# Patient Record
Sex: Female | Born: 1965
Health system: Southern US, Community
[De-identification: ages and names within clinical notes are randomized; demographics above are authoritative.]

## PROBLEM LIST (undated history)

## (undated) DIAGNOSIS — F329 Major depressive disorder, single episode, unspecified: Secondary | ICD-10-CM

## (undated) DIAGNOSIS — F32A Depression, unspecified: Secondary | ICD-10-CM

## (undated) DIAGNOSIS — K219 Gastro-esophageal reflux disease without esophagitis: Secondary | ICD-10-CM

## (undated) DIAGNOSIS — I10 Essential (primary) hypertension: Secondary | ICD-10-CM

## (undated) DIAGNOSIS — G8929 Other chronic pain: Secondary | ICD-10-CM

## (undated) DIAGNOSIS — F419 Anxiety disorder, unspecified: Secondary | ICD-10-CM

## (undated) HISTORY — DX: Depression, unspecified: F32.A

## (undated) HISTORY — DX: Anxiety disorder, unspecified: F41.9

## (undated) HISTORY — PX: TUBAL LIGATION: SHX77

## (undated) HISTORY — DX: Other chronic pain: G89.29

---
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
## 1998-01-06 ENCOUNTER — Other Ambulatory Visit: Admission: RE | Admit: 1998-01-06 | Discharge: 1998-01-06 | Payer: Self-pay | Admitting: Obstetrics & Gynecology

## 1998-01-06 ENCOUNTER — Other Ambulatory Visit: Admission: RE | Admit: 1998-01-06 | Discharge: 1998-01-06 | Payer: Self-pay | Admitting: Obstetrics and Gynecology

## 1998-02-08 ENCOUNTER — Ambulatory Visit (HOSPITAL_COMMUNITY): Admission: RE | Admit: 1998-02-08 | Discharge: 1998-02-08 | Payer: Self-pay | Admitting: Obstetrics and Gynecology

## 1998-05-04 ENCOUNTER — Other Ambulatory Visit: Admission: RE | Admit: 1998-05-04 | Discharge: 1998-05-04 | Payer: Self-pay | Admitting: Obstetrics & Gynecology

## 1998-08-17 ENCOUNTER — Inpatient Hospital Stay (HOSPITAL_COMMUNITY): Admission: AD | Admit: 1998-08-17 | Discharge: 1998-08-19 | Payer: Self-pay | Admitting: Obstetrics and Gynecology

## 1998-09-28 ENCOUNTER — Other Ambulatory Visit: Admission: RE | Admit: 1998-09-28 | Discharge: 1998-09-28 | Payer: Self-pay | Admitting: Obstetrics and Gynecology

## 1998-10-20 ENCOUNTER — Other Ambulatory Visit: Admission: RE | Admit: 1998-10-20 | Discharge: 1998-10-20 | Payer: Self-pay | Admitting: Obstetrics and Gynecology

## 1998-12-09 ENCOUNTER — Other Ambulatory Visit: Admission: RE | Admit: 1998-12-09 | Discharge: 1998-12-09 | Payer: Self-pay | Admitting: Obstetrics and Gynecology

## 1999-03-16 ENCOUNTER — Other Ambulatory Visit: Admission: RE | Admit: 1999-03-16 | Discharge: 1999-03-16 | Payer: Self-pay | Admitting: Obstetrics and Gynecology

## 1999-07-14 ENCOUNTER — Ambulatory Visit (HOSPITAL_COMMUNITY): Admission: RE | Admit: 1999-07-14 | Discharge: 1999-07-14 | Payer: Self-pay | Admitting: *Deleted

## 1999-07-24 ENCOUNTER — Emergency Department (HOSPITAL_COMMUNITY): Admission: EM | Admit: 1999-07-24 | Discharge: 1999-07-24 | Payer: Self-pay | Admitting: Emergency Medicine

## 1999-07-26 ENCOUNTER — Inpatient Hospital Stay (HOSPITAL_COMMUNITY): Admission: AD | Admit: 1999-07-26 | Discharge: 1999-07-28 | Payer: Self-pay | Admitting: *Deleted

## 1999-09-07 ENCOUNTER — Encounter: Payer: Self-pay | Admitting: Emergency Medicine

## 1999-09-07 ENCOUNTER — Emergency Department (HOSPITAL_COMMUNITY): Admission: EM | Admit: 1999-09-07 | Discharge: 1999-09-07 | Payer: Self-pay | Admitting: Emergency Medicine

## 1999-09-20 ENCOUNTER — Emergency Department (HOSPITAL_COMMUNITY): Admission: EM | Admit: 1999-09-20 | Discharge: 1999-09-20 | Payer: Self-pay | Admitting: Emergency Medicine

## 2000-07-25 ENCOUNTER — Other Ambulatory Visit: Admission: RE | Admit: 2000-07-25 | Discharge: 2000-07-25 | Payer: Self-pay | Admitting: Gynecology

## 2000-08-25 ENCOUNTER — Ambulatory Visit (HOSPITAL_COMMUNITY): Admission: RE | Admit: 2000-08-25 | Discharge: 2000-08-25 | Payer: Self-pay | Admitting: Neurology

## 2000-08-25 ENCOUNTER — Encounter: Payer: Self-pay | Admitting: Neurology

## 2001-05-22 ENCOUNTER — Emergency Department (HOSPITAL_COMMUNITY): Admission: EM | Admit: 2001-05-22 | Discharge: 2001-05-22 | Payer: Self-pay

## 2003-06-02 ENCOUNTER — Emergency Department (HOSPITAL_COMMUNITY): Admission: EM | Admit: 2003-06-02 | Discharge: 2003-06-02 | Payer: Self-pay | Admitting: Emergency Medicine

## 2005-08-30 ENCOUNTER — Ambulatory Visit: Payer: Self-pay | Admitting: Family Medicine

## 2005-09-13 ENCOUNTER — Ambulatory Visit: Payer: Self-pay | Admitting: Family Medicine

## 2005-10-10 ENCOUNTER — Ambulatory Visit (HOSPITAL_COMMUNITY): Admission: RE | Admit: 2005-10-10 | Discharge: 2005-10-10 | Payer: Self-pay | Admitting: Family Medicine

## 2006-04-21 ENCOUNTER — Encounter: Payer: Self-pay | Admitting: Emergency Medicine

## 2006-04-21 ENCOUNTER — Inpatient Hospital Stay (HOSPITAL_COMMUNITY): Admission: AD | Admit: 2006-04-21 | Discharge: 2006-04-23 | Payer: Self-pay | Admitting: Obstetrics

## 2006-04-22 ENCOUNTER — Encounter (INDEPENDENT_AMBULATORY_CARE_PROVIDER_SITE_OTHER): Payer: Self-pay | Admitting: Specialist

## 2006-07-10 ENCOUNTER — Ambulatory Visit: Payer: Self-pay | Admitting: Family Medicine

## 2006-12-12 ENCOUNTER — Emergency Department (HOSPITAL_COMMUNITY): Admission: EM | Admit: 2006-12-12 | Discharge: 2006-12-12 | Payer: Self-pay | Admitting: Emergency Medicine

## 2007-05-05 ENCOUNTER — Emergency Department (HOSPITAL_COMMUNITY): Admission: EM | Admit: 2007-05-05 | Discharge: 2007-05-05 | Payer: Self-pay | Admitting: Emergency Medicine

## 2008-05-29 ENCOUNTER — Emergency Department (HOSPITAL_COMMUNITY): Admission: EM | Admit: 2008-05-29 | Discharge: 2008-05-29 | Payer: Self-pay | Admitting: Emergency Medicine

## 2009-12-25 ENCOUNTER — Emergency Department (HOSPITAL_COMMUNITY): Admission: EM | Admit: 2009-12-25 | Discharge: 2009-12-25 | Payer: Self-pay | Admitting: Family Medicine

## 2010-03-06 ENCOUNTER — Emergency Department (HOSPITAL_COMMUNITY): Admission: EM | Admit: 2010-03-06 | Discharge: 2010-03-06 | Payer: Self-pay | Admitting: Emergency Medicine

## 2010-03-15 ENCOUNTER — Encounter: Admission: RE | Admit: 2010-03-15 | Discharge: 2010-03-15 | Payer: Self-pay | Admitting: Family Medicine

## 2010-08-30 ENCOUNTER — Emergency Department (HOSPITAL_COMMUNITY): Payer: Medicaid Other

## 2010-08-30 ENCOUNTER — Emergency Department (HOSPITAL_COMMUNITY)
Admission: EM | Admit: 2010-08-30 | Discharge: 2010-08-30 | Disposition: A | Discharge status: Home / Self Care | Payer: Medicaid Other | Attending: Emergency Medicine | Admitting: Emergency Medicine

## 2010-08-30 DIAGNOSIS — R112 Nausea with vomiting, unspecified: Secondary | ICD-10-CM | POA: Insufficient documentation

## 2010-08-30 DIAGNOSIS — R197 Diarrhea, unspecified: Secondary | ICD-10-CM | POA: Insufficient documentation

## 2010-08-30 DIAGNOSIS — G40909 Epilepsy, unspecified, not intractable, without status epilepticus: Secondary | ICD-10-CM | POA: Insufficient documentation

## 2010-08-30 DIAGNOSIS — N760 Acute vaginitis: Secondary | ICD-10-CM | POA: Insufficient documentation

## 2010-08-30 DIAGNOSIS — R10813 Right lower quadrant abdominal tenderness: Secondary | ICD-10-CM | POA: Insufficient documentation

## 2010-08-30 DIAGNOSIS — R109 Unspecified abdominal pain: Secondary | ICD-10-CM | POA: Insufficient documentation

## 2010-08-30 DIAGNOSIS — F17200 Nicotine dependence, unspecified, uncomplicated: Secondary | ICD-10-CM | POA: Insufficient documentation

## 2010-08-30 LAB — CBC
MCV: 86.5 fL (ref 78.0–100.0)
Platelets: 246 10*3/uL (ref 150–400)
RBC: 4.59 MIL/uL (ref 3.87–5.11)
RDW: 14 % (ref 11.5–15.5)
WBC: 12.4 10*3/uL — ABNORMAL HIGH (ref 4.0–10.5)

## 2010-08-30 LAB — DIFFERENTIAL
Lymphocytes Relative: 38 % (ref 12–46)
Monocytes Absolute: 1.3 10*3/uL — ABNORMAL HIGH (ref 0.1–1.0)
Neutrophils Relative %: 49 % (ref 43–77)

## 2010-08-30 LAB — URINALYSIS, ROUTINE W REFLEX MICROSCOPIC
Hgb urine dipstick: NEGATIVE
Ketones, ur: 15 mg/dL — AB
Nitrite: NEGATIVE
Protein, ur: NEGATIVE mg/dL
Urine Glucose, Fasting: NEGATIVE mg/dL
Urobilinogen, UA: 1 mg/dL (ref 0.0–1.0)

## 2010-08-30 LAB — WET PREP, GENITAL
Trich, Wet Prep: NONE SEEN
Yeast Wet Prep HPF POC: NONE SEEN

## 2010-08-30 LAB — BASIC METABOLIC PANEL
BUN: 8 mg/dL (ref 6–23)
CO2: 27 mEq/L (ref 19–32)
Calcium: 9 mg/dL (ref 8.4–10.5)
Chloride: 100 mEq/L (ref 96–112)
GFR calc Af Amer: >60 mL/min (ref >60)
Potassium: 3.6 mEq/L (ref 3.5–5.1)

## 2010-08-30 LAB — HEPATIC FUNCTION PANEL
Alkaline Phosphatase: 76 U/L (ref 39–117)
Bilirubin, Direct: <0.1 mg/dL (ref 0.0–0.3)

## 2010-08-30 LAB — POCT PREGNANCY, URINE: Preg Test, Ur: NEGATIVE

## 2010-08-30 MED ORDER — IOHEXOL 300 MG/ML  SOLN
80.0000 mL | Freq: Once | INTRAMUSCULAR | Status: AC | PRN
  Administered 2010-08-30: 80 mL via INTRAVENOUS

## 2010-08-31 LAB — GC/CHLAMYDIA PROBE AMP, GENITAL: GC Probe Amp, Genital: NEGATIVE

## 2010-09-14 LAB — CBC
HCT: 38 % (ref 36.0–46.0)
Hemoglobin: 12.3 g/dL (ref 12.0–15.0)
MCHC: 32.4 g/dL (ref 30.0–36.0)
MCV: 87.6 fL (ref 78.0–100.0)
RBC: 4.34 MIL/uL (ref 3.87–5.11)
RDW: 13.7 % (ref 11.5–15.5)
WBC: 8.6 10*3/uL (ref 4.0–10.5)

## 2010-09-14 LAB — COMPREHENSIVE METABOLIC PANEL
ALT: 11 U/L (ref 0–35)
Alkaline Phosphatase: 70 U/L (ref 39–117)
Calcium: 8.4 mg/dL (ref 8.4–10.5)
Creatinine, Ser: 0.61 mg/dL (ref 0.4–1.2)
GFR calc non Af Amer: >60 mL/min (ref >60)
Glucose, Bld: 107 mg/dL — ABNORMAL HIGH (ref 70–99)
Potassium: 3.5 mEq/L (ref 3.5–5.1)
Sodium: 137 mEq/L (ref 135–145)
Total Protein: 6.2 g/dL (ref 6.0–8.3)

## 2010-09-14 LAB — ABO/RH: ABO/RH(D): A POS

## 2010-09-14 LAB — URINALYSIS, ROUTINE W REFLEX MICROSCOPIC
Glucose, UA: NEGATIVE mg/dL
Ketones, ur: 15 mg/dL — AB
Nitrite: NEGATIVE
Specific Gravity, Urine: 1.015 (ref 1.005–1.030)
pH: 6 (ref 5.0–8.0)

## 2010-09-14 LAB — TYPE AND SCREEN: Antibody Screen: NEGATIVE

## 2010-09-14 LAB — PROTIME-INR: Prothrombin Time: 13.3 seconds (ref 11.6–15.2)

## 2010-09-14 LAB — LACTIC ACID, PLASMA: Lactic Acid, Venous: 0.7 mmol/L (ref 0.5–2.2)

## 2010-09-14 LAB — POCT I-STAT, CHEM 8
BUN: 7 mg/dL (ref 6–23)
Chloride: 104 mEq/L (ref 96–112)
Creatinine, Ser: 0.6 mg/dL (ref 0.4–1.2)
Sodium: 139 mEq/L (ref 135–145)
TCO2: 24 mmol/L (ref 0–100)

## 2010-09-14 LAB — APTT: aPTT: 25 seconds (ref 24–37)

## 2010-09-14 LAB — POCT PREGNANCY, URINE: Preg Test, Ur: NEGATIVE

## 2010-11-17 NOTE — Discharge Summary (Signed)
Ashley [name], Ashley [name]                ACCOUNT NO.:  [id number]   MEDICAL RECORD NO.:  [id number]          PATIENT TYPE:  INP   LOCATION:  9372                          FACILITY:  [facility]   PHYSICIAN:  Charles A. Clearance Coots, M.D.DATE OF BIRTH:  1966/06/11   DATE OF ADMISSION:  04/21/2006  DATE OF DISCHARGE:  04/23/2006                                 DISCHARGE SUMMARY   No dictation for this job      Charles A. Clearance Coots, M.D.  Electronically Signed     CAH/MEDQ  D:  04/23/2006  T:  04/24/2006  Job:  [id number]

## 2010-11-17 NOTE — Discharge Summary (Signed)
. [facility]  Patient:    Ashley [name]                       MRN: [id number] Adm. Date:  [id number] Disc. Date: [id number] Attending:  Sharyn Dross                           Discharge Summary  ADMITTING DIAGNOSIS:  Weight loss, diarrhea, and abdominal pain.  DISCHARGE DIAGNOSIS: Irritable bowel syndrome.  CONDITION AT TIME OF DISCHARGE:  Stable and improved.  DISCHARGE MEDICATIONS:  To resume previous home meds such as hyoscyamine 0.375 g b.i.d. for abdominal pain and spasms.  FOLLOWUP:  To follow up with me in approximately two weeks.  COMPLICATIONS:  None.  HOSPITAL COURSE:  The patient was brought in for 23 hour observation and admission because of recurrent and persistent abdominal pain associated with weight loss,  nausea and vomiting, as well as diarrhea.  She had underwent an limited colonoscopic examination, which showed no evidence of stool in the colon region at that time.  She has had this problem many years before due to increased stress hat was ongoing.  The symptoms started to recur at this time.  She came into the hospital in the evening when an IV was started at that time.  There was no evidence of any ongoing diarrhea or any vomiting that occurred during the first 24 hours of her hospital stay at this time.  Routine laboratory data as ordered and the results were normal except for an albumin level, which was 2.8,  that was noted.  This albumin level could not be explained based under normal condition unless a prealbumin level was ordered for the patient to determine if the patient was malnourished or having any complications noted from that standpoint.  The prealbumin level was ordered, but subsequently it took a lengthy period for it to return.  When the results came back, they showed a prealbumin level of 28, which is well within the normal limits.  The patient was discharged to home at this time to resume  her previous meds at his time.  We discussed the alternative types of medications which can be used and ill be used in the future once they are available.  The patient is fully aware of this and does understand this process that is ongoing.  No medication tentatively will be used for increased stressful situations until the patient follows up with me in the office for reevaluation.   Conservative management during the interim will e obtained.  The patient fully understands this and has agreed to attempt this today at the time of being discharged.  FINAL DIAGNOSIS:  As above.  CLINICAL CONDITION:  Stable and improved. DD:  07/28/99 TD:  07/30/99 Job: [id number]

## 2010-11-17 NOTE — Op Note (Signed)
[facility]  Patient:    Ashley [name]                         MRN: [id number] Proc. Date: 07/14/99 Adm. Date:  [id number] Attending:  Sharyn Dross                           Operative Report  PREOPERATIVE DIAGNOSIS:  Hematemesis.  POSTOPERATIVE DIAGNOSIS:  Mild gastritis changes in the gastric body that was noted.  OPERATION:  Esophagogastroduodenoscopy.  SURGEON:  Sharyn Dross., M.D.  PREMEDICATION:  Demerol 100 mg IV and Versed 10 mg IV over a 10 minute period of time.  INSTRUMENTS:  Olympus video panendoscope.  INDICATION:  This pleasant 45 year old white female contacted the office because of bringing up blood that was present.  She states it was small amounts of blood, ut when questioned about it, she said that she had wretching sensation with the prospects of this reddish material coming out.  She denies any history of any previous ulcer disease but has a history of chest burning that was present at this time.  There was no history of any gastroesophageal reflux problems that was noted in the past.  PHYSICAL EXAMINATION:  GENERAL:  She is a pleasant female in no distress.  VITAL SIGNS:  Stable.  HEENT:  Anicteric.  NECK:  Supple.  LUNGS:  Clear.  HEART:  Regular rate and rhythm without heaves, thrills, murmurs, or gallops.  ABDOMEN:  Soft.  No tenderness.  No hepatosplenomegaly that is appreciated at this time.  EXTREMITIES:  No CCE.  PLAN:  I am going to proceed with the endoscopic examination.  INFORMED CONSENT:  The patient was advised of the procedure, indications, and the risks involved.  The patient has agreed to have the procedure performed at this  time.  DESCRIPTION OF PROCEDURE:  The patient was brought to the endoscopy unit where n IV for IV conscious sedating medication was started.  The monitor was placed on the patient to monitor the patients vital signs and oxygen saturation.   Nasal oxygen at two liters per minute was used.  After adequate sedation was performed, the procedure was begun.  The instrument was advanced to the patient in the left lateral position via direct technique without difficulty.  The oropharyngeal, epiglottis, vocal cords, and piriform sinuses appeared to be grossly within normal limits.  The esophagus was normal without any evidence of acute inflammation, ulcerations, hiatal hernia, r varices appreciated.  The gastric area showed a normal mucus lake without any evidence of acute inflammatory changes in the proximal portion of the gastric body; however, in the antral area, there were mild inflammatory changes that were appreciated at this  time.  The antral area and pyloric region appeared to be hyperemic in character at this time but no ulcerations were noted.  The pylorus is normal.  Upon advancing to he pyloral canal, the duodenal bulb and second portion appeared to be within normal limits.  The instrument was retracted back with a biopsy for the CLO study was performed. Retroflex view of the cardia revealed no gross pathology at this time.  The Z-line appeared to be approximately 39 to 40 cm distal to the esophagus.  A photograph was taken of the region.  There was an area of possible inflammation that could have occurred from a slight Mallory-Weiss tear that was noted. Otherwise, there was no  other gross abnormalities that was noted in this region.  The instrument was retracted back into the esophagus where there was no evidence of any postendoscopic trauma noted.  There was no evidence of any residual blood or any complications of bleeding at this time that was noted.  The instrument was subsequently removed per orum without difficulty.  The patient tolerated the procedure well.  TREATMENT:  I will treat the patient with PPI for right now.  Have the patient follow up with me in the office in the next few weeks,  and depending upon the results, we will determine the course of therapy.DD:  07/14/99 TD:  07/14/99 Job: [id number]

## 2010-11-17 NOTE — Op Note (Signed)
Ashley [name], Ashley [name]                ACCOUNT NO.:  [id number]   MEDICAL RECORD NO.:  [id number]          PATIENT TYPE:  INP   LOCATION:  9372                          FACILITY:  [facility]   PHYSICIAN:  Charles A. Clearance Coots, M.D.DATE OF BIRTH:  [birth year]   DATE OF PROCEDURE:  04/23/2006  DATE OF DISCHARGE:  04/23/2006                                 OPERATIVE REPORT   PREOPERATIVE DIAGNOSIS:  Desires sterilization.   POSTOPERATIVE DIAGNOSIS:  Desires sterilization.   PROCEDURE:  Bilateral partial salpingectomy.   SURGEON:  Coral Ceo, M.D.   ANESTHESIA:  General.   ESTIMATED BLOOD LOSS:  Negligible.   COMPLICATIONS:  None.   SPECIMEN:  Approximately 2 cm segments of the right and left fallopian tubes   OPERATION:  The patient was brought to the operating room and after  satisfactory general endotracheal anesthesia.  The abdomen was prepped and  draped in the usual sterile fashion.  A small inferior umbilical incision  was made with a scalpel that was deepened down to the fascia bluntly with  curved Mayo scissors.  Right-angle retractors were placed in the incision  and the fascia was identified and grasped with two Kocher forceps and was  cut transversely with curved Mayo scissors and the fascial incision was  extended to the left and to the right with the curved Mayo scissors.  The  peritoneum was entered bluntly with hemostat and a right-angle retractors  were placed again in the incision.  The right fallopian tube was identified  from the cornual and to the fimbrial and was grasped in the isthmic area of  the tube with a Babcock clamp.  Knuckle of tube beneath the Babcock clamp  was then ligated with a #1 plain catgut and the section of tube above the  knot was excised with Metzenbaum scissors and submitted to pathology for  evaluation.  There was no active bleeding from the tubal stumps and  therefore placed back in their normal anatomic position.  Same procedure was  performed  on the opposite side without complications.  The abdomen was then  closed as follows.  The peritoneum and fascia was closed as one with a  continuous suture of 2-0 Vicryl.  The skin was closed with a continuous  subcuticular suture of 3-0 Monocryl.  Sterile bandages applied to the  incision closure.  The patient tolerated the procedure well, transported to  recovery room in satisfactory condition.      Charles A. Clearance Coots, M.D.  Electronically Signed     CAH/MEDQ  D:  04/23/2006  T:  04/24/2006  Job:  [id number]

## 2010-11-17 NOTE — Discharge Summary (Signed)
NAMESHEREESE, BONNIE                ACCOUNT NO.:  [id number]   MEDICAL RECORD NO.:  [id number]          PATIENT TYPE:  INP   LOCATION:  9372                          [facility]ILITY[facility]  [facility]   PHYSICIAN:  Charles A. Clearance Coots, M.D.DATE OF BIRTH:  1965-10-14   DATE OF ADMISSION:  04/21/2006  DATE OF DISCHARGE:  04/23/2006                                 DISCHARGE SUMMARY   ADMITTING DIAGNOSIS:  1. At [redacted] weeks gestation.  2. Seizure with spontaneous rupture of membranes.  3. Early labor.   DISCHARGE DIAGNOSIS:  1. At [redacted] weeks gestation.  2. Seizure with spontaneous rupture of membranes.  3. Early labor.  4. Status post normal spontaneous vaginal delivery of viable female infant      on April 22, 2006 at 0023, Apgars of 8 at 1 minute, 9 at 5 minutes,      weight of 2185 grams, length of 43 cm.  The patient desired permanent      sterilization and was status post bilateral tubal ligation on April 22, 2006.  Mother and infant discharged home in good condition.   REASON FOR ADMISSION:  A 45 year old white female G3, P2, estimated date of  confinement May 19, 2006, presented to the emergency room at [facility] after a seizure that was witnessed by her husband and a fall  causing contusions to the head and abdomen with subsequent rupture of  membranes and uterine contractions.  The patient presented to the emergency  room at [facility] with uterine contractions and a severe headache and  generalized muscle aches from the fall.  The patient gives a history of  having seizure disorder and was taking Depakote up until two years ago and  stopped taking medications two years ago.  Had been seen by Dr. Avie Echevaria  at [facility], but had not been seen for management of  her seizures in over 3 years.  The patient states that she thought that her  seizure disorder had resolved and she has not had a seizure for over 2 years  off medication.  Prenatal care had been  uncomplicated.   PAST MEDICAL HISTORY:  1. Surgery:  None.  2. Illnesses:  Seizure disorder.   MEDICATIONS:  1. Prenatal vitamins.  2. Depakote for which she stopped taking approximately 2 years ago.   ALLERGIES:  NO KNOWN DRUG ALLERGIES   SOCIAL HISTORY:  Married.  Positive tobacco, negative alcohol or  recreational drug use.   FAMILY HISTORY:  Positive for ovarian cancer and positive for cancer of lung  and adult onset diabetes.   PRENATAL LABORATORIES:  Blood type is B+ with negative antibodies.  She is  rubella immune.  Syphilis nonreactive, hepatitis B surface antigen negative,  HIV negative.   PHYSICAL EXAMINATION:  GENERAL:  Slim white female in mild distress with  headache and muscle ache from contusions.  VITAL SIGNS:  Temperature 98, pulse 84, blood pressure 109/66.  HEENT: Within normal limits.  LUNGS:  Clear to auscultation bilaterally.  HEART:  Regular rate and rhythm.  ABDOMEN:  Gravid, nontender.  Cervix 2 cm dilated, 100% effaced and vertex  +1 station.  NEUROLOGICAL:  Intact.   IMPRESSION:  Thirty six weeks gestation, seizure with fall and contusions to  the head and abdomen with subsequent spontaneous rupture of membranes and  early labor, multiparity desiring permanent sterilization.   PLAN:  Continue rule out of intracranial bleed with CT scan of the head then  transfer patient to [facility] for delivery.   ADMITTING LABORATORY:  Hemoglobin 11.8, hematocrit 34.1, white blood cell  count 16,400, platelets 158,000.  RPR was nonreactive.  Comprehensive  metabolic panel was within normal limits.   HOSPITAL COURSE:  The patient ruled out intracranial bleed with a negative  CT scan and transferred to [facility] where she progressed quite  rapidly to normal spontaneous vaginal delivery of a viable female infant  without complications.  The patient was desirous of permanent sterilization,  and was taken to the operating room on postpartum day  zero for tubal  ligation.  Bilateral partial salpingectomy was performed without  complications.  The remainder of the post partum and postoperative course  was uncomplicated.  The patient was discharged home on postpartum day #1 in  good condition.  She had been started on Dilantin for seizure prophylaxis  and an appointment was made for continued follow-up of her seizure disorder  at [facility].   DISCHARGE LABORATORY VALUES:  Hemoglobin 10.9, hematocrit 32, white blood  cell count 16,100, platelets 155,000.   DISCHARGE DISPOSITION:   MEDICATIONS:  1. Dilantin 300 mg daily.  2. Tylox and ibuprofen was prescribed for pain.  3. Continue prenatal vitamins.   FOLLOWUP:  The patient is to follow up in one week at [facility] for a  Dilantin level to be drawn.  She has an appointment that was made at  [facility] with Dr. Avie Echevaria on May 15, 2006,  at 9:30.  Routine written instructions were given for discharge after  vaginal delivery      Charles A. Clearance Coots, M.D.  Electronically Signed     CAH/MEDQ  D:  04/23/2006  T:  04/24/2006  Job:  [id number]

## 2011-03-14 ENCOUNTER — Emergency Department (HOSPITAL_COMMUNITY)
Admission: EM | Admit: 2011-03-14 | Discharge: 2011-03-15 | Disposition: A | Discharge status: Home / Self Care | Payer: Self-pay | Attending: Emergency Medicine | Admitting: Emergency Medicine

## 2011-03-14 DIAGNOSIS — R569 Unspecified convulsions: Secondary | ICD-10-CM | POA: Insufficient documentation

## 2011-03-14 DIAGNOSIS — R109 Unspecified abdominal pain: Secondary | ICD-10-CM | POA: Insufficient documentation

## 2011-03-15 LAB — POCT I-STAT, CHEM 8
Chloride: 105 mEq/L (ref 96–112)
Glucose, Bld: 111 mg/dL — ABNORMAL HIGH (ref 70–99)
HCT: 43 % (ref 36.0–46.0)
Hemoglobin: 14.6 g/dL (ref 12.0–15.0)
Potassium: 3.5 mEq/L (ref 3.5–5.1)
Sodium: 138 mEq/L (ref 135–145)

## 2011-04-04 LAB — POCT I-STAT, CHEM 8
BUN: 5 — ABNORMAL LOW
Calcium, Ion: 1.22
Chloride: 106
HCT: 46
Sodium: 141

## 2011-04-10 LAB — I-STAT 8, (EC8 V) (CONVERTED LAB)
Acid-base deficit: 3 — ABNORMAL HIGH
Bicarbonate: 22.6
Glucose, Bld: 97
Hemoglobin: 15
Sodium: 137
TCO2: 24
pH, Ven: 7.339 — ABNORMAL HIGH

## 2011-04-10 LAB — POCT I-STAT CREATININE
Creatinine, Ser: 0.7
Operator id: 151321

## 2011-06-14 IMAGING — CR DG TIBIA/FIBULA 2V*L*
2 series · 2 of 2 positions shown · non-contrast
Comparison: None.

CLINICAL DATA: Pain and left lower extremity following blunt trauma several days
ago

LEFT TIBIA AND FIBULA - 2 VIEW

[view not recorded (1 of 2)]
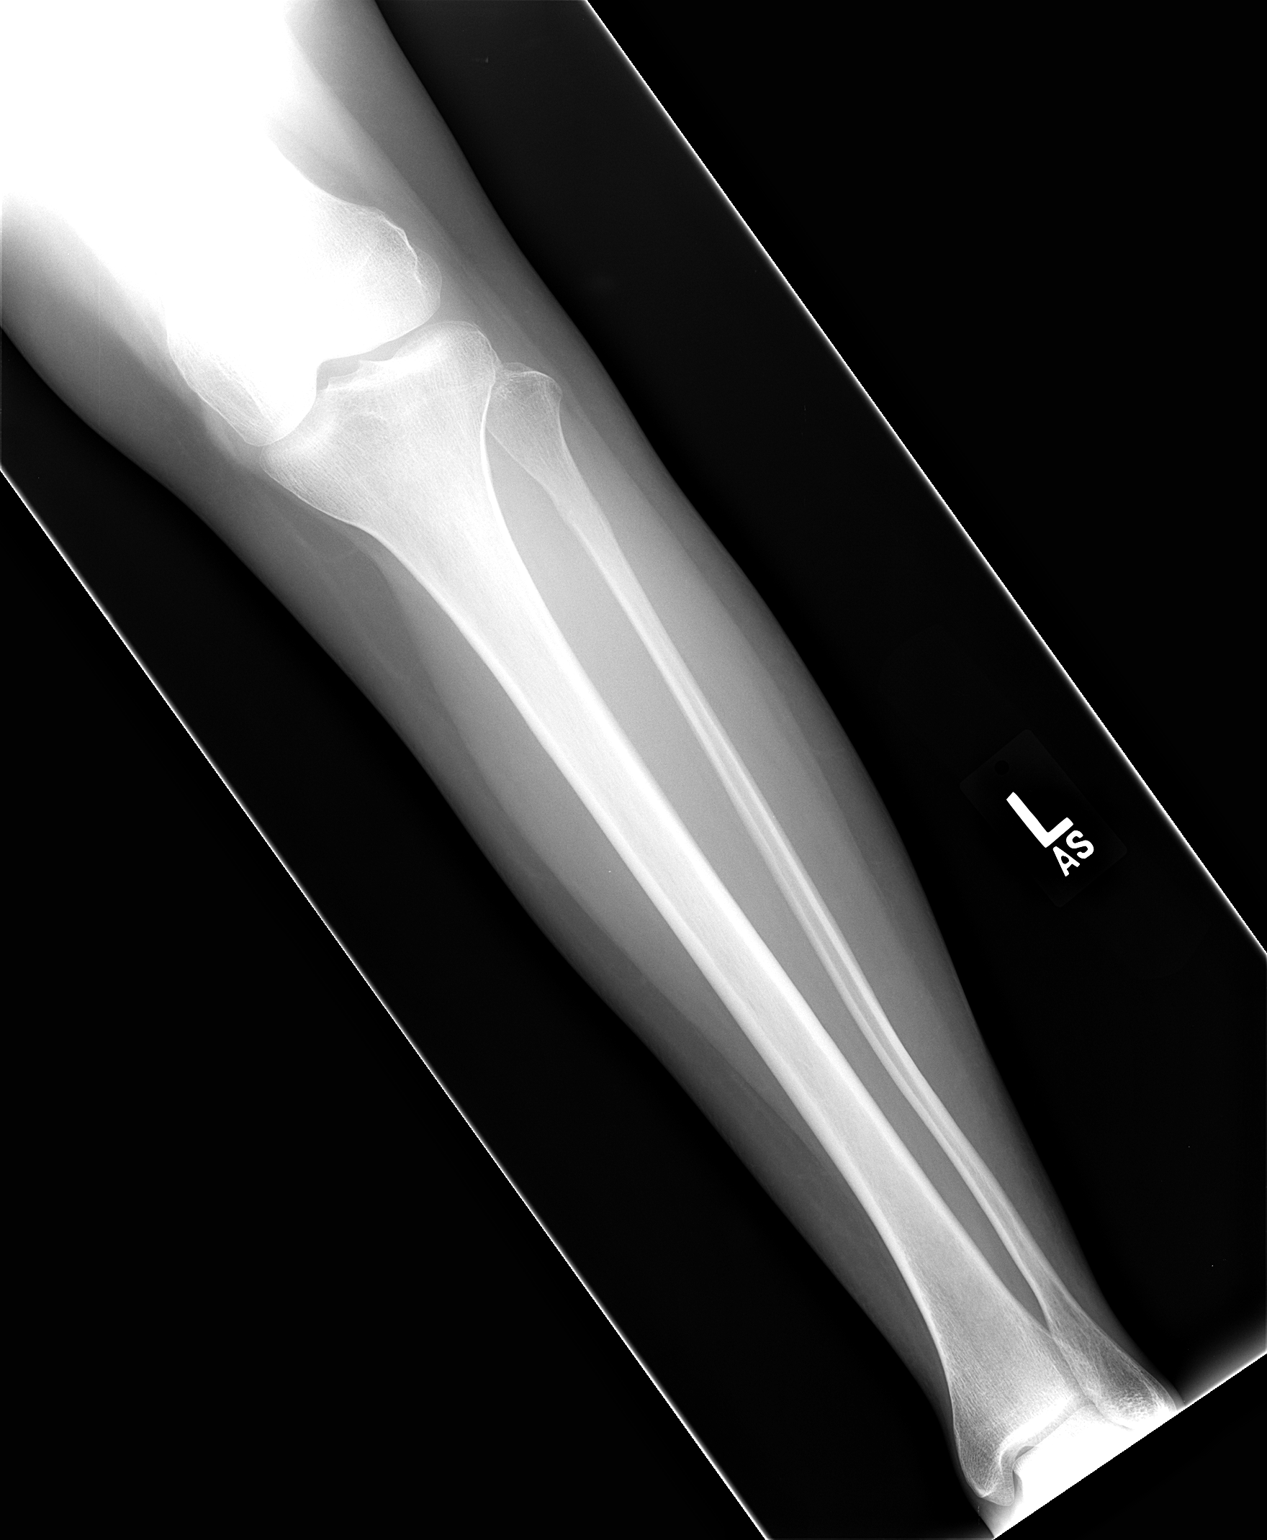

[view not recorded (2 of 2)]
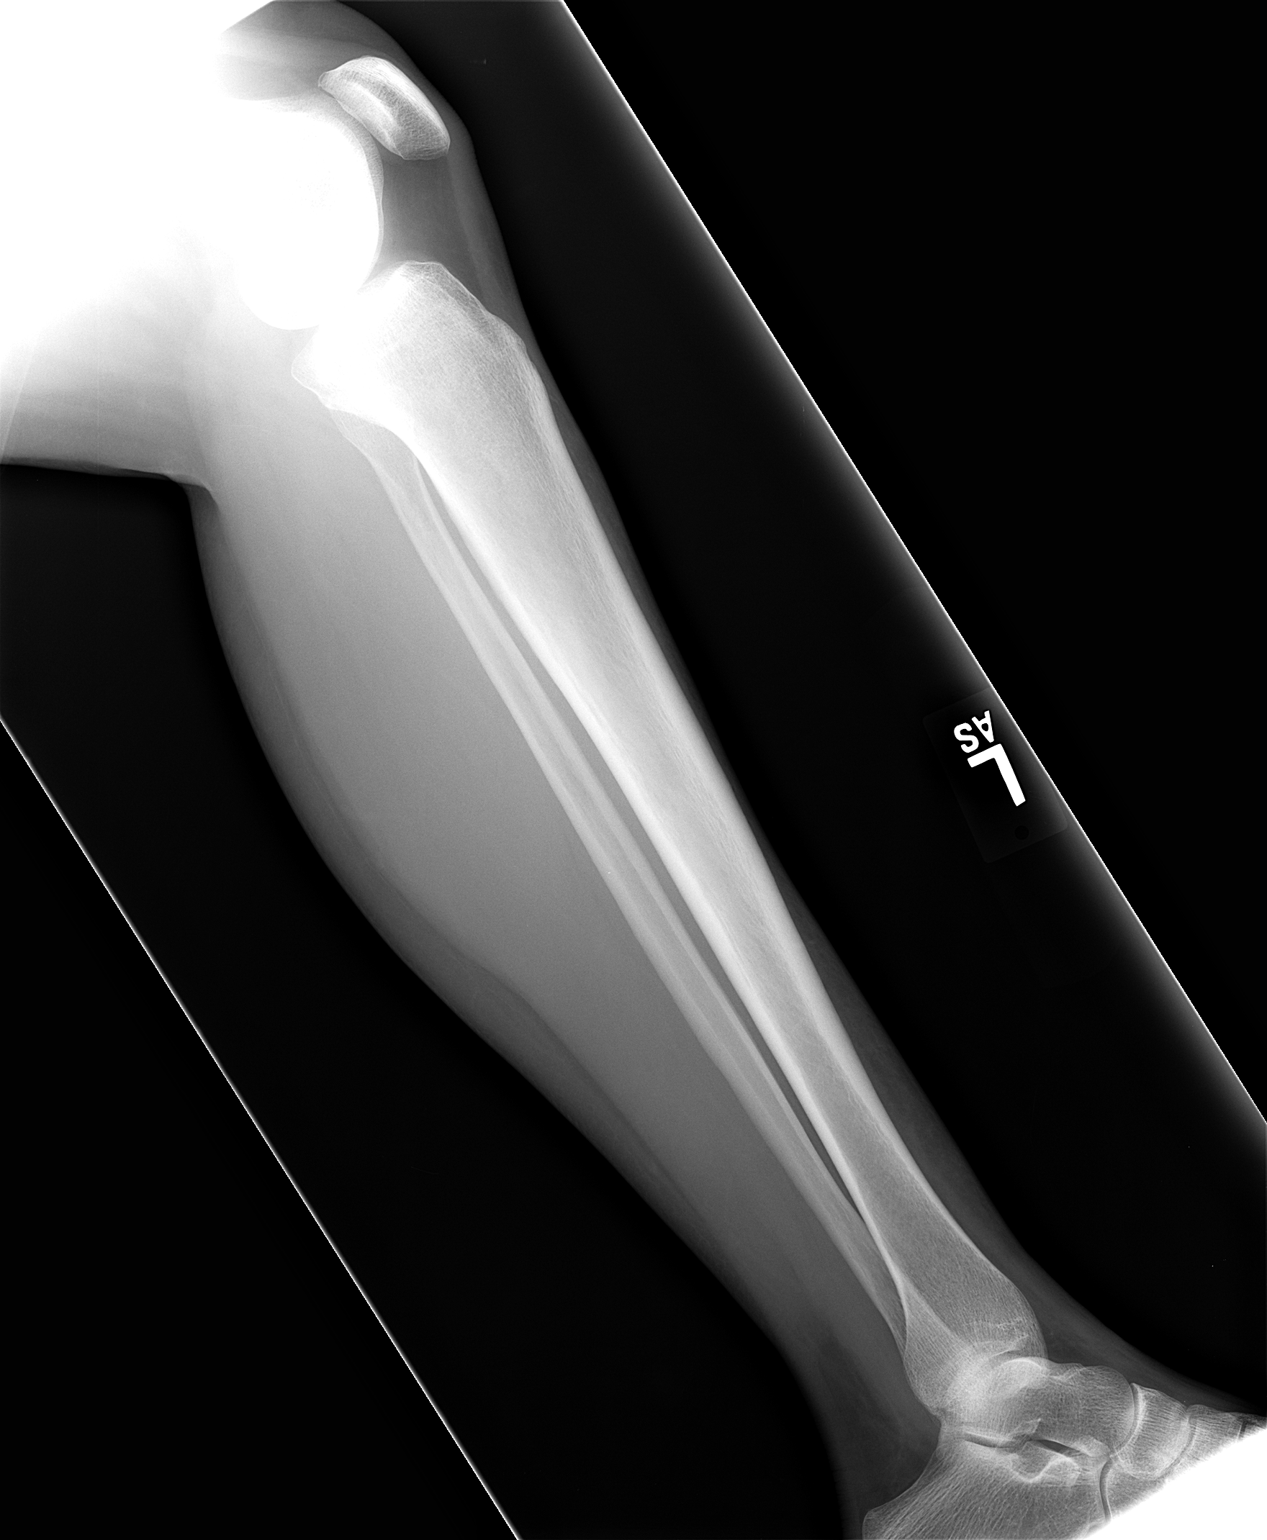

[2 of 2 positions shown; findings below may reference images not displayed]

FINDINGS: No fracture or dislocation.  No specific arthropathy. No
foreign body or other abnormality of the soft tissues.
IMPRESSION: No acute or significant findings.

## 2011-06-14 IMAGING — CR DG ANKLE COMPLETE 3+V*L*
3 series · 3 of 3 positions shown · non-contrast
Comparison: None.

CLINICAL DATA: Trauma to left ankle and calf several days ago.

LEFT ANKLE COMPLETE - 3+ VIEW

[view not recorded (1 of 3)]
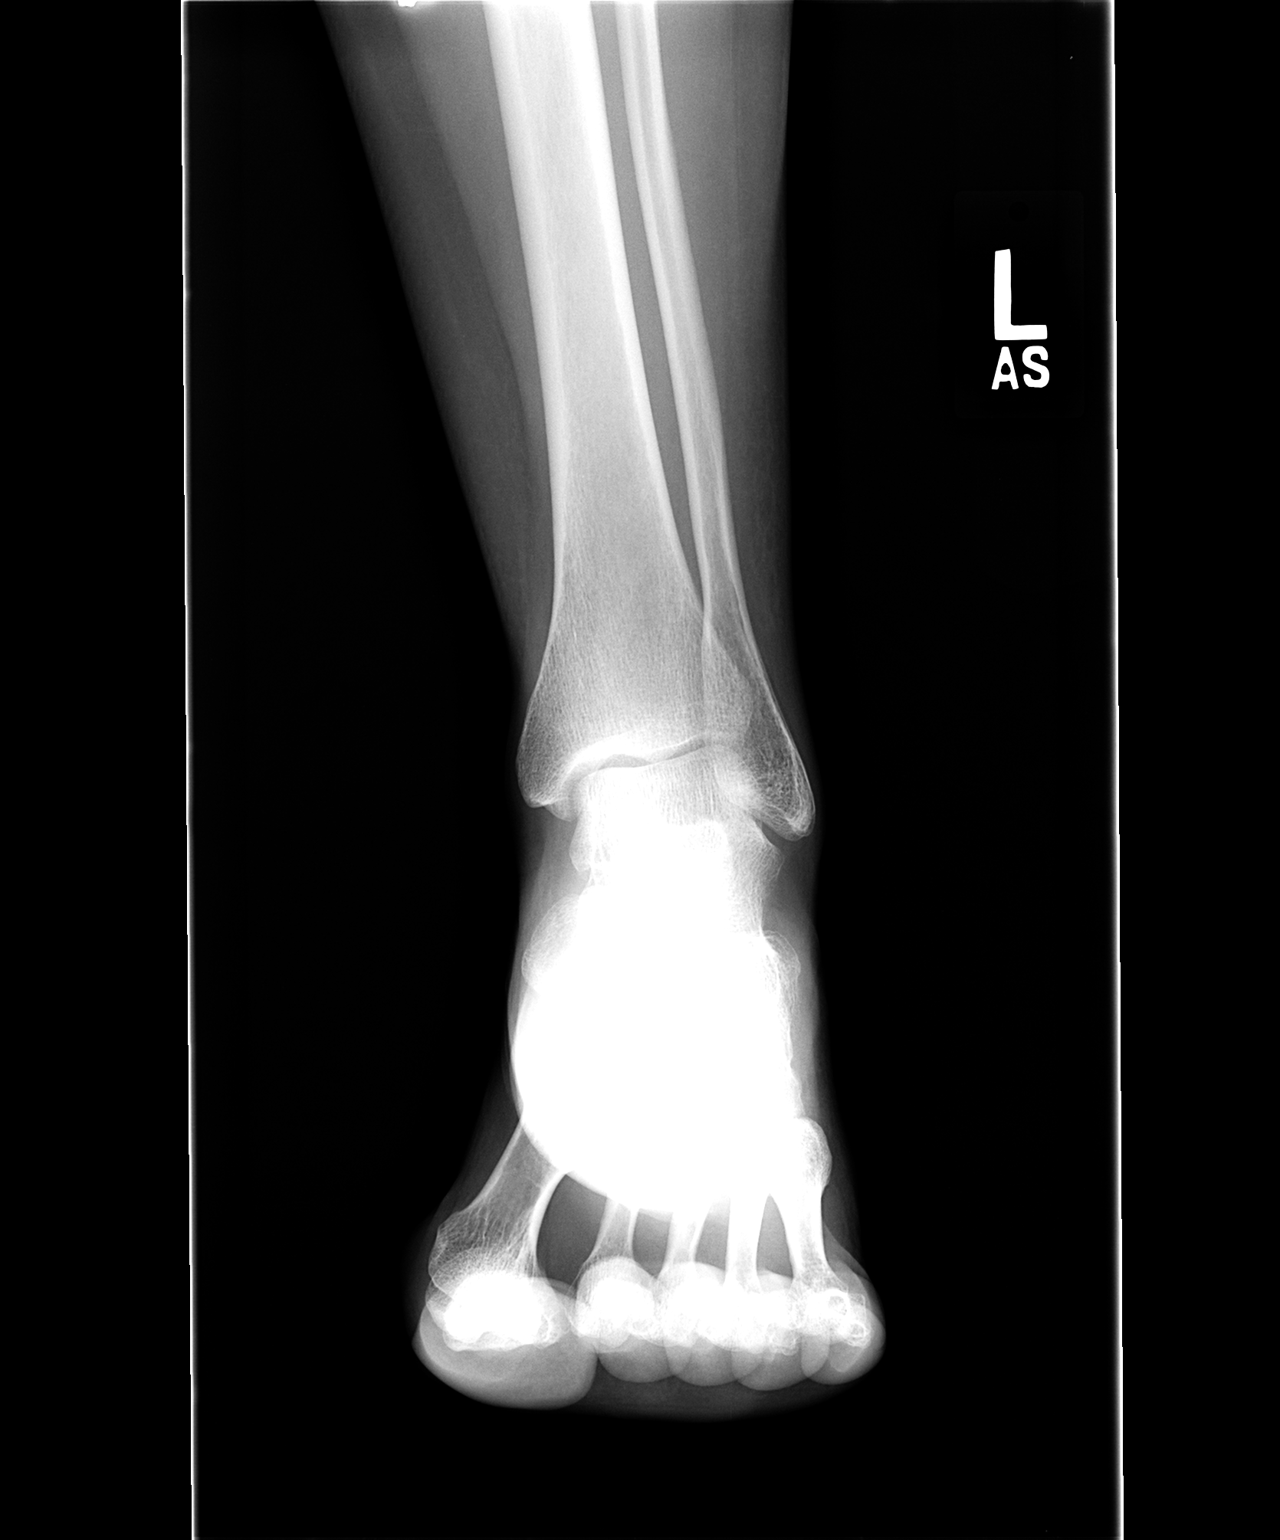

[view not recorded (2 of 3)]
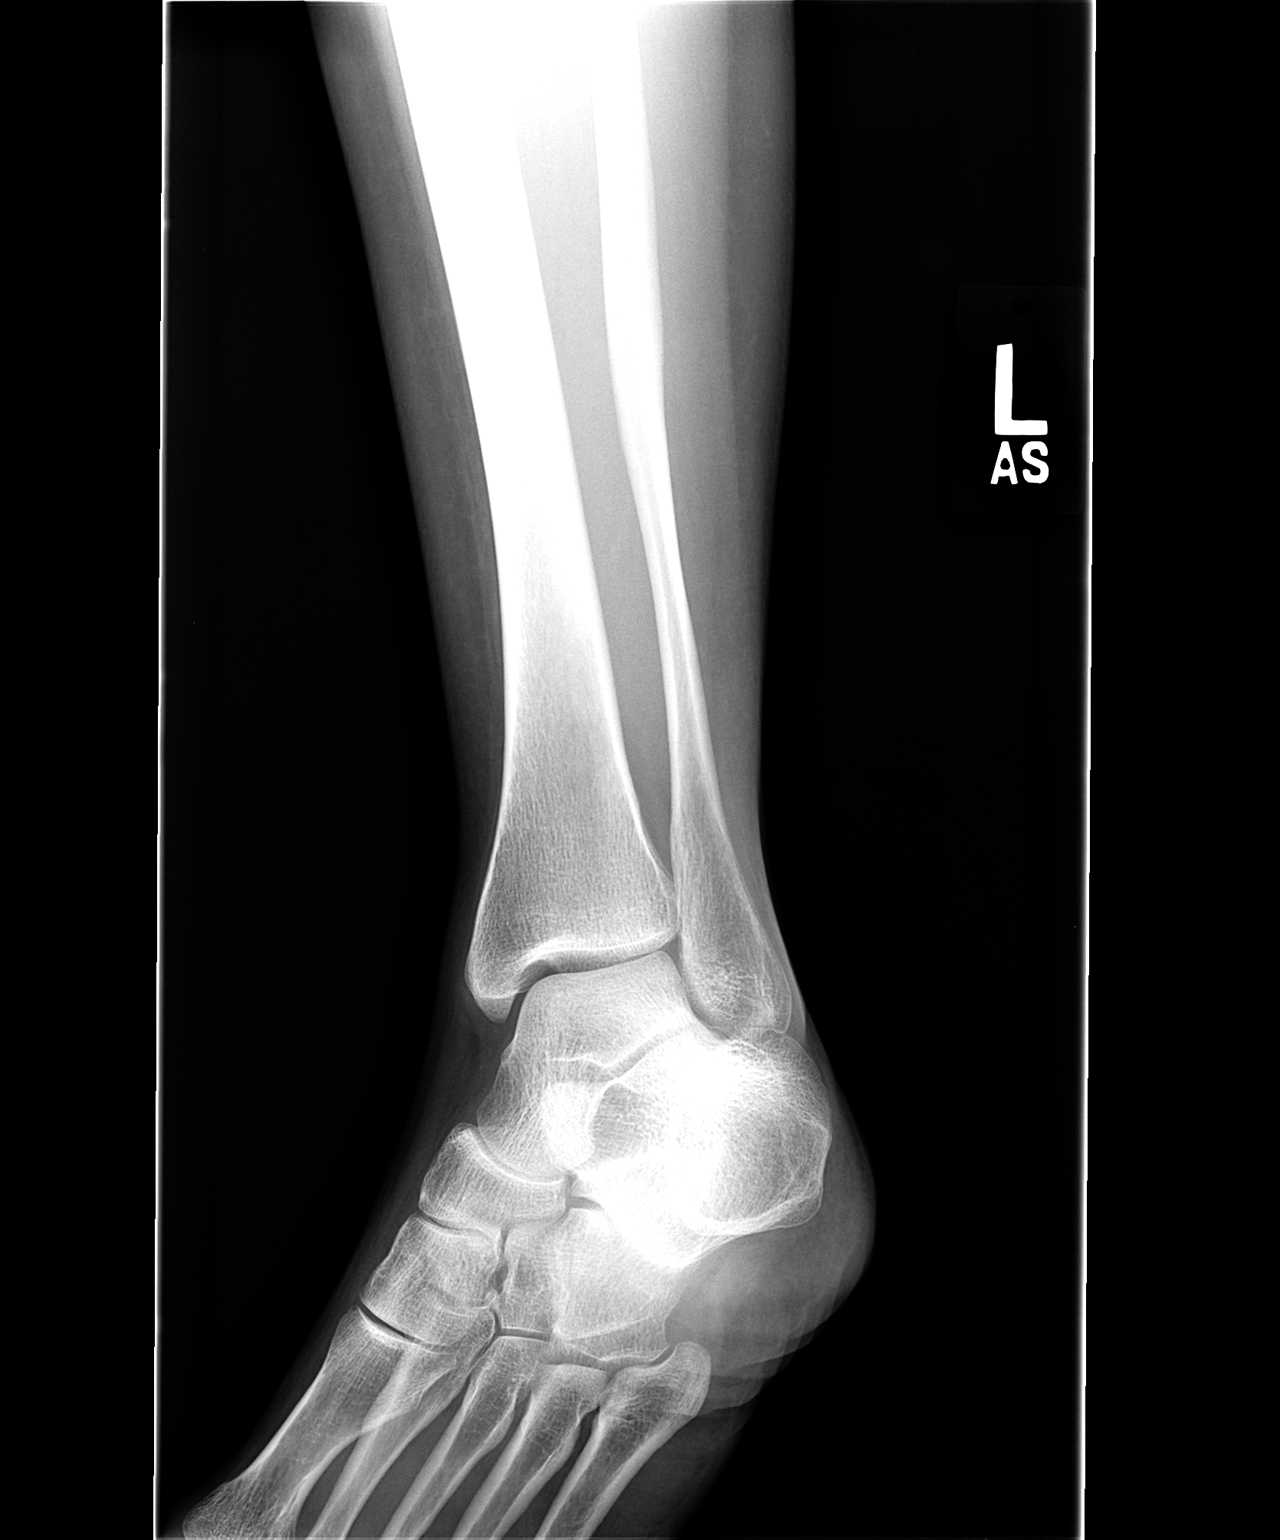

[view not recorded (3 of 3)]
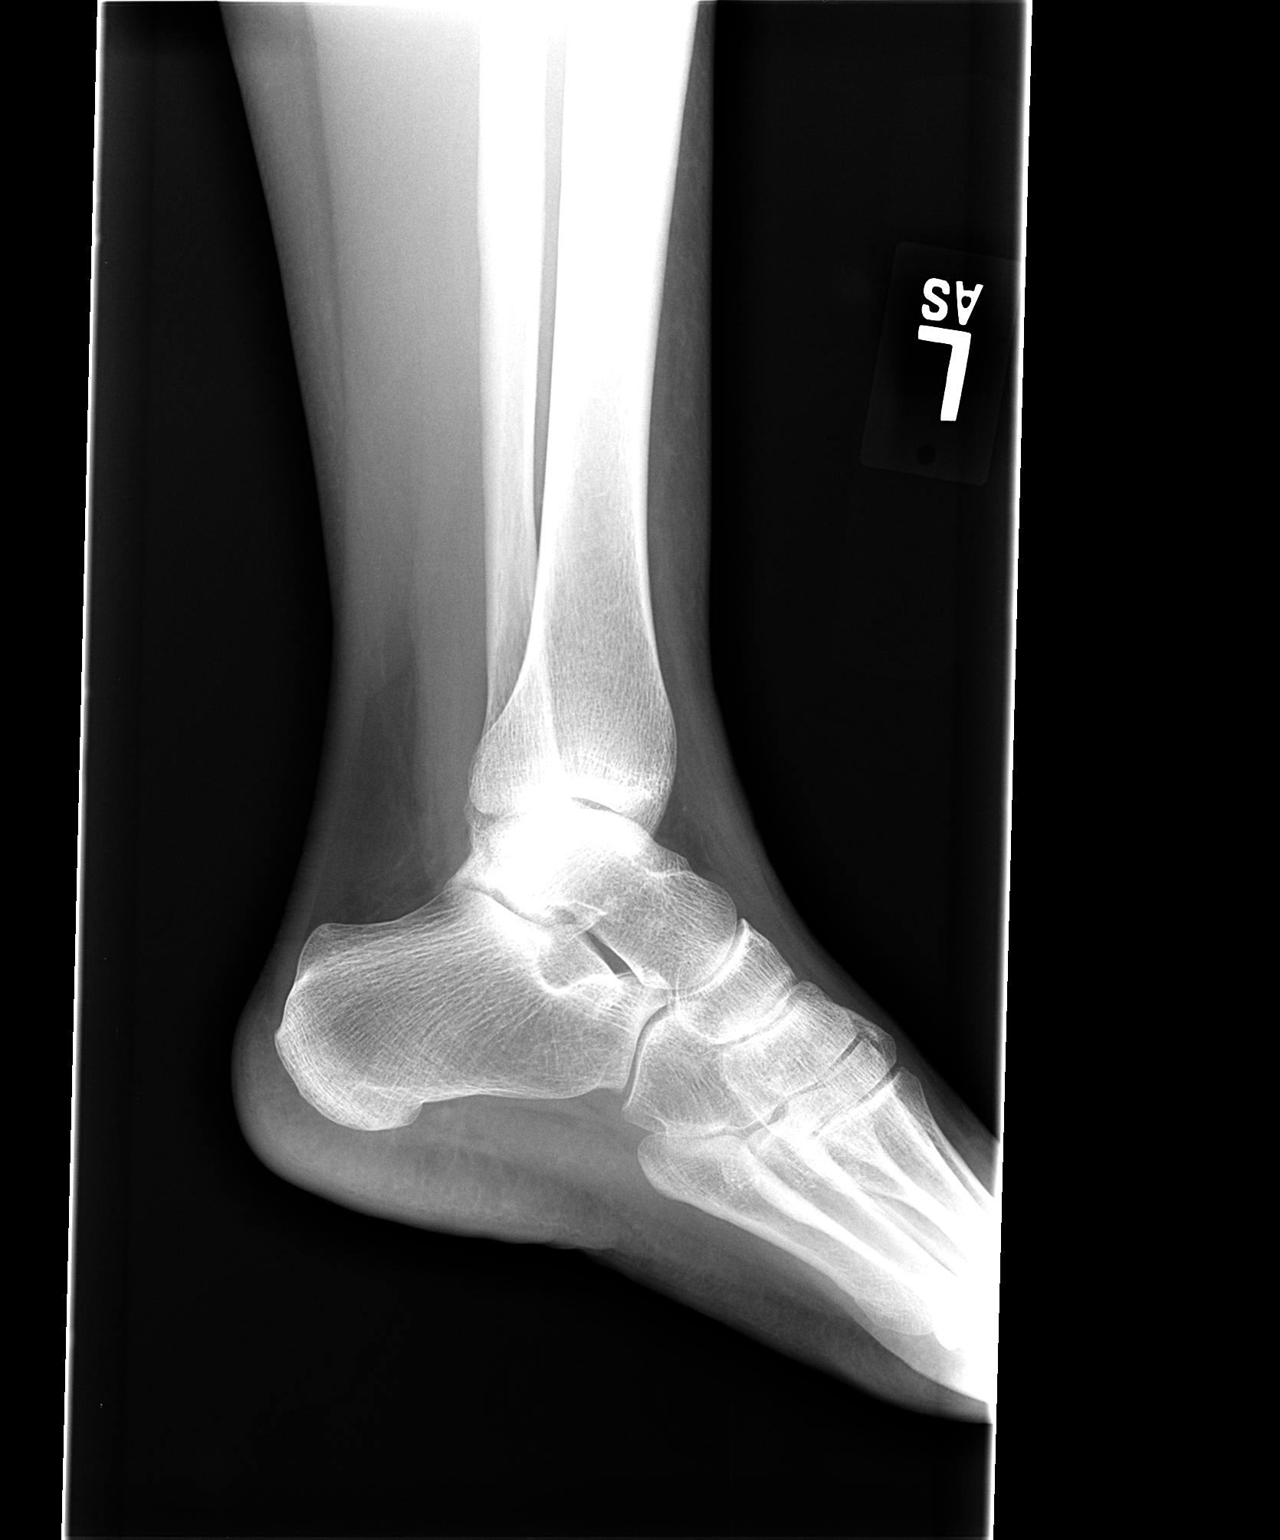

[3 of 3 positions shown; findings below may reference images not displayed]

FINDINGS: No fracture or dislocation.  No specific arthropathy. No
foreign body or other abnormality of the soft tissues.
IMPRESSION: No acute or significant findings.

## 2012-02-18 ENCOUNTER — Emergency Department (HOSPITAL_COMMUNITY)
Admission: EM | Admit: 2012-02-18 | Discharge: 2012-02-18 | Disposition: A | Discharge status: Home / Self Care | Payer: Self-pay | Attending: Emergency Medicine | Admitting: Emergency Medicine

## 2012-02-18 ENCOUNTER — Encounter (HOSPITAL_COMMUNITY): Payer: Self-pay | Admitting: *Deleted

## 2012-02-18 DIAGNOSIS — L02419 Cutaneous abscess of limb, unspecified: Secondary | ICD-10-CM

## 2012-02-18 DIAGNOSIS — F17200 Nicotine dependence, unspecified, uncomplicated: Secondary | ICD-10-CM | POA: Insufficient documentation

## 2012-02-18 DIAGNOSIS — IMO0002 Reserved for concepts with insufficient information to code with codable children: Secondary | ICD-10-CM | POA: Insufficient documentation

## 2012-02-18 MED ORDER — SULFAMETHOXAZOLE-TRIMETHOPRIM 800-160 MG PO TABS: 1.0000 | ORAL_TABLET | Freq: Two times a day (BID) | ORAL | Status: AC

## 2012-02-18 MED ORDER — OXYCODONE-ACETAMINOPHEN 5-325 MG PO TABS
2.0000 | ORAL_TABLET | Freq: Once | ORAL | Status: AC
  Administered 2012-02-18: 2 via ORAL
  Filled 2012-02-18: qty 2

## 2012-02-18 MED ORDER — HYDROCODONE-ACETAMINOPHEN 5-325 MG PO TABS: 2.0000 | ORAL_TABLET | ORAL | Status: AC | PRN

## 2012-02-18 MED ORDER — LIDOCAINE-EPINEPHRINE 2 %-1:100000 IJ SOLN
20.0000 mL | Freq: Once | INTRAMUSCULAR | Status: AC
  Administered 2012-02-18: 20 mL
  Filled 2012-02-18: qty 20

## 2012-02-18 MED ORDER — DIAZEPAM 5 MG PO TABS
5.0000 mg | ORAL_TABLET | Freq: Once | ORAL | Status: AC
  Administered 2012-02-18: 5 mg via ORAL
  Filled 2012-02-18: qty 1

## 2012-02-18 MED ORDER — CEPHALEXIN 500 MG PO CAPS: 500.0000 mg | ORAL_CAPSULE | Freq: Four times a day (QID) | ORAL | Status: AC

## 2012-02-18 NOTE — ED Notes (Signed)
Pt has 2 knots under left arm and not draining

## 2012-02-18 NOTE — ED Provider Notes (Signed)
History  This chart was scribed for Glynn Octave, MD by Shari Heritage. The patient was seen in room TR05C/TR05C. Patient's care was started at 0855.     CSN: [id number]  Arrival date & time 02/18/12  4098   First MD Initiated Contact with Patient 02/18/12 [phone]      Chief Complaint  Patient presents with  . Abscess    left arm    The history is provided by the patient. No language interpreter was used.   Ashley [name] is a 46 y.o. female who presents to the Emergency Department complaining of two abscesses to her left axilla with associated moderate to severe pain onset 4 days ago.  She states that one of them drained on its own, but has since returned. Patient says that she has been applying Epsom salts and warm water to the area with minimal relief. She has also taken Ibuprofen with mild pain relief. Patient denies history of abscesses. Her surgical history includes tubal ligation. She is a current everyday smoker.  Past Surgical History  Procedure Date  . Tubal ligation     History  Substance Use Topics  . Smoking status: Current Everyday Smoker  . Smokeless tobacco: Not on file  . Alcohol Use: No    OB History    Grav Para Term Preterm Abortions TAB SAB Ect Mult Living                  Review of Systems A complete 10 system review of systems was obtained and all systems are negative except as noted in the HPI and PMH.   Allergies  Review of patient's allergies indicates no known allergies.  Home Medications  No current outpatient prescriptions on file.  BP 129/80  Pulse 85  Temp 98.4 F (36.9 C) (Oral)  Resp 20  SpO2 99%  Physical Exam  Constitutional: She is oriented to person, place, and time. She appears well-developed and well-nourished.  HENT:  Head: Normocephalic and atraumatic.  Eyes: Pupils are equal, round, and reactive to light.  Cardiovascular: Normal rate and regular rhythm.   Pulmonary/Chest: Effort normal and breath sounds normal.    Musculoskeletal: Normal range of motion.  Neurological: She is alert and oriented to person, place, and time.  Skin: Skin is warm. There is erythema.       3 cm area of erythema and fluctuance to left inferior axilla. 0.5 cm area to superior left axilla that is not fluctuant.  Psychiatric: She has a normal mood and affect. Her behavior is normal.    ED Course  Procedures (including critical care time) DIAGNOSTIC STUDIES: Oxygen Saturation is 99% on room air, normal by my interpretation.    COORDINATION OF CARE: 9:10am- Patient informed of current plan for treatment and evaluation and agrees with plan at this time.   9:57am- INCISION AND DRAINAGE PROCEDURE NOTE: Patient identification was confirmed and verbal consent was obtained. This procedure was performed by Glynn Octave, MD at 9:57 AM. Site: Left inferior axilla Sterile procedures observed yes Needle size: 25 Anesthetic used (type and amt): 1% lidocaine with epinephrine Blade size: 11 Drainage: Copious purulence Complexity: Complex Packing used 1/4 iodoform Site anesthetized, incision made over site, wound drained and explored loculations, rinsed with copious amounts of normal saline, wound packed with sterile gauze, covered with dry, sterile dressing.  Pt tolerated procedure well without complications.  Instructions for care discussed verbally and pt provided with additional written instructions for homecare and f/u.   No diagnosis  found.    MDM  L axilla abscess with cellulitis.  No fevers or systemic symptoms.  Superior area of induration is not fluctuant. Large inferior abscess drained as above.  Abx, warm soaks, recheck in 48 hours      I personally performed the services described in this documentation, which was scribed in my presence.  The recorded information has been reviewed and considered.   Glynn Octave, MD 02/18/12 1539

## 2014-08-18 ENCOUNTER — Emergency Department (HOSPITAL_COMMUNITY)
Admission: EM | Admit: 2014-08-18 | Discharge: 2014-08-18 | Disposition: A | Discharge status: Home / Self Care | Payer: Medicaid Other | Attending: Emergency Medicine | Admitting: Emergency Medicine

## 2014-08-18 DIAGNOSIS — Z79899 Other long term (current) drug therapy: Secondary | ICD-10-CM | POA: Insufficient documentation

## 2014-08-18 DIAGNOSIS — G8929 Other chronic pain: Secondary | ICD-10-CM | POA: Insufficient documentation

## 2014-08-18 DIAGNOSIS — Z720 Tobacco use: Secondary | ICD-10-CM | POA: Insufficient documentation

## 2014-08-18 DIAGNOSIS — F419 Anxiety disorder, unspecified: Secondary | ICD-10-CM | POA: Insufficient documentation

## 2014-08-18 DIAGNOSIS — R1032 Left lower quadrant pain: Secondary | ICD-10-CM | POA: Insufficient documentation

## 2014-08-18 DIAGNOSIS — R197 Diarrhea, unspecified: Secondary | ICD-10-CM | POA: Insufficient documentation

## 2014-08-18 DIAGNOSIS — Z9851 Tubal ligation status: Secondary | ICD-10-CM | POA: Insufficient documentation

## 2014-08-18 DIAGNOSIS — R110 Nausea: Secondary | ICD-10-CM | POA: Insufficient documentation

## 2014-08-18 LAB — URINALYSIS, ROUTINE W REFLEX MICROSCOPIC
Bilirubin Urine: NEGATIVE
GLUCOSE, UA: NEGATIVE mg/dL
KETONES UR: NEGATIVE mg/dL
Leukocytes, UA: NEGATIVE
Nitrite: NEGATIVE
PH: 6 (ref 5.0–8.0)
PROTEIN: NEGATIVE mg/dL
Specific Gravity, Urine: 1.01 (ref 1.005–1.030)
Urobilinogen, UA: 0.2 mg/dL (ref 0.0–1.0)

## 2014-08-18 LAB — CBC WITH DIFFERENTIAL/PLATELET
BASOS ABS: 0 10*3/uL (ref 0.0–0.1)
BASOS PCT: 0 % (ref 0–1)
EOS ABS: 0 10*3/uL (ref 0.0–0.7)
EOS PCT: 0 % (ref 0–5)
HCT: 42 % (ref 36.0–46.0)
HEMOGLOBIN: 14 g/dL (ref 12.0–15.0)
LYMPHS ABS: 2.1 10*3/uL (ref 0.7–4.0)
Lymphocytes Relative: 21 % (ref 12–46)
MCH: 28.6 pg (ref 26.0–34.0)
MCHC: 33.3 g/dL (ref 30.0–36.0)
MCV: 85.9 fL (ref 78.0–100.0)
MONOS PCT: 6 % (ref 3–12)
Monocytes Absolute: 0.6 10*3/uL (ref 0.1–1.0)
NEUTROS PCT: 73 % (ref 43–77)
Neutro Abs: 7.2 10*3/uL (ref 1.7–7.7)
Platelets: 282 10*3/uL (ref 150–400)
RBC: 4.89 MIL/uL (ref 3.87–5.11)
RDW: 12.9 % (ref 11.5–15.5)
WBC: 9.9 10*3/uL (ref 4.0–10.5)

## 2014-08-18 LAB — URINE MICROSCOPIC-ADD ON

## 2014-08-18 LAB — COMPREHENSIVE METABOLIC PANEL
ALBUMIN: 4 g/dL (ref 3.5–5.2)
ALK PHOS: 99 U/L (ref 39–117)
ALT: 11 U/L (ref 0–35)
AST: 13 U/L (ref 0–37)
Anion gap: 7 (ref 5–15)
BILIRUBIN TOTAL: 0.5 mg/dL (ref 0.3–1.2)
BUN: 6 mg/dL (ref 6–23)
CHLORIDE: 106 mmol/L (ref 96–112)
CO2: 26 mmol/L (ref 19–32)
Calcium: 9.1 mg/dL (ref 8.4–10.5)
Creatinine, Ser: 0.58 mg/dL (ref 0.50–1.10)
GFR calc non Af Amer: >90 mL/min (ref >90)
GLUCOSE: 111 mg/dL — AB (ref 70–99)
POTASSIUM: 3.4 mmol/L — AB (ref 3.5–5.1)
SODIUM: 139 mmol/L (ref 135–145)
TOTAL PROTEIN: 6.9 g/dL (ref 6.0–8.3)

## 2014-08-18 LAB — POC URINE PREG, ED: Preg Test, Ur: NEGATIVE

## 2014-08-18 MED ORDER — MORPHINE SULFATE 4 MG/ML IJ SOLN
4.0000 mg | Freq: Once | INTRAMUSCULAR | Status: AC
  Administered 2014-08-18: 4 mg via INTRAVENOUS
  Filled 2014-08-18: qty 1

## 2014-08-18 MED ORDER — ONDANSETRON HCL 4 MG/2ML IJ SOLN
4.0000 mg | Freq: Once | INTRAMUSCULAR | Status: AC
  Administered 2014-08-18: 4 mg via INTRAVENOUS
  Filled 2014-08-18: qty 2

## 2014-08-18 MED ORDER — ALPRAZOLAM 0.5 MG PO TABS: 0.5000 mg | ORAL_TABLET | Freq: Every evening | ORAL | Status: DC | PRN

## 2014-08-18 MED ORDER — HYDROCODONE-ACETAMINOPHEN 5-325 MG PO TABS: ORAL_TABLET | ORAL | Status: DC

## 2014-08-18 NOTE — ED Notes (Signed)
Pt reports left lower abdominal pain for the last several years. States no PCP but has been dealing with the pain as best she can. States pain increased at 0200 this morning, with an episode of diarrhea, brown watery. Denies fever.

## 2014-08-18 NOTE — Discharge Instructions (Signed)
Please read and follow all provided instructions.  Your diagnoses today include:  1. Left lower quadrant pain   2. Anxiety     Tests performed today include:  Blood counts and electrolytes  Blood tests to check liver and kidney function  Blood tests to check pancreas function  Urine test to look for infection and pregnancy (in women)  Vital signs. See below for your results today.   Medications prescribed:   Vicodin (hydrocodone/acetaminophen) - narcotic pain medication  DO NOT drive or perform any activities that require you to be awake and alert because this medicine can make you drowsy. BE VERY CAREFUL not to take multiple medicines containing Tylenol (also called acetaminophen). Doing so can lead to an overdose which can damage your liver and cause liver failure and possibly death.   Xanax - medication for anxiety  Take any prescribed medications only as directed.  Home care instructions:   Follow any educational materials contained in this packet.  Follow-up instructions: Please follow-up with your primary care provider in the next 3 days for further evaluation of your symptoms.    Return instructions:  SEEK IMMEDIATE MEDICAL ATTENTION IF:  The pain does not go away or becomes severe   A temperature above 101F develops   Repeated vomiting occurs (multiple episodes)   The pain becomes localized to portions of the abdomen. The right side could possibly be appendicitis. In an adult, the left lower portion of the abdomen could be colitis or diverticulitis.   Blood is being passed in stools or vomit (bright red or black tarry stools)   You develop chest pain, difficulty breathing, dizziness or fainting, or become confused, poorly responsive, or inconsolable (young children)  If you have any other emergent concerns regarding your health  Additional Information: Abdominal (belly) pain can be caused by many things. Your caregiver performed an examination and  possibly ordered blood/urine tests and imaging (CT scan, x-rays, ultrasound). Many cases can be observed and treated at home after initial evaluation in the emergency department. Even though you are being discharged home, abdominal pain can be unpredictable. Therefore, you need a repeated exam if your pain does not resolve, returns, or worsens. Most patients with abdominal pain don't have to be admitted to the hospital or have surgery, but serious problems like appendicitis and gallbladder attacks can start out as nonspecific pain. Many abdominal conditions cannot be diagnosed in one visit, so follow-up evaluations are very important.  Your vital signs today were: BP 128/80 mmHg   Pulse 104   Temp(Src) 98.5 F (36.9 C) (Oral)   Resp 18   SpO2 100%   LMP  (LMP Unknown) If your blood pressure (bp) was elevated above 135/85 this visit, please have this repeated by your doctor within one month. --------------
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
## 2016-12-18 ENCOUNTER — Encounter (HOSPITAL_COMMUNITY): Payer: Self-pay | Admitting: *Deleted

## 2016-12-18 ENCOUNTER — Emergency Department (HOSPITAL_COMMUNITY)
Admission: EM | Admit: 2016-12-18 | Discharge: 2016-12-18 | Disposition: A | Discharge status: Home / Self Care | Payer: Medicaid Other | Attending: Emergency Medicine | Admitting: Emergency Medicine

## 2016-12-18 DIAGNOSIS — M5430 Sciatica, unspecified side: Secondary | ICD-10-CM

## 2016-12-18 DIAGNOSIS — F17200 Nicotine dependence, unspecified, uncomplicated: Secondary | ICD-10-CM | POA: Insufficient documentation

## 2016-12-18 DIAGNOSIS — Z79899 Other long term (current) drug therapy: Secondary | ICD-10-CM | POA: Insufficient documentation

## 2016-12-18 MED ORDER — OXYCODONE-ACETAMINOPHEN 5-325 MG PO TABS
2.0000 | ORAL_TABLET | Freq: Once | ORAL | Status: AC
  Administered 2016-12-18: 2 via ORAL
  Filled 2016-12-18: qty 2

## 2016-12-18 MED ORDER — PREDNISONE 10 MG (21) PO TBPK: ORAL_TABLET | Freq: Every day | ORAL | 0 refills | Status: DC

## 2016-12-18 MED ORDER — DIAZEPAM 5 MG PO TABS
5.0000 mg | ORAL_TABLET | Freq: Once | ORAL | Status: AC
  Administered 2016-12-18: 5 mg via ORAL
  Filled 2016-12-18: qty 1

## 2016-12-18 MED ORDER — KETOROLAC TROMETHAMINE 60 MG/2ML IM SOLN
30.0000 mg | Freq: Once | INTRAMUSCULAR | Status: AC
  Administered 2016-12-18: 60 mg via INTRAMUSCULAR
  Filled 2016-12-18: qty 2

## 2016-12-18 MED ORDER — METHOCARBAMOL 750 MG PO TABS: 750.0000 mg | ORAL_TABLET | Freq: Four times a day (QID) | ORAL | 0 refills | Status: DC

## 2016-12-18 NOTE — ED Provider Notes (Signed)
[facility]-EMERGENCY DEPT Provider Note   CSN: [id number] Arrival date & time: 12/18/16  0865     History   Chief Complaint Chief Complaint  Patient presents with  . Leg Pain    left leg    HPI Ashley [name] is a 51 y.o. female.  51 year old female presents with one week of left-sided leg pain is localized to the sciatic notch. Has described the pain is sharp and worse with standing or movement. Has used over-the-counter medications without relief. Denies any recent history of trauma. No bowel or bladder dysfunction. States that she gets pins and needles sensation down her leg but denies any foot drop. Called EMS and was transported here.      History reviewed. No pertinent past medical history.  There are no active problems to display for this patient.   Past Surgical History:  Procedure Laterality Date  . TUBAL LIGATION      OB History    No data available       Home Medications    Prior to Admission medications   Medication Sig Start Date End Date Taking? Authorizing Provider  ALPRAZolam Prudy Feeler) 0.5 MG tablet Take 1 tablet (0.5 mg total) by mouth at bedtime as needed for anxiety. 08/18/14   Renne Crigler, PA-C  guaiFENesin (MUCINEX) 600 MG 12 hr tablet Take 600 mg by mouth 2 (two) times daily as needed for cough.    [provider]  HYDROcodone-acetaminophen (NORCO/VICODIN) 5-325 MG per tablet Take 1-2 tablets every 6 hours as needed for severe pain 08/18/14   Renne Crigler, PA-C    Family History No family history on file.  Social History Social History  Substance Use Topics  . Smoking status: Current Every Day Smoker  . Smokeless tobacco: Not on file  . Alcohol use No     Allergies   Patient has no known allergies.   Review of Systems Review of Systems  All other systems reviewed and are negative.    Physical Exam Updated Vital Signs BP (!) 141/91 (BP Location: Right Arm)   Pulse 72   Ht 1.6 m (5\' 3" )   Wt 75.8 kg (167 lb)   SpO2  100%   BMI 29.58 kg/m   Physical Exam  Constitutional: She is oriented to person, place, and time. She appears well-developed and well-nourished.  Non-toxic appearance. No distress.  HENT:  Head: Normocephalic and atraumatic.  Eyes: Conjunctivae, EOM and lids are normal. Pupils are equal, round, and reactive to light.  Neck: Normal range of motion. Neck supple. No tracheal deviation present. No thyroid mass present.  Cardiovascular: Normal rate, regular rhythm and normal heart sounds.  Exam reveals no gallop.   No murmur heard. Pulmonary/Chest: Effort normal and breath sounds normal. No stridor. No respiratory distress. She has no decreased breath sounds. She has no wheezes. She has no rhonchi. She has no rales.  Abdominal: Soft. Normal appearance and bowel sounds are normal. She exhibits no distension. There is no tenderness. There is no rebound and no CVA tenderness.  Musculoskeletal: Normal range of motion. She exhibits no edema or tenderness.       Legs: Neurological: She is alert and oriented to person, place, and time. She has normal strength. No cranial nerve deficit or sensory deficit. GCS eye subscore is 4. GCS verbal subscore is 5. GCS motor subscore is 6.  Skin: Skin is warm and dry. No abrasion and no rash noted.  Psychiatric: She has a normal mood and affect. Her speech  is normal and behavior is normal.  Nursing note and vitals reviewed.    ED Treatments / Results  Labs (all labs ordered are listed, but only abnormal results are displayed) Labs Reviewed - No data to display  EKG  EKG Interpretation None       Radiology No results found.  Procedures Procedures (including critical care time)  Medications Ordered in ED Medications  diazepam (VALIUM) tablet 5 mg (not administered)  ketorolac (TORADOL) injection 30 mg (not administered)  oxyCODONE-acetaminophen (PERCOCET/ROXICET) 5-325 MG per tablet 2 tablet (not administered)     Initial Impression /  Assessment and Plan / ED Course  I have reviewed the triage vital signs and the nursing notes.  Pertinent labs & imaging results that were available during my care of the patient were reviewed by me and considered in my medical decision making (see chart for details).     Pt medicated and feels better Suspect sciatica Stable for d/c  Final Clinical Impressions(s) / ED Diagnoses   Final diagnoses:  None    New Prescriptions New Prescriptions   No medications on file     Lorre NickAllen, Tierra Thoma, MD 12/18/16 1023

## 2016-12-18 NOTE — ED Triage Notes (Signed)
Patient is alert and oriented x4.  She is complaining of left leg pain that started a week ago and progressively has gotten worse.  Currently she rates her pain 10 of 10.

## 2016-12-18 NOTE — ED Notes (Signed)
Patient is alert and oriented x3.  She was given DC instructions and follow up visit instructions.  Patient gave verbal understanding. She was DC ambulatory under her own power to home.  V/S stable.  He was not showing any signs of distress on DC 

## 2016-12-18 NOTE — ED Notes (Signed)
Bed: WA19 Expected date:  Expected time:  Means of arrival:  Comments: EMS leg pain 

## 2016-12-20 ENCOUNTER — Encounter (HOSPITAL_COMMUNITY): Payer: Self-pay | Admitting: Emergency Medicine

## 2016-12-20 ENCOUNTER — Emergency Department (HOSPITAL_COMMUNITY): Payer: Self-pay

## 2016-12-20 ENCOUNTER — Emergency Department (HOSPITAL_COMMUNITY)
Admission: EM | Admit: 2016-12-20 | Discharge: 2016-12-20 | Disposition: A | Discharge status: Home / Self Care | Payer: Self-pay | Attending: Emergency Medicine | Admitting: Emergency Medicine

## 2016-12-20 DIAGNOSIS — M5432 Sciatica, left side: Secondary | ICD-10-CM | POA: Insufficient documentation

## 2016-12-20 DIAGNOSIS — F17210 Nicotine dependence, cigarettes, uncomplicated: Secondary | ICD-10-CM | POA: Insufficient documentation

## 2016-12-20 MED ORDER — DIAZEPAM 2 MG PO TABS
2.0000 mg | ORAL_TABLET | Freq: Once | ORAL | Status: AC
  Administered 2016-12-20: 2 mg via ORAL
  Filled 2016-12-20: qty 1

## 2016-12-20 MED ORDER — OXYCODONE-ACETAMINOPHEN 5-325 MG PO TABS: 1.0000 | ORAL_TABLET | Freq: Four times a day (QID) | ORAL | 0 refills | Status: DC | PRN

## 2016-12-20 MED ORDER — KETOROLAC TROMETHAMINE 30 MG/ML IJ SOLN
30.0000 mg | Freq: Once | INTRAMUSCULAR | Status: AC
  Administered 2016-12-20: 30 mg via INTRAMUSCULAR
  Filled 2016-12-20: qty 1

## 2016-12-20 MED ORDER — OXYCODONE-ACETAMINOPHEN 5-325 MG PO TABS
1.0000 | ORAL_TABLET | Freq: Once | ORAL | Status: AC
  Administered 2016-12-20: 1 via ORAL
  Filled 2016-12-20: qty 1

## 2016-12-20 MED ORDER — DIAZEPAM 2 MG PO TABS: 2.0000 mg | ORAL_TABLET | Freq: Three times a day (TID) | ORAL | 0 refills | Status: AC | PRN

## 2016-12-20 NOTE — ED Provider Notes (Signed)
[facility]-EMERGENCY DEPT Provider Note   CSN: [id number] Arrival date & time: 12/20/16  0802     History   Chief Complaint Chief Complaint  Patient presents with  . Leg Pain    HPI Ashley [name] is a 51 y.o. female.  Patient reports that she has had about 1 month duration of discomfort in her LLE.  She reports that on Saturday it suddenly became worse, noting that she had significant sharp pain in her left hip radiating to her left low back and left thigh.  She took OTC NSAIDs/ Analgesics with no relief.  She went to the [facility] ED for evaluation on 6/19 and was diagnosed with sciatica.  She reports that the medications that were given in the ED helped and she thought that she would be better.  She reports that her pain is unrelieved by Robaxin, Excedrin, Prednisone, Tylenol.  She has been using cold packs as recommended.  She reports that the pain is actually worse now 10/10 all day and worse with standing.  She reports she was not even able to get out of bed from the extreme pain with movement of her LLE.  She reports minimal numbness and tingling in her toes.  No saddle anesthesia, fecal incontinence, urinary retention, preceding injury or overuse.  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  She expresses that she wants to go back to work ASAP but cannot because of her pain and limited mobility.    She takes no medications other than aforementioned.  She has no PMH.      History reviewed. No pertinent past medical history.  There are no active problems to display for this patient.   Past Surgical History:  Procedure Laterality Date  . TUBAL LIGATION      OB History    No data available       Home Medications    Prior to Admission medications   Medication Sig Start Date End Date Taking? Authorizing Provider  acetaminophen (TYLENOL) 500 MG tablet Take 1,000 mg by mouth every 6 (six) hours as needed for mild pain or  moderate pain.   Yes [provider]  aspirin-acetaminophen-caffeine (EXCEDRIN MIGRAINE) [phone] MG tablet Take 2-3 tablets by mouth every 6 (six) hours as needed for headache or migraine.   Yes [provider]  ibuprofen (ADVIL,MOTRIN) 200 MG tablet Take 800 mg by mouth every 6 (six) hours as needed for mild pain or moderate pain.   Yes [provider]  predniSONE (STERAPRED UNI-PAK 21 TAB) 10 MG (21) TBPK tablet Take by mouth daily. Take 6 tabs by mouth daily  for 2 days, then 5 tabs for 2 days, then 4 tabs for 2 days, then 3 tabs for 2 days, 2 tabs for 2 days, then 1 tab by mouth daily for 2 days 12/18/16  Yes Lorre Nick, MD  diazepam (VALIUM) 2 MG tablet Take 1 tablet (2 mg total) by mouth every 8 (eight) hours as needed for muscle spasms. 12/20/16 12/20/17  Raliegh Ip, DO  oxyCODONE-acetaminophen (ROXICET) 5-325 MG tablet Take 1 tablet by mouth every 6 (six) hours as needed. 12/20/16 12/20/17  Raliegh Ip, DO    Family History History reviewed. No pertinent family history.  Social History Social History  Substance Use Topics  . Smoking status: Current Every Day Smoker    Types: Cigarettes  . Smokeless tobacco: Never Used  .  Alcohol use No     Allergies   Patient has no known allergies.   Review of Systems Review of Systems  HENT: Negative.   Eyes: Negative.   Respiratory: Negative.   Cardiovascular: Negative.   Gastrointestinal: Negative.   Endocrine: Negative.   Genitourinary: Negative for difficulty urinating.  Musculoskeletal: Positive for arthralgias and gait problem.  Allergic/Immunologic: Negative.   Neurological: Positive for weakness (2/2 pain in LLE) and numbness (left toes).  Hematological: Negative.   Psychiatric/Behavioral: Negative.      Physical Exam Updated Vital Signs BP (!) 148/82   Pulse 66   Temp 98 F (36.7 C)   Resp 16   Ht 5\' 3"  (1.6 m)   Wt 75.8 kg (167 lb)   SpO2 100%   BMI 29.58 kg/m    Physical Exam  Constitutional: She is oriented to person, place, and time. She appears well-developed and well-nourished. She appears distressed (appears uncomfortable).  HENT:  Head: Normocephalic and atraumatic.  Eyes: Conjunctivae and EOM are normal. Pupils are equal, round, and reactive to light.  Neck: Normal range of motion. Neck supple.  Cardiovascular: Normal rate, regular rhythm, normal heart sounds and intact distal pulses.   Pulmonary/Chest: Effort normal and breath sounds normal.  Abdominal: Soft. She exhibits no distension. There is no tenderness.  Musculoskeletal: She exhibits no deformity.  Patient has Full AROM of RLE LLE with decreased flexion of hip, negative FADIR/FABER, negative straight leg raise.  She has mild tenderness to the greater trochanter, and lumbosacral junction on the left.  She has full AROM of lumbar spine.  No midline TTP to lumbar spine.  Neurological: She is alert and oriented to person, place, and time.  Light touch sensation in tact across all dermatomes of the LLE.  Skin: Skin is warm. Capillary refill takes less than 2 seconds. She is not diaphoretic.  Nursing note and vitals reviewed.  ED Treatments / Results  Labs (all labs ordered are listed, but only abnormal results are displayed) Labs Reviewed - No data to display  EKG  EKG Interpretation None       Radiology Dg Lumbar Spine Complete  Result Date: 12/20/2016 CLINICAL DATA:  Low back pain since a fall 2 days ago. Initial encounter. EXAM: LUMBAR SPINE - COMPLETE 4+ VIEW COMPARISON:  CT abdomen and pelvis 08/30/2010. FINDINGS: There is no evidence of lumbar spine fracture. Alignment is normal. Intervertebral disc spaces are maintained. Aortic atherosclerosis is noted. IMPRESSION: Normal appearing lumbar spine. Atherosclerosis. Electronically Signed   By: Drusilla Kanner M.D.   On: 12/20/2016 12:49   Dg Hip Unilat With Pelvis 2-3 Views Left  Result Date: 12/20/2016 CLINICAL DATA:   Left hip pain due to a fall 2 days ago. Initial encounter. EXAM: DG HIP (WITH OR WITHOUT PELVIS) 2-3V LEFT COMPARISON:  None. FINDINGS: There is no evidence of hip fracture or dislocation. There is no evidence of arthropathy or other focal bone abnormality. IMPRESSION: Negative exam. Electronically Signed   By: Drusilla Kanner M.D.   On: 12/20/2016 12:47    Procedures Procedures (including critical care time)  Medications Ordered in ED Medications  diazepam (VALIUM) tablet 2 mg (2 mg Oral Given 12/20/16 1049)  oxyCODONE-acetaminophen (PERCOCET/ROXICET) 5-325 MG per tablet 1 tablet (1 tablet Oral Given 12/20/16 1049)  ketorolac (TORADOL) 30 MG/ML injection 30 mg (30 mg Intramuscular Given 12/20/16 1050)     Initial Impression / Assessment and Plan / ED Course  I have reviewed the triage vital signs and the nursing  notes.  Pertinent labs & imaging results that were available during my care of the patient were reviewed by me and considered in my medical decision making (see chart for details).    0945: BP 149/87 in room.  VS otherwise stable.  Patient in visible pain with movement of LLE.  Exam consistent with probable sciatica.  1030: Valium 2mg , Toradol 30, Percocet 5 ordered for pain.  1113: s/p above medications.  She reports that pain is tolerable now.    1130: Xrays of hip and lumbar spine ordered.  Patient able to ambulate with assistance.  Final Clinical Impressions(s) / ED Diagnoses   Final diagnoses:  Sciatica of left side   Tally DueLinda K Gonia is a 51 y.o. female that presented to ED with LLE pain and difficulty walking.  Her exam is clinically consistent with sciatica.  DDx considered herniated disc, hip pathology, bony injury, muscle spasm.  She was given Valium, Toradol, Percocet with good relief of symptoms.  She had xrays of her hip and back, which were unrevealing.  She was able to ambulate with assistance after medication.  Home care instructions were reviewed.  [place] narcotic  database reviewed, no medications on file.  She was discharged with Valium, Percocet and instructed to continue Prednisone taper.  Resource guide for PCP, ortho provided.  Encouraged her to establish care for ongoing management of sciatica.  Return precautions reviewed.  She was discharged in stable condition home.  New Prescriptions Discharge Medication List as of 12/20/2016  1:01 PM    START taking these medications   Details  diazepam (VALIUM) 2 MG tablet Take 1 tablet (2 mg total) by mouth every 8 (eight) hours as needed for muscle spasms., Starting Thu 12/20/2016, Until Fri 12/20/2017, Print    oxyCODONE-acetaminophen (ROXICET) 5-325 MG tablet Take 1 tablet by mouth every 6 (six) hours as needed., Starting Thu 12/20/2016, Until Fri 12/20/2017, Print         Nadine CountsGottschalk, [place]una SecaAshly M, DO 12/20/16 1414    Cathren LaineSteinl, Kevin, MD 12/20/16 1430

## 2016-12-20 NOTE — ED Notes (Signed)
Wheeled patient to the bathroom  

## 2016-12-20 NOTE — Discharge Instructions (Addendum)
Your xrays here look good or normal.   I highly encourage you to schedule an appointment with a PCP.  I have enclosed a list of available providers in the area.  They will be able to continue to monitor your progress.  If your pain persists, discussed advanced imaging such as mri with your primary care doctor.   Continue to take the Prednisone.  You have been prescribed Valium and Percocet today.  Please use these ONLY AS DIRECTED AS NEEDED.  Caution, they may cause sedation and in combination can decrease respiratory drive.  Do not operate heavy machinery.

## 2016-12-20 NOTE — ED Notes (Signed)
Patient transported to X-ray 

## 2016-12-20 NOTE — ED Triage Notes (Signed)
Pt states she has been having trouble moving her left leg. Pt feels like it just wants to "give out" when she is walking. Pt states [facility] and diagnosed with sciatica. Pt states it is painful all the time. States no over the counter medication is helping her pain. Pt neuro intact.

## 2016-12-28 ENCOUNTER — Encounter (HOSPITAL_COMMUNITY): Payer: Self-pay | Admitting: Emergency Medicine

## 2016-12-28 ENCOUNTER — Ambulatory Visit (INDEPENDENT_AMBULATORY_CARE_PROVIDER_SITE_OTHER): Payer: Self-pay | Admitting: Orthopaedic Surgery

## 2016-12-28 ENCOUNTER — Emergency Department (HOSPITAL_COMMUNITY): Payer: Self-pay

## 2016-12-28 ENCOUNTER — Emergency Department (HOSPITAL_COMMUNITY)
Admission: EM | Admit: 2016-12-28 | Discharge: 2016-12-28 | Disposition: A | Discharge status: Home / Self Care | Payer: Self-pay | Attending: Emergency Medicine | Admitting: Emergency Medicine

## 2016-12-28 DIAGNOSIS — Z79899 Other long term (current) drug therapy: Secondary | ICD-10-CM | POA: Insufficient documentation

## 2016-12-28 DIAGNOSIS — M545 Low back pain, unspecified: Secondary | ICD-10-CM

## 2016-12-28 DIAGNOSIS — F17210 Nicotine dependence, cigarettes, uncomplicated: Secondary | ICD-10-CM | POA: Insufficient documentation

## 2016-12-28 DIAGNOSIS — Z7982 Long term (current) use of aspirin: Secondary | ICD-10-CM | POA: Insufficient documentation

## 2016-12-28 DIAGNOSIS — M25562 Pain in left knee: Secondary | ICD-10-CM | POA: Insufficient documentation

## 2016-12-28 MED ORDER — MORPHINE SULFATE ER 15 MG PO TBCR
15.0000 mg | EXTENDED_RELEASE_TABLET | Freq: Once | ORAL | Status: AC
  Administered 2016-12-28: 15 mg via ORAL
  Filled 2016-12-28: qty 1

## 2016-12-28 MED ORDER — OXYCODONE-ACETAMINOPHEN 5-325 MG PO TABS: 1.0000 | ORAL_TABLET | Freq: Four times a day (QID) | ORAL | 0 refills | Status: AC | PRN

## 2016-12-28 MED ORDER — IBUPROFEN 400 MG PO TABS: 400.0000 mg | ORAL_TABLET | Freq: Once | ORAL | Status: DC

## 2016-12-28 MED ORDER — LIDOCAINE 5 % EX PTCH
1.0000 | MEDICATED_PATCH | CUTANEOUS | 0 refills | Status: AC
Start: 2016-12-28 — End: 2017-01-02

## 2016-12-28 MED ORDER — ONDANSETRON 4 MG PO TBDP
4.0000 mg | ORAL_TABLET | Freq: Once | ORAL | Status: AC
  Administered 2016-12-28: 4 mg via ORAL
  Filled 2016-12-28: qty 1

## 2016-12-28 MED ORDER — METHOCARBAMOL 1000 MG/10ML IJ SOLN
1000.0000 mg | Freq: Once | INTRAMUSCULAR | Status: DC
  Filled 2016-12-28: qty 10

## 2016-12-28 MED ORDER — LIDOCAINE 5 % EX PTCH
1.0000 | MEDICATED_PATCH | Freq: Once | CUTANEOUS | Status: DC
  Administered 2016-12-28: 1 via TRANSDERMAL
  Filled 2016-12-28: qty 1

## 2016-12-28 MED ORDER — KETOROLAC TROMETHAMINE 30 MG/ML IJ SOLN
30.0000 mg | Freq: Once | INTRAMUSCULAR | Status: AC
  Administered 2016-12-28: 30 mg via INTRAMUSCULAR
  Filled 2016-12-28: qty 1

## 2016-12-28 MED ORDER — METHOCARBAMOL 500 MG PO TABS: 500.0000 mg | ORAL_TABLET | Freq: Three times a day (TID) | ORAL | 0 refills | Status: AC

## 2016-12-28 MED ORDER — METHOCARBAMOL 500 MG PO TABS
1000.0000 mg | ORAL_TABLET | Freq: Once | ORAL | Status: AC
  Administered 2016-12-28: 1000 mg via ORAL
  Filled 2016-12-28: qty 2

## 2016-12-28 MED ORDER — ACETAMINOPHEN 500 MG PO TABS
1000.0000 mg | ORAL_TABLET | Freq: Once | ORAL | Status: AC
  Administered 2016-12-28: 1000 mg via ORAL
  Filled 2016-12-28: qty 2

## 2016-12-28 MED ORDER — NAPROXEN 500 MG PO TABS: 500.0000 mg | ORAL_TABLET | Freq: Two times a day (BID) | ORAL | 0 refills | Status: AC

## 2016-12-28 MED ORDER — HYDROMORPHONE HCL 1 MG/ML IJ SOLN
1.0000 mg | Freq: Once | INTRAMUSCULAR | Status: AC
  Administered 2016-12-28: 1 mg via INTRAVENOUS
  Filled 2016-12-28: qty 1

## 2016-12-28 NOTE — Discharge Instructions (Signed)
Your MRI showed a small right-sided disc bulge at levels L4-5 without significant narrowing or compression. It is still unclear what is causing your left-sided low back pain and leg pain.  You need to follow up with orthopedist as soon as possible, please go to your appointment next week.  To treat your symptoms you have been prescribed a short course of Percocet to last you until your orthopedist appointment. Please be aware that we cannot recurrently presribe narcotic pain medications for chronic pain in the emergency department. Touch base with your primary care provider for long-term management of your back pain.  Additionally take naproxen, Robaxin for muscle spasms and apply lidocaine patch every 24 hours.

## 2016-12-28 NOTE — ED Notes (Signed)
Pt returns from MRI and states her pain is 10. Requesting food.

## 2016-12-28 NOTE — ED Notes (Signed)
Pt asked if she was ready for MRI. Pt states she cannot go to MRI un til her pain is controlled. She further states she is claustraphobic. Will inform provider.

## 2016-12-28 NOTE — ED Notes (Signed)
Pt tearful at times. 

## 2016-12-28 NOTE — ED Provider Notes (Signed)
[facility]-EMERGENCY DEPT Provider Note   CSN: [id number] Arrival date & time: 12/28/16  0530     History   Chief Complaint Chief Complaint  Patient presents with  . Leg Pain    HPI Ashley [name] is a 51 y.o. female with no pmh presents to ED for gradually worsening, sharp constant radiating pain originating in LLB that radiates to entire left leg including foot x 1.5 months. Pain originally started in left inguinal region but now seems to originate in L lower back and left buttock. Associated symptoms include minimal numbness to left foot ("but not really that much"). Difficulty getting out of bed and ambulating to bathroom secondary to pain and has to urinate on herself because pain prevents her from getting out of bed. No associated fevers, CP, SOB, abdominal pain, hematuria, dysuria, frequency, b/b incontinence or retention, saddle anesthesia. No recent falls. 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 No preceding falls, MVC or previous back injuries before symptom onset. This is her third time coming to ED for this pain (6/19, 6/21 and today). She reports improvement with ED medications and "shots". She was last discharged with diazepam and percocet on 6/21 and ran out of these medications 5 days ago. She reports 5 days ago she was getting into the bathtub, tried lifting her left leg and heard her left knee crack, now having left knee pain as well and worsening LBP.  She has upcoming ortho appointment next week. No PCP.     HPI  History reviewed. No pertinent past medical history.  There are no active problems to display for this patient.   Past Surgical History:  Procedure Laterality Date  . TUBAL LIGATION      OB History    No data available       Home Medications    Prior to Admission medications   Medication Sig Start Date End Date Taking? Authorizing Provider  acetaminophen (TYLENOL) 500 MG tablet Take 1,000 mg by  mouth every 6 (six) hours as needed for mild pain or moderate pain.   Yes [provider]  aspirin-acetaminophen-caffeine (EXCEDRIN MIGRAINE) [phone] MG tablet Take 2-3 tablets by mouth every 6 (six) hours as needed for headache or migraine.   Yes [provider]  diazepam (VALIUM) 2 MG tablet Take 1 tablet (2 mg total) by mouth every 8 (eight) hours as needed for muscle spasms. 12/20/16 12/20/17 Yes Gottschalk, Ashly M, DO  ibuprofen (ADVIL,MOTRIN) 200 MG tablet Take 800 mg by mouth every 6 (six) hours as needed for mild pain or moderate pain.   Yes [provider]  Menthol, Topical Analgesic, (PAIN RELIEVING PATCH ULTRA ST EX) Apply topically as needed (for pain).   Yes [provider]  lidocaine (LIDODERM) 5 % Place 1 patch onto the skin daily. Apply to area of pain. Change every 24 hours. 12/28/16 01/02/17  Liberty Handy, PA-C  methocarbamol (ROBAXIN) 500 MG tablet Take 1 tablet (500 mg total) by mouth 3 (three) times daily. 12/28/16 01/04/17  Liberty Handy, PA-C  naproxen (NAPROSYN) 500 MG tablet Take 1 tablet (500 mg total) by mouth 2 (two) times daily. 12/28/16 01/04/17  Liberty Handy, PA-C  oxyCODONE-acetaminophen (ROXICET) 5-325 MG tablet Take 1 tablet by mouth every 6 (six) hours as needed. 12/28/16 01/04/17  Liberty Handy, PA-C  predniSONE (STERAPRED UNI-PAK 21 TAB) 10 MG (21) TBPK tablet Take by mouth daily. Take 6 tabs by mouth daily  for 2 days, then 5 tabs for 2 days, then 4 tabs for 2 days, then 3 tabs for 2 days, 2 tabs for 2 days, then 1 tab by mouth daily for 2 days Patient not taking: Reported on 12/28/2016 12/18/16   Lorre Nick, MD    Family History History reviewed. No pertinent family history.  Social History Social History  Substance Use Topics  . Smoking status: Current Every Day Smoker    Types: Cigarettes  . Smokeless tobacco: Never Used  . Alcohol use No     Allergies   Patient has no known allergies.   Review of  Systems Review of Systems  Constitutional: Negative for fever.  Respiratory: Negative for cough and shortness of breath.   Cardiovascular: Negative for chest pain and palpitations.  Gastrointestinal: Negative for abdominal pain, constipation, diarrhea, nausea and vomiting.  Genitourinary: Negative for difficulty urinating, dysuria, frequency, hematuria and urgency.  Musculoskeletal: Positive for arthralgias, back pain and myalgias.  Skin: Negative for rash.  Allergic/Immunologic: Negative for immunocompromised state.  Neurological: Positive for numbness. Negative for weakness and headaches.     Physical Exam Updated Vital Signs BP 123/69 (BP Location: Right Arm)   Pulse 62   Temp 98.7 F (37.1 C) (Oral)   Resp 18   Ht 5\' 3"  (1.6 m)   Wt 75.8 kg (167 lb)   SpO2 99%   BMI 29.58 kg/m   Physical Exam  Constitutional: She is oriented to person, place, and time. She appears well-developed and well-nourished. No distress.  Appears uncomfortable. Teary eyed.   HENT:  Head: Normocephalic and atraumatic.  Nose: Nose normal.  Mouth/Throat: Oropharynx is clear and moist. No oropharyngeal exudate.  Eyes: Conjunctivae and EOM are normal. Pupils are equal, round, and reactive to light.  Neck: Normal range of motion. Neck supple.  No midline cervical spine tenderness. Full AROM of neck spine.   Cardiovascular: Normal rate, regular rhythm, normal heart sounds and intact distal pulses.   No murmur heard. Calves supple and non tender No LE edema Femora, popliteal and DP pulses 2+ bilaterally   Pulmonary/Chest: Effort normal and breath sounds normal. No respiratory distress.  Abdominal: Soft. Bowel sounds are normal. There is no tenderness.  Musculoskeletal: Normal range of motion. She exhibits tenderness. She exhibits no deformity.  Patient did not tolerate getting out of bed, despite my assistance. She screamed in pain.   Unable to inspect or palpate lumbar spine spinous processes   TTP  to left trochanter, sciatic notch, SI joint  +L SLR and Faber  Left knee diffusely tender without edema, erythema, warmth, did not tolerate PROM  Lymphadenopathy:    She has no cervical adenopathy.  Neurological: She is alert and oriented to person, place, and time. No sensory deficit.  4/5 strength with LEFT hip flexion and extension  4/5 strength with LEFT knee flexion and extension,  5/5 strength with LEFT ankle dorsiflexion/plantar flexion Sensation to light touch intact in the distribution of the obturator nerve, lateral cutaneous nerve, saphenous nerve and sural nerve  5/5 strength and sensation to light touch intact in RIGHT lower extremity  Skin: Skin is warm and dry. Capillary refill takes less than 2 seconds.  Psychiatric: She has a normal mood and affect. Her behavior is normal. Judgment and thought content normal.  Nursing note and vitals reviewed.    ED Treatments / Results  Labs (all labs ordered are listed, but only abnormal results are displayed) Labs Reviewed  URINALYSIS, ROUTINE W REFLEX MICROSCOPIC  EKG  EKG Interpretation None       Radiology Mr Lumbar Spine Wo Contrast  Result Date: 12/28/2016 CLINICAL DATA:  Low back pain left leg pain EXAM: MRI LUMBAR SPINE WITHOUT CONTRAST TECHNIQUE: Multiplanar, multisequence MR imaging of the lumbar spine was performed. No intravenous contrast was administered. COMPARISON:  Lumbar radiographs 12/20/2016 FINDINGS: Segmentation:  Normal Alignment:  Normal Vertebrae:  Negative for fracture or mass. Conus medullaris: Extends to the L1-2 level and appears normal. Paraspinal and other soft tissues: Negative Disc levels: L1-2: Negative L2-3:  Negative L3-4:  Negative L4-5: Small right-sided disc protrusion without significant neural impingement or stenosis. L5-S1:  Negative IMPRESSION: Small right-sided disc protrusion L4-5 without stenosis or neural compression. Electronically Signed   By: Marlan Palau M.D.   On: 12/28/2016  11:07   Dg Knee Complete 4 Views Left  Result Date: 12/28/2016 CLINICAL DATA:  Severe pain.  No injury . EXAM: LEFT KNEE - COMPLETE 4+ VIEW COMPARISON:  12/20/2016. FINDINGS: No acute bony or joint abnormality . IMPRESSION: No acute abnormality. Electronically Signed   By: Maisie Fus  Register   On: 12/28/2016 07:32    Procedures Procedures (including critical care time)  Medications Ordered in ED Medications  lidocaine (LIDODERM) 5 % 1 patch (1 patch Transdermal Patch Applied 12/28/16 0705)  morphine (MS CONTIN) 12 hr tablet 15 mg (15 mg Oral Given 12/28/16 0705)  acetaminophen (TYLENOL) tablet 1,000 mg (1,000 mg Oral Given 12/28/16 0647)  ketorolac (TORADOL) 30 MG/ML injection 30 mg (30 mg Intramuscular Given 12/28/16 0648)  methocarbamol (ROBAXIN) tablet 1,000 mg (1,000 mg Oral Given 12/28/16 0755)  HYDROmorphone (DILAUDID) injection 1 mg (1 mg Intravenous Given 12/28/16 0854)  ondansetron (ZOFRAN-ODT) disintegrating tablet 4 mg (4 mg Oral Given 12/28/16 1212)     Initial Impression / Assessment and Plan / ED Course  I have reviewed the triage vital signs and the nursing notes.  Pertinent labs & imaging results that were available during my care of the patient were reviewed by me and considered in my medical decision making (see chart for details).  Clinical Course as of Dec 29 1215  Fri Dec 28, 2016  0752 Pt called out for more pain medication; oral APAP, robaxin, MS contin, lidocaine patch, toradol provided no relief. She refuses to go to MRI before pain is well controlled.   [CG]  1141 IMPRESSION: Small right-sided disc protrusion L4-5 without stenosis or neural compression. MR LUMBAR SPINE WO CONTRAST [CG]    Clinical Course User Index [CG] Liberty Handy, PA-C   51 year old female presents to the ED with progressively worsening left lower back pain that radiates to the entire left lower extremity. This is her third visit to the ED in the last week and a half for same symptoms. On  my exam she appears uncomfortable, is teary eyed. Does not tolerate inspection or palpation of lumbar spine, lifting her left lower extremity causes her severe pain. I attempted to ambulate patient but she couldn't tolerate it. She has been using bed pan to urinate 2/2 severe pain with ambulation. Patient has not had any recent falls or trauma, have low suspicion for lumbar spine fracture. No urinary symptoms, doubt UTI/Pyelo. No numbness, weakness, saddle anesthesia, bladder/bowel incontinence or retention to recent suspicion for cauda equina however given inability to assess full lumbar spine, poor tolerance of exam, and repeated ED visits will obtain MRI. She had normal lumbar spine x-ray last week. Discussed with Dr. Preston Fleeting, who agreed with MRI.   MRI is  reassuring, showing small RIGHT-sided disc protrusion at L4-5 without stenosis or compression. Discussed MRI findings with patient. Patient has scheduled appointment with orthopedist in 7 days. She has required morphine, Dilaudid, Toradol, lidocaine patch, robaxin in the ED to decrease her pain to tolerable level. [place] narcotic database was reviewed, she only has one prescription for Percocet which was given to her at last ED visit. Given severe pain, inability to ambulate I think it prudent to discharge patient with Percocet until her next appointment with orthopedist. Will also advise high-dose NSAIDs, muscle relaxer, lidocaine patch for comprehensive control of her pain. Had long discussion with patient regarding narcotic prescriptions from the ED, she is aware that we are not able to continue prescribing narcotics for chronic pain. She reasonable. She was discharged in good condition.  Final Clinical Impressions(s) / ED Diagnoses   Final diagnoses:  Low back pain    New Prescriptions New Prescriptions   LIDOCAINE (LIDODERM) 5 %    Place 1 patch onto the skin daily. Apply to area of pain. Change every 24 hours.   METHOCARBAMOL (ROBAXIN) 500 MG  TABLET    Take 1 tablet (500 mg total) by mouth 3 (three) times daily.   NAPROXEN (NAPROSYN) 500 MG TABLET    Take 1 tablet (500 mg total) by mouth 2 (two) times daily.     Liberty HandyGibbons, Bolton Canupp J, PA-C 12/28/16 1217    Dione BoozeGlick, David, MD 12/29/16 [phone]

## 2016-12-28 NOTE — ED Triage Notes (Signed)
Ems pt from home has had left leg pain for the past 1.5 months was seen here previously for the same with no improvement. Pt rates pain 10/10

## 2016-12-28 NOTE — ED Notes (Signed)
Pt appears tearful in room reports pain

## 2016-12-28 NOTE — ED Notes (Signed)
Pt states she understands instructions. Pt home with family.StaBLE WITH STEADY GAIT

## 2016-12-28 NOTE — ED Notes (Signed)
Pt requesting PO fluids and same given after discussed with PA.

## 2018-07-13 ENCOUNTER — Emergency Department (HOSPITAL_COMMUNITY)
Admission: EM | Admit: 2018-07-13 | Discharge: 2018-07-13 | Disposition: A | Discharge status: Home / Self Care | Payer: Self-pay | Attending: Emergency Medicine | Admitting: Emergency Medicine

## 2018-07-13 ENCOUNTER — Other Ambulatory Visit: Payer: Self-pay

## 2018-07-13 ENCOUNTER — Emergency Department (HOSPITAL_COMMUNITY): Payer: Self-pay

## 2018-07-13 ENCOUNTER — Encounter (HOSPITAL_COMMUNITY): Payer: Self-pay

## 2018-07-13 DIAGNOSIS — R109 Unspecified abdominal pain: Secondary | ICD-10-CM | POA: Insufficient documentation

## 2018-07-13 DIAGNOSIS — E876 Hypokalemia: Secondary | ICD-10-CM | POA: Insufficient documentation

## 2018-07-13 DIAGNOSIS — F17210 Nicotine dependence, cigarettes, uncomplicated: Secondary | ICD-10-CM | POA: Insufficient documentation

## 2018-07-13 DIAGNOSIS — Z79899 Other long term (current) drug therapy: Secondary | ICD-10-CM | POA: Insufficient documentation

## 2018-07-13 DIAGNOSIS — R1031 Right lower quadrant pain: Secondary | ICD-10-CM | POA: Insufficient documentation

## 2018-07-13 LAB — LIPASE, BLOOD: Lipase: 40 U/L (ref 11–51)

## 2018-07-13 LAB — CBC
HCT: 40.3 % (ref 36.0–46.0)
Hemoglobin: 12.8 g/dL (ref 12.0–15.0)
MCH: 29.2 pg (ref 26.0–34.0)
MCHC: 31.8 g/dL (ref 30.0–36.0)
MCV: 91.8 fL (ref 80.0–100.0)
PLATELETS: 317 10*3/uL (ref 150–400)
RBC: 4.39 MIL/uL (ref 3.87–5.11)
RDW: 12.7 % (ref 11.5–15.5)
WBC: 9.2 10*3/uL (ref 4.0–10.5)
nRBC: 0 % (ref 0.0–0.2)

## 2018-07-13 LAB — RAPID URINE DRUG SCREEN, HOSP PERFORMED
Amphetamines: NOT DETECTED
BENZODIAZEPINES: NOT DETECTED
Barbiturates: NOT DETECTED
COCAINE: POSITIVE — AB
OPIATES: NOT DETECTED
Tetrahydrocannabinol: NOT DETECTED

## 2018-07-13 LAB — URINALYSIS, ROUTINE W REFLEX MICROSCOPIC
Bilirubin Urine: NEGATIVE
Glucose, UA: NEGATIVE mg/dL
Hgb urine dipstick: NEGATIVE
Ketones, ur: 5 mg/dL — AB
Leukocytes, UA: NEGATIVE
Nitrite: NEGATIVE
Protein, ur: NEGATIVE mg/dL
Specific Gravity, Urine: 1.016 (ref 1.005–1.030)
pH: 5 (ref 5.0–8.0)

## 2018-07-13 LAB — COMPREHENSIVE METABOLIC PANEL
ALT: 17 U/L (ref 0–44)
AST: 16 U/L (ref 15–41)
Albumin: 4.2 g/dL (ref 3.5–5.0)
Alkaline Phosphatase: 109 U/L (ref 38–126)
Anion gap: 10 (ref 5–15)
BUN: 11 mg/dL (ref 6–20)
CO2: 24 mmol/L (ref 22–32)
Calcium: 9 mg/dL (ref 8.9–10.3)
Chloride: 102 mmol/L (ref 98–111)
Creatinine, Ser: 0.61 mg/dL (ref 0.44–1.00)
GFR calc Af Amer: >60 mL/min (ref >60)
GFR calc non Af Amer: >60 mL/min (ref >60)
Glucose, Bld: 102 mg/dL — ABNORMAL HIGH (ref 70–99)
Potassium: 2.8 mmol/L — ABNORMAL LOW (ref 3.5–5.1)
SODIUM: 136 mmol/L (ref 135–145)
Total Bilirubin: 0.7 mg/dL (ref 0.3–1.2)
Total Protein: 6.8 g/dL (ref 6.5–8.1)

## 2018-07-13 LAB — PREGNANCY, URINE: Preg Test, Ur: NEGATIVE

## 2018-07-13 MED ORDER — POTASSIUM CHLORIDE CRYS ER 20 MEQ PO TBCR: 20.0000 meq | EXTENDED_RELEASE_TABLET | Freq: Every day | ORAL | 0 refills | Status: DC

## 2018-07-13 MED ORDER — PANTOPRAZOLE SODIUM 40 MG PO TBEC: 40.0000 mg | DELAYED_RELEASE_TABLET | Freq: Every day | ORAL | 0 refills | Status: DC

## 2018-07-13 MED ORDER — ONDANSETRON 8 MG PO TBDP: 8.0000 mg | ORAL_TABLET | Freq: Three times a day (TID) | ORAL | 0 refills | Status: DC | PRN

## 2018-07-13 MED ORDER — POTASSIUM CHLORIDE CRYS ER 20 MEQ PO TBCR
40.0000 meq | EXTENDED_RELEASE_TABLET | Freq: Once | ORAL | Status: AC
  Administered 2018-07-13: 40 meq via ORAL
  Filled 2018-07-13: qty 2

## 2018-07-13 MED ORDER — IOPAMIDOL (ISOVUE-300) INJECTION 61%
INTRAVENOUS | Status: AC
  Filled 2018-07-13: qty 100

## 2018-07-13 MED ORDER — FAMOTIDINE IN NACL 20-0.9 MG/50ML-% IV SOLN
20.0000 mg | Freq: Once | INTRAVENOUS | Status: AC
  Administered 2018-07-13: 20 mg via INTRAVENOUS
  Filled 2018-07-13: qty 50

## 2018-07-13 MED ORDER — SODIUM CHLORIDE 0.9 % IV BOLUS
1000.0000 mL | Freq: Once | INTRAVENOUS | Status: AC
  Administered 2018-07-13: 1000 mL via INTRAVENOUS

## 2018-07-13 MED ORDER — ONDANSETRON HCL 4 MG/2ML IJ SOLN
4.0000 mg | Freq: Once | INTRAMUSCULAR | Status: AC
  Administered 2018-07-13: 4 mg via INTRAVENOUS
  Filled 2018-07-13: qty 2

## 2018-07-13 MED ORDER — HYDROMORPHONE HCL 1 MG/ML IJ SOLN
0.5000 mg | Freq: Once | INTRAMUSCULAR | Status: AC
Start: 2018-07-13 — End: 2018-07-13
  Administered 2018-07-13: 0.5 mg via INTRAVENOUS
  Filled 2018-07-13: qty 1

## 2018-07-13 MED ORDER — SODIUM CHLORIDE 0.9 % IV BOLUS: 1000.0000 mL | Freq: Once | INTRAVENOUS | Status: DC

## 2018-07-13 MED ORDER — IOHEXOL 300 MG/ML  SOLN
100.0000 mL | Freq: Once | INTRAMUSCULAR | Status: AC | PRN
  Administered 2018-07-13: 100 mL via INTRAVENOUS

## 2018-07-13 MED ORDER — SODIUM CHLORIDE (PF) 0.9 % IJ SOLN
INTRAMUSCULAR | Status: AC
  Filled 2018-07-13: qty 50

## 2018-07-13 NOTE — ED Triage Notes (Signed)
EMS reports from home, c/o increasing right upper quadrant abdominal pain and vomiting x 4 months. Pt states she takes a lot of Ibuprofen for pain, pain increasing two weeks ago after drinking alcohol heavily.  BP 164/120 HR 86 RR 22 Sp02 95 RA CBG 115  20ga RAC

## 2018-07-13 NOTE — ED Provider Notes (Signed)
[facility]-EMERGENCY DEPT Provider Note   CSN: [id number] Arrival date & time: 07/13/18  4403     History   Chief Complaint Chief Complaint  Patient presents with  . Abdominal Pain  . Emesis    HPI Ashley [name] is a 53 y.o. female.  Patient c/o recurrent right upper abd pain for the past 4 months. Pain intermittent, dull, moderate, non radiating, acute onset. Currently symptoms present since last night. Nausea and vomiting - emesis not bloody or bilious. No abd distension. Denies prior abd surgery. No lower abd or pelvic pain. No vaginal bleeding or discharge. No dysuria or hematuria. No chest pain or sob. No cough or uri symptoms. Denies fever/chills. No hx gallstones, pancreatitis or pud.   The history is provided by the patient and the EMS personnel.  Abdominal Pain  Associated symptoms: vomiting   Associated symptoms: no chest pain, no chills, no constipation, no diarrhea, no dysuria, no fever, no shortness of breath and no sore throat   Emesis  Associated symptoms: abdominal pain   Associated symptoms: no chills, no diarrhea, no fever, no headaches and no sore throat     History reviewed. No pertinent past medical history.  There are no active problems to display for this patient.   Past Surgical History:  Procedure Laterality Date  . TUBAL LIGATION       OB History   No obstetric history on file.      Home Medications    Prior to Admission medications   Medication Sig Start Date End Date Taking? Authorizing Provider  acetaminophen (TYLENOL) 500 MG tablet Take 1,000 mg by mouth every 6 (six) hours as needed for mild pain or moderate pain.    [provider]  aspirin-acetaminophen-caffeine (EXCEDRIN MIGRAINE) [phone] MG tablet Take 2-3 tablets by mouth every 6 (six) hours as needed for headache or migraine.    [provider]  ibuprofen (ADVIL,MOTRIN) 200 MG tablet Take 800 mg by mouth every 6 (six) hours as needed  for mild pain or moderate pain.    [provider]  Menthol, Topical Analgesic, (PAIN RELIEVING PATCH ULTRA ST EX) Apply topically as needed (for pain).    [provider]  predniSONE (STERAPRED UNI-PAK 21 TAB) 10 MG (21) TBPK tablet Take by mouth daily. Take 6 tabs by mouth daily  for 2 days, then 5 tabs for 2 days, then 4 tabs for 2 days, then 3 tabs for 2 days, 2 tabs for 2 days, then 1 tab by mouth daily for 2 days Patient not taking: Reported on 12/28/2016 12/18/16   Lorre Nick, MD    Family History History reviewed. No pertinent family history.  Social History Social History   Tobacco Use  . Smoking status: Current Every Day Smoker    Types: Cigarettes  . Smokeless tobacco: Never Used  Substance Use Topics  . Alcohol use: No  . Drug use: No     Allergies   Patient has no known allergies.   Review of Systems Review of Systems  Constitutional: Negative for chills and fever.  HENT: Negative for sore throat.   Eyes: Negative for redness.  Respiratory: Negative for shortness of breath.   Cardiovascular: Negative for chest pain.  Gastrointestinal: Positive for abdominal pain and vomiting. Negative for constipation and diarrhea.  Genitourinary: Negative for dysuria and flank pain.  Musculoskeletal: Negative for neck pain.  Skin: Negative for rash.  Neurological: Negative for headaches.  Hematological: Does not bruise/bleed easily.  Psychiatric/Behavioral: Negative for confusion.     Physical Exam Updated Vital Signs BP (!) 165/95 (BP Location: Left Arm)   Pulse 74   Temp 98.5 F (36.9 C) (Oral)   Resp 19   Ht 1.6 m (5\' 3" )   Wt 70.3 kg   SpO2 99%   BMI 27.46 kg/m   Physical Exam Vitals signs and nursing note reviewed.  Constitutional:      Appearance: Normal appearance. She is well-developed.  HENT:     Head: Atraumatic.     Nose: Nose normal.     Mouth/Throat:     Mouth: Mucous membranes are moist.  Eyes:     General: No scleral  icterus.    Conjunctiva/sclera: Conjunctivae normal.     Pupils: Pupils are equal, round, and reactive to light.  Neck:     Musculoskeletal: Normal range of motion and neck supple. No neck rigidity or muscular tenderness.     Trachea: No tracheal deviation.  Cardiovascular:     Rate and Rhythm: Normal rate and regular rhythm.     Pulses: Normal pulses.     Heart sounds: Normal heart sounds. No murmur. No friction rub. No gallop.   Pulmonary:     Effort: Pulmonary effort is normal. No respiratory distress.     Breath sounds: Normal breath sounds.  Abdominal:     General: Bowel sounds are normal. There is no distension.     Palpations: Abdomen is soft. There is no mass.     Tenderness: There is abdominal tenderness. There is no guarding or rebound.     Hernia: No hernia is present.     Comments: Upper abd tenderness.   Genitourinary:    Comments: No cva tenderness.  Musculoskeletal:        General: No swelling.  Skin:    General: Skin is warm and dry.     Findings: No rash.  Neurological:     Mental Status: She is alert.     Comments: Alert, speech normal.   Psychiatric:        Mood and Affect: Mood normal.      ED Treatments / Results  Labs (all labs ordered are listed, but only abnormal results are displayed) Results for orders placed or performed during the hospital encounter of 07/13/18  Rapid urine drug screen (hospital performed)  Result Value Ref Range   Opiates NONE DETECTED NONE DETECTED   Cocaine POSITIVE (A) NONE DETECTED   Benzodiazepines NONE DETECTED NONE DETECTED   Amphetamines NONE DETECTED NONE DETECTED   Tetrahydrocannabinol NONE DETECTED NONE DETECTED   Barbiturates NONE DETECTED NONE DETECTED  CBC  Result Value Ref Range   WBC 9.2 4.0 - 10.5 K/uL   RBC 4.39 3.87 - 5.11 MIL/uL   Hemoglobin 12.8 12.0 - 15.0 g/dL   HCT 40.940.3 81.136.0 - 91.446.0 %   MCV 91.8 80.0 - 100.0 fL   MCH 29.2 26.0 - 34.0 pg   MCHC 31.8 30.0 - 36.0 g/dL   RDW 78.212.7 95.611.5 - 21.315.5 %    Platelets 317 150 - 400 K/uL   nRBC 0.0 0.0 - 0.2 %  Comprehensive metabolic panel  Result Value Ref Range   Sodium 136 135 - 145 mmol/L   Potassium 2.8 (L) 3.5 - 5.1 mmol/L   Chloride 102 98 - 111 mmol/L   CO2 24 22 - 32 mmol/L   Glucose, Bld 102 (H) 70 - 99 mg/dL   BUN 11 6 - 20 mg/dL   Creatinine,  Ser 0.61 0.44 - 1.00 mg/dL   Calcium 9.0 8.9 - 25.4 mg/dL   Total Protein 6.8 6.5 - 8.1 g/dL   Albumin 4.2 3.5 - 5.0 g/dL   AST 16 15 - 41 U/L   ALT 17 0 - 44 U/L   Alkaline Phosphatase 109 38 - 126 U/L   Total Bilirubin 0.7 0.3 - 1.2 mg/dL   GFR calc non Af Amer >60 >60 mL/min   GFR calc Af Amer >60 >60 mL/min   Anion gap 10 5 - 15  Lipase, blood  Result Value Ref Range   Lipase 40 11 - 51 U/L  Urinalysis, Routine w reflex microscopic  Result Value Ref Range   Color, Urine YELLOW YELLOW   APPearance CLEAR CLEAR   Specific Gravity, Urine 1.016 1.005 - 1.030   pH 5.0 5.0 - 8.0   Glucose, UA NEGATIVE NEGATIVE mg/dL   Hgb urine dipstick NEGATIVE NEGATIVE   Bilirubin Urine NEGATIVE NEGATIVE   Ketones, ur 5 (A) NEGATIVE mg/dL   Protein, ur NEGATIVE NEGATIVE mg/dL   Nitrite NEGATIVE NEGATIVE   Leukocytes, UA NEGATIVE NEGATIVE  Pregnancy, urine  Result Value Ref Range   Preg Test, Ur NEGATIVE NEGATIVE   Ct Abdomen Pelvis W Contrast  Result Date: 07/13/2018 CLINICAL DATA:  Worsening right-sided abdominal pain and vomiting for 4 months. Suspected appendicitis. EXAM: CT ABDOMEN AND PELVIS WITH CONTRAST TECHNIQUE: Multidetector CT imaging of the abdomen and pelvis was performed using the standard protocol following bolus administration of intravenous contrast. CONTRAST:  OMNIPAQUE IOHEXOL 300 MG/ML  SOLN COMPARISON:  08/30/2010 FINDINGS: Lower Chest: No acute findings. Hepatobiliary: No hepatic masses identified. 1 cm cyst in posterior right hepatic lobe. Focal fatty infiltration adjacent to falciform ligament. Gallbladder is unremarkable. No evidence of biliary ductal dilatation.  Pancreas:  No mass or inflammatory changes. Spleen: Within normal limits in size and appearance. Adrenals/Urinary Tract: Stable tiny benign right adrenal adenoma. No renal masses identified. No evidence of ureteral calculi or hydronephrosis. Unremarkable unopacified urinary bladder. Stomach/Bowel: No evidence of obstruction, inflammatory process or abnormal fluid collections. Normal appendix visualized. Vascular/Lymphatic: No pathologically enlarged lymph nodes. No abdominal aortic aneurysm. Aortic atherosclerosis. Reproductive:  No mass or other significant abnormality. Other:  None. Musculoskeletal:  No suspicious bone lesions identified. IMPRESSION: No evidence of appendicitis or other significant abnormality. Electronically Signed   By: Myles Rosenthal M.D.   On: 07/13/2018 13:29   US Abdomen Limited  Result Date: 07/13/2018 CLINICAL DATA:  Patient with right upper quadrant abdominal pain for 2 weeks. EXAM: ULTRASOUND ABDOMEN LIMITED RIGHT UPPER QUADRANT COMPARISON:  None. FINDINGS: Gallbladder: No gallstones or wall thickening visualized. No sonographic Murphy sign noted by sonographer. Common bile duct: Diameter: 3 mm Liver: No focal lesion identified. Within normal limits in parenchymal echogenicity. Portal vein is patent on color Doppler imaging with normal direction of blood flow towards the liver. IMPRESSION: No cholelithiasis or sonographic evidence for acute cholecystitis. Electronically Signed   By: Annia Belt M.D.   On: 07/13/2018 10:42    EKG None  Radiology Ct Abdomen Pelvis W Contrast  Result Date: 07/13/2018 CLINICAL DATA:  Worsening right-sided abdominal pain and vomiting for 4 months. Suspected appendicitis. EXAM: CT ABDOMEN AND PELVIS WITH CONTRAST TECHNIQUE: Multidetector CT imaging of the abdomen and pelvis was performed using the standard protocol following bolus administration of intravenous contrast. CONTRAST:  OMNIPAQUE IOHEXOL 300 MG/ML  SOLN COMPARISON:  08/30/2010  FINDINGS: Lower Chest: No acute findings. Hepatobiliary: No hepatic masses identified. 1  cm cyst in posterior right hepatic lobe. Focal fatty infiltration adjacent to falciform ligament. Gallbladder is unremarkable. No evidence of biliary ductal dilatation. Pancreas:  No mass or inflammatory changes. Spleen: Within normal limits in size and appearance. Adrenals/Urinary Tract: Stable tiny benign right adrenal adenoma. No renal masses identified. No evidence of ureteral calculi or hydronephrosis. Unremarkable unopacified urinary bladder. Stomach/Bowel: No evidence of obstruction, inflammatory process or abnormal fluid collections. Normal appendix visualized. Vascular/Lymphatic: No pathologically enlarged lymph nodes. No abdominal aortic aneurysm. Aortic atherosclerosis. Reproductive:  No mass or other significant abnormality. Other:  None. Musculoskeletal:  No suspicious bone lesions identified. IMPRESSION: No evidence of appendicitis or other significant abnormality. Electronically Signed   By: Myles RosenthalJohn  Stahl M.D.   On: 07/13/2018 13:29   [place] Abdomen Limited  Result Date: 07/13/2018 CLINICAL DATA:  Patient with right upper quadrant abdominal pain for 2 weeks. EXAM: ULTRASOUND ABDOMEN LIMITED RIGHT UPPER QUADRANT COMPARISON:  None. FINDINGS: Gallbladder: No gallstones or wall thickening visualized. No sonographic Murphy sign noted by sonographer. Common bile duct: Diameter: 3 mm Liver: No focal lesion identified. Within normal limits in parenchymal echogenicity. Portal vein is patent on color Doppler imaging with normal direction of blood flow towards the liver. IMPRESSION: No cholelithiasis or sonographic evidence for acute cholecystitis. Electronically Signed   By: Annia Beltrew  Davis M.D.   On: 07/13/2018 10:42    Procedures Procedures (including critical care time)  Medications Ordered in ED Medications  sodium chloride 0.9 % bolus 1,000 mL (has no administration in time range)  HYDROmorphone (DILAUDID) injection  0.5 mg (has no administration in time range)  ondansetron (ZOFRAN) injection 4 mg (has no administration in time range)  famotidine (PEPCID) IVPB 20 mg premix (has no administration in time range)     Initial Impression / Assessment and Plan / ED Course  I have reviewed the triage vital signs and the nursing notes.  Pertinent labs & imaging results that were available during my care of the patient were reviewed by me and considered in my medical decision making (see chart for details).  Iv ns bolus. Labs sent.  Dilaudid iv. zofran iv. pepcid iv.   Reviewed nursing notes and prior charts for additional history.   Labs reviewed - k low, kcl po.   U/s reviewed - neg acute.  Discussed above results w pt and plan for d/c w outpt pcp/gi f/u.  Pt became upset, requested additional imaging to eval her abd pain, states does not want to leave without additional testing/ct.   Ct reviewed - neg for acute process.  Pt afebrile. No emesis during ED stay. Pt appears stable for d/c.    Final Clinical Impressions(s) / ED Diagnoses   Final diagnoses:  None    ED Discharge Orders    None       Cathren LaineSteinl, Dalayla Aldredge, MD 07/13/18 1340

## 2018-07-13 NOTE — Discharge Instructions (Addendum)
It was our pleasure to provide your ER care today - we hope that you feel better.  Your ultrasound and ct scan were both read by our radiologist as showing no acute process, no acute abnormality seen.   Take protonix (acid blocker medication). Take acetaminophen as need for pain. You may also try pepcid and maalox for symptom relief. Take zofran as need for nausea.   For recurrent abdominal pain, follow up with gi doctor in the next couple of weeks - call office Monday to arrange appointment.  From today's labs, your potassium level is low (2.8) - eat plenty of fruits and vegetables, take potassium supplement as prescribed, and follow up with primary care doctor in 1 week.  Return to ER if worse, new symptoms, fevers, persistent vomiting, other concern.   You were given pain medication in the ER - no driving for the next 4 hours.

## 2018-07-13 NOTE — ED Notes (Signed)
Bed: CN47 Expected date:  Expected time:  Means of arrival:  Comments: RUQ pain x 2 weeks

## 2018-12-30 ENCOUNTER — Encounter: Payer: Self-pay | Admitting: Emergency Medicine

## 2018-12-30 ENCOUNTER — Other Ambulatory Visit: Payer: Self-pay

## 2018-12-30 ENCOUNTER — Inpatient Hospital Stay
Admission: EM | Admit: 2018-12-30 | Discharge: 2019-01-02 | DRG: 641 | Disposition: A | Discharge status: Home / Self Care | Payer: Self-pay | Attending: Internal Medicine | Admitting: Internal Medicine

## 2018-12-30 ENCOUNTER — Emergency Department: Payer: Self-pay

## 2018-12-30 DIAGNOSIS — R112 Nausea with vomiting, unspecified: Secondary | ICD-10-CM | POA: Diagnosis present

## 2018-12-30 DIAGNOSIS — Z79899 Other long term (current) drug therapy: Secondary | ICD-10-CM

## 2018-12-30 DIAGNOSIS — K2900 Acute gastritis without bleeding: Secondary | ICD-10-CM | POA: Diagnosis present

## 2018-12-30 DIAGNOSIS — N179 Acute kidney failure, unspecified: Secondary | ICD-10-CM | POA: Diagnosis present

## 2018-12-30 DIAGNOSIS — K219 Gastro-esophageal reflux disease without esophagitis: Secondary | ICD-10-CM | POA: Diagnosis present

## 2018-12-30 DIAGNOSIS — K529 Noninfective gastroenteritis and colitis, unspecified: Secondary | ICD-10-CM | POA: Diagnosis present

## 2018-12-30 DIAGNOSIS — Z1159 Encounter for screening for other viral diseases: Secondary | ICD-10-CM

## 2018-12-30 DIAGNOSIS — E860 Dehydration: Principal | ICD-10-CM | POA: Diagnosis present

## 2018-12-30 DIAGNOSIS — F17210 Nicotine dependence, cigarettes, uncomplicated: Secondary | ICD-10-CM | POA: Diagnosis present

## 2018-12-30 HISTORY — DX: Gastro-esophageal reflux disease without esophagitis: K21.9

## 2018-12-30 LAB — LIPASE, BLOOD: Lipase: 50 U/L (ref 11–51)

## 2018-12-30 LAB — URINALYSIS, COMPLETE (UACMP) WITH MICROSCOPIC
Bacteria, UA: NONE SEEN
Bilirubin Urine: NEGATIVE
Glucose, UA: NEGATIVE mg/dL
Hgb urine dipstick: NEGATIVE
Ketones, ur: NEGATIVE mg/dL
Nitrite: NEGATIVE
Protein, ur: NEGATIVE mg/dL
Specific Gravity, Urine: 1.028 (ref 1.005–1.030)
pH: 5 (ref 5.0–8.0)

## 2018-12-30 LAB — COMPREHENSIVE METABOLIC PANEL
ALT: 12 U/L (ref 0–44)
AST: 15 U/L (ref 15–41)
Albumin: 4.5 g/dL (ref 3.5–5.0)
Alkaline Phosphatase: 106 U/L (ref 38–126)
Anion gap: 11 (ref 5–15)
BUN: 54 mg/dL — ABNORMAL HIGH (ref 6–20)
CO2: 23 mmol/L (ref 22–32)
Calcium: 9.5 mg/dL (ref 8.9–10.3)
Chloride: 103 mmol/L (ref 98–111)
Creatinine, Ser: 1.45 mg/dL — ABNORMAL HIGH (ref 0.44–1.00)
GFR calc Af Amer: 49 mL/min — ABNORMAL LOW (ref >60)
GFR calc non Af Amer: 42 mL/min — ABNORMAL LOW (ref >60)
Glucose, Bld: 120 mg/dL — ABNORMAL HIGH (ref 70–99)
Potassium: 4.1 mmol/L (ref 3.5–5.1)
Sodium: 137 mmol/L (ref 135–145)
Total Bilirubin: 0.5 mg/dL (ref 0.3–1.2)
Total Protein: 7.3 g/dL (ref 6.5–8.1)

## 2018-12-30 LAB — URINE DRUG SCREEN, QUALITATIVE (ARMC ONLY)
Amphetamines, Ur Screen: NOT DETECTED
Barbiturates, Ur Screen: NOT DETECTED
Benzodiazepine, Ur Scrn: NOT DETECTED
Cannabinoid 50 Ng, Ur ~~LOC~~: NOT DETECTED
Cocaine Metabolite,Ur ~~LOC~~: NOT DETECTED
MDMA (Ecstasy)Ur Screen: NOT DETECTED
Methadone Scn, Ur: NOT DETECTED
Opiate, Ur Screen: POSITIVE — AB
Phencyclidine (PCP) Ur S: NOT DETECTED
Tricyclic, Ur Screen: POSITIVE — AB

## 2018-12-30 LAB — BASIC METABOLIC PANEL
Anion gap: 10 (ref 5–15)
BUN: 46 mg/dL — ABNORMAL HIGH (ref 6–20)
CO2: 20 mmol/L — ABNORMAL LOW (ref 22–32)
Calcium: 8.4 mg/dL — ABNORMAL LOW (ref 8.9–10.3)
Chloride: 109 mmol/L (ref 98–111)
Creatinine, Ser: 1.1 mg/dL — ABNORMAL HIGH (ref 0.44–1.00)
GFR calc Af Amer: >60 mL/min (ref >60)
GFR calc non Af Amer: 58 mL/min — ABNORMAL LOW (ref >60)
Glucose, Bld: 112 mg/dL — ABNORMAL HIGH (ref 70–99)
Potassium: 3.9 mmol/L (ref 3.5–5.1)
Sodium: 139 mmol/L (ref 135–145)

## 2018-12-30 LAB — CBC
HCT: 37.1 % (ref 36.0–46.0)
Hemoglobin: 12 g/dL (ref 12.0–15.0)
MCH: 28.8 pg (ref 26.0–34.0)
MCHC: 32.3 g/dL (ref 30.0–36.0)
MCV: 89.2 fL (ref 80.0–100.0)
Platelets: 407 10*3/uL — ABNORMAL HIGH (ref 150–400)
RBC: 4.16 MIL/uL (ref 3.87–5.11)
RDW: 14.4 % (ref 11.5–15.5)
WBC: 17.1 10*3/uL — ABNORMAL HIGH (ref 4.0–10.5)
nRBC: 0 % (ref 0.0–0.2)

## 2018-12-30 LAB — SARS CORONAVIRUS 2 BY RT PCR (HOSPITAL ORDER, PERFORMED IN ~~LOC~~ HOSPITAL LAB): SARS Coronavirus 2: NEGATIVE

## 2018-12-30 MED ORDER — ONDANSETRON 4 MG PO TBDP
4.0000 mg | ORAL_TABLET | Freq: Once | ORAL | Status: AC | PRN
  Administered 2018-12-30: 4 mg via ORAL
  Filled 2018-12-30: qty 1

## 2018-12-30 MED ORDER — HYDROCODONE-ACETAMINOPHEN 5-325 MG PO TABS
1.0000 | ORAL_TABLET | Freq: Once | ORAL | Status: AC
  Administered 2018-12-30: 1 via ORAL
  Filled 2018-12-30: qty 1

## 2018-12-30 MED ORDER — SODIUM CHLORIDE 0.9 % IV BOLUS
1000.0000 mL | Freq: Once | INTRAVENOUS | Status: AC
  Administered 2018-12-30: 1000 mL via INTRAVENOUS

## 2018-12-30 MED ORDER — DICYCLOMINE HCL 10 MG PO CAPS
10.0000 mg | ORAL_CAPSULE | Freq: Once | ORAL | Status: AC
  Administered 2018-12-30: 10 mg via ORAL
  Filled 2018-12-30: qty 1

## 2018-12-30 MED ORDER — MORPHINE SULFATE (PF) 4 MG/ML IV SOLN
4.0000 mg | INTRAVENOUS | Status: DC | PRN
  Administered 2018-12-30 – 2018-12-31 (×3): 4 mg via INTRAVENOUS
  Filled 2018-12-30 (×3): qty 1

## 2018-12-30 MED ORDER — IOHEXOL 300 MG/ML  SOLN
75.0000 mL | Freq: Once | INTRAMUSCULAR | Status: AC | PRN
  Administered 2018-12-30: 75 mL via INTRAVENOUS

## 2018-12-30 MED ORDER — PANTOPRAZOLE SODIUM 40 MG IV SOLR
40.0000 mg | Freq: Once | INTRAVENOUS | Status: AC
  Administered 2018-12-31: 40 mg via INTRAVENOUS
  Filled 2018-12-30: qty 40

## 2018-12-30 MED ORDER — HALOPERIDOL LACTATE 5 MG/ML IJ SOLN
5.0000 mg | Freq: Once | INTRAMUSCULAR | Status: AC
  Administered 2018-12-30: 5 mg via INTRAVENOUS
  Filled 2018-12-30: qty 1

## 2018-12-30 MED ORDER — SODIUM CHLORIDE 0.9% FLUSH
3.0000 mL | Freq: Once | INTRAVENOUS | Status: AC
  Administered 2018-12-30: 3 mL via INTRAVENOUS

## 2018-12-30 MED ORDER — PROMETHAZINE HCL 25 MG/ML IJ SOLN
12.5000 mg | Freq: Four times a day (QID) | INTRAMUSCULAR | Status: DC | PRN
  Administered 2018-12-30 – 2019-01-01 (×3): 12.5 mg via INTRAVENOUS
  Filled 2018-12-30 (×3): qty 1

## 2018-12-30 MED ORDER — SODIUM CHLORIDE 0.9 % IV BOLUS
500.0000 mL | Freq: Once | INTRAVENOUS | Status: AC
  Administered 2018-12-30: 500 mL via INTRAVENOUS

## 2018-12-30 NOTE — ED Provider Notes (Signed)
[facility] Emergency Department Provider Note    First MD Initiated Contact with Patient 12/30/18 1930     (approximate)  I have reviewed the triage vital signs and the nursing notes.   HISTORY  Chief Complaint Abdominal Pain and Emesis    HPI Ashley [name] is a 53 y.o. female the below listed past medical history presents the ER for 4 days of progressively worsening epigastric and right upper quadrant pain radiating through to her back associated with nausea and vomiting.  States that she has not moved her bowels in 2 days and is having feculent vomiting anytime she eats something.  Denies any previous surgeries.  Still has her gallbladder and her appendix.  Denies any dysuria.  No flank pain.    History reviewed. No pertinent past medical history. No family history on file. Past Surgical History:  Procedure Laterality Date  . TUBAL LIGATION     There are no active problems to display for this patient.     Prior to Admission medications   Medication Sig Start Date End Date Taking? Authorizing Provider  ibuprofen (ADVIL,MOTRIN) 200 MG tablet Take 800 mg by mouth every 6 (six) hours as needed for mild pain or moderate pain.    [provider]  ondansetron (ZOFRAN ODT) 8 MG disintegrating tablet Take 1 tablet (8 mg total) by mouth every 8 (eight) hours as needed for nausea or vomiting. 07/13/18   Cathren LaineSteinl, Kevin, MD  OVER THE COUNTER MEDICATION Take 1 tablet by mouth at bedtime as needed (sleep).    [provider]  pantoprazole (PROTONIX) 40 MG tablet Take 1 tablet (40 mg total) by mouth daily. 07/13/18   Cathren LaineSteinl, Kevin, MD  potassium chloride SA (K-DUR,KLOR-CON) 20 MEQ tablet Take 1 tablet (20 mEq total) by mouth daily. 07/13/18   Cathren LaineSteinl, Kevin, MD    Allergies Patient has no known allergies.    Social History Social History   Tobacco Use  . Smoking status: Current Every Day Smoker    Types: Cigarettes  . Smokeless tobacco:  Never Used  Substance Use Topics  . Alcohol use: No  . Drug use: No    Review of Systems Patient denies headaches, rhinorrhea, blurry vision, numbness, shortness of breath, chest pain, edema, cough, abdominal pain, nausea, vomiting, diarrhea, dysuria, fevers, rashes or hallucinations unless otherwise stated above in HPI. ____________________________________________   PHYSICAL EXAM:  VITAL SIGNS: Vitals:   12/30/18 1952 12/30/18 2140  BP: 118/69 120/67  Pulse: (!) 108 99  Resp: (!) 22 20  Temp:  98.6 F (37 C)  SpO2: 98% 99%    Constitutional: Alert and oriented.  Eyes: Conjunctivae are normal.  Head: Atraumatic. Nose: No congestion/rhinnorhea. Mouth/Throat: Mucous membranes are moist.   Neck: No stridor. Painless ROM.  Cardiovascular: Normal rate, regular rhythm. Grossly normal heart sounds.  Good peripheral circulation. Respiratory: Normal respiratory effort.  No retractions. Lungs CTAB. Gastrointestinal: Soft with mild ttp in epigastric region without rebound tenderness. No distention. No abdominal bruits. No CVA tenderness. Genitourinary:  Musculoskeletal: No lower extremity tenderness nor edema.  No joint effusions. Neurologic:  Normal speech and language. No gross focal neurologic deficits are appreciated. No facial droop Skin:  Skin is warm, dry and intact. No rash noted. Psychiatric: Mood and affect are normal. Speech and behavior are normal.  ____________________________________________   LABS (all labs ordered are listed, but only abnormal results are displayed)  Results for orders placed or performed during the hospital encounter of 12/30/18 (from  the past 24 hour(s))  Lipase, blood     Status: None   Collection Time: 12/30/18  5:03 PM  Result Value Ref Range   Lipase 50 11 - 51 U/L  Comprehensive metabolic panel     Status: Abnormal   Collection Time: 12/30/18  5:03 PM  Result Value Ref Range   Sodium 137 135 - 145 mmol/L   Potassium 4.1 3.5 - 5.1  mmol/L   Chloride 103 98 - 111 mmol/L   CO2 23 22 - 32 mmol/L   Glucose, Bld 120 (H) 70 - 99 mg/dL   BUN 54 (H) 6 - 20 mg/dL   Creatinine, Ser 1.611.45 (H) 0.44 - 1.00 mg/dL   Calcium 9.5 8.9 - 09.610.3 mg/dL   Total Protein 7.3 6.5 - 8.1 g/dL   Albumin 4.5 3.5 - 5.0 g/dL   AST 15 15 - 41 U/L   ALT 12 0 - 44 U/L   Alkaline Phosphatase 106 38 - 126 U/L   Total Bilirubin 0.5 0.3 - 1.2 mg/dL   GFR calc non Af Amer 42 (L) >60 mL/min   GFR calc Af Amer 49 (L) >60 mL/min   Anion gap 11 5 - 15  CBC     Status: Abnormal   Collection Time: 12/30/18  5:03 PM  Result Value Ref Range   WBC 17.1 (H) 4.0 - 10.5 K/uL   RBC 4.16 3.87 - 5.11 MIL/uL   Hemoglobin 12.0 12.0 - 15.0 g/dL   HCT 04.537.1 40.936.0 - 81.146.0 %   MCV 89.2 80.0 - 100.0 fL   MCH 28.8 26.0 - 34.0 pg   MCHC 32.3 30.0 - 36.0 g/dL   RDW 91.414.4 78.211.5 - 95.615.5 %   Platelets 407 (H) 150 - 400 K/uL   nRBC 0.0 0.0 - 0.2 %  Urinalysis, Complete w Microscopic     Status: Abnormal   Collection Time: 12/30/18  5:03 PM  Result Value Ref Range   Color, Urine YELLOW (A) YELLOW   APPearance CLOUDY (A) CLEAR   Specific Gravity, Urine 1.028 1.005 - 1.030   pH 5.0 5.0 - 8.0   Glucose, UA NEGATIVE NEGATIVE mg/dL   Hgb urine dipstick NEGATIVE NEGATIVE   Bilirubin Urine NEGATIVE NEGATIVE   Ketones, ur NEGATIVE NEGATIVE mg/dL   Protein, ur NEGATIVE NEGATIVE mg/dL   Nitrite NEGATIVE NEGATIVE   Leukocytes,Ua TRACE (A) NEGATIVE   RBC / HPF 0-5 0 - 5 RBC/hpf   WBC, UA 0-5 0 - 5 WBC/hpf   Bacteria, UA NONE SEEN NONE SEEN   Squamous Epithelial / LPF 6-10 0 - 5  Basic metabolic panel     Status: Abnormal   Collection Time: 12/30/18 10:17 PM  Result Value Ref Range   Sodium 139 135 - 145 mmol/L   Potassium 3.9 3.5 - 5.1 mmol/L   Chloride 109 98 - 111 mmol/L   CO2 20 (L) 22 - 32 mmol/L   Glucose, Bld 112 (H) 70 - 99 mg/dL   BUN 46 (H) 6 - 20 mg/dL   Creatinine, Ser 2.131.10 (H) 0.44 - 1.00 mg/dL   Calcium 8.4 (L) 8.9 - 10.3 mg/dL   GFR calc non Af Amer 58 (L) >60  mL/min   GFR calc Af Amer >60 >60 mL/min   Anion gap 10 5 - 15  Urine Drug Screen, Qualitative ([facility] only)     Status: Abnormal   Collection Time: 12/30/18 10:17 PM  Result Value Ref Range   Tricyclic, Ur Screen POSITIVE (A) NONE  DETECTED   Amphetamines, Ur Screen NONE DETECTED NONE DETECTED   MDMA (Ecstasy)Ur Screen NONE DETECTED NONE DETECTED   Cocaine Metabolite,Ur [place] NONE DETECTED NONE DETECTED   Opiate, Ur Screen POSITIVE (A) NONE DETECTED   Phencyclidine (PCP) Ur S NONE DETECTED NONE DETECTED   Cannabinoid 50 Ng, Ur  NONE DETECTED NONE DETECTED   Barbiturates, Ur Screen NONE DETECTED NONE DETECTED   Benzodiazepine, Ur Scrn NONE DETECTED NONE DETECTED   Methadone Scn, Ur NONE DETECTED NONE DETECTED   ____________________________________________ ____________________________________________  RADIOLOGY  I personally reviewed all radiographic images ordered to evaluate for the above acute complaints and reviewed radiology reports and findings.  These findings were personally discussed with the patient.  Please see medical record for radiology report.  ____________________________________________   PROCEDURES  Procedure(s) performed:  Procedures    Critical Care performed: no ____________________________________________   INITIAL IMPRESSION / ASSESSMENT AND PLAN / ED COURSE  Pertinent labs & imaging results that were available during my care of the patient were reviewed by me and considered in my medical decision making (see chart for details).   DDX: appendicitis, sbo, cholecystitis, pancreatitis, enteritis, uti, stone, mass  Ashley [name] is a 53 y.o. who presents to the ED with symptoms as described above.  Patient with significant epigastric discomfort nausea vomiting as described above.  We will give IV fluids as well as IV pain medication IV antiemetics.  Will order CT imaging to evaluate for the by differential.  Clinical Course as of Dec 29 2325  Tue Dec 30, 2018  2140 Patient states that she is still having crampy abdominal pain with nausea.  Blood work does show evidence of AKI and some dehydration but patient is nontoxic-appearing CT imaging is reassuring sure.  Concern for possible gastroparesis.  Will add on stool studies.  White count may be secondary to frequent vomiting.   [PR]  2326 Patient with persistent nausea and vomiting inability to tolerate p.o.  She is now been in the ER for 7hours receiving IV fluids as well as multiple antiemetics with persistent AKI despite IV fluids I do believe that at this point to be best to have the patient mid to the hospital for continued IV fluids and IV antiemetics.  Until she can be transitioned to oral medication.  Do not see any evidence of infectious process at this time.  COVID still pending.  Possible enteritis.  Will give IV Protonix for possible esophagitis or gastritis.   [PR]    Clinical Course User Index [PR] Merlyn Lot, MD    The patient was evaluated in Emergency Department today for the symptoms described in the history of present illness. He/she was evaluated in the context of the global COVID-19 pandemic, which necessitated consideration that the patient might be at risk for infection with the SARS-CoV-2 virus that causes COVID-19. Institutional protocols and algorithms that pertain to the evaluation of patients at risk for COVID-19 are in a state of rapid change based on information released by regulatory bodies including the CDC and federal and state organizations. These policies and algorithms were followed during the patient's care in the ED. c As part of my medical decision making, I reviewed the following data within the [place] notes reviewed and incorporated, Labs reviewed, notes from prior ED visits and [place] Controlled Substance Database   ____________________________________________   FINAL CLINICAL IMPRESSION(S) / ED DIAGNOSES  Final diagnoses:   Intractable vomiting with nausea, unspecified vomiting type  AKI (acute  kidney injury) (HCC)      NEW MEDICATIONS STARTED DURING THIS VISIT:  New Prescriptions   No medications on file     Note:  This document was prepared using Dragon voice recognition software and may include unintentional dictation errors.    Willy Eddyobinson, Tynslee Bowlds, MD 12/30/18 2328

## 2018-12-30 NOTE — ED Triage Notes (Signed)
Patient from home via EMS. Patient reports abdominal pain, nausea and vomiting x4 days. States she is vomiting up brown liquid. States last normal bowel movement was 2 days ago. Patient reports fever and chills at home.

## 2018-12-30 NOTE — ED Notes (Signed)
Off unit to CT abd.  

## 2018-12-30 NOTE — ED Notes (Signed)
Patient reports uanble to have bowel movement. Up out of bed to commode with assist. Requesting pain and nausea medications. As per Dr. Quentin Cornwall ok to give PRN medications placed. Normal saline bolus infusing.

## 2018-12-31 ENCOUNTER — Encounter: Payer: Self-pay | Admitting: Internal Medicine

## 2018-12-31 DIAGNOSIS — N179 Acute kidney failure, unspecified: Secondary | ICD-10-CM | POA: Diagnosis present

## 2018-12-31 DIAGNOSIS — K219 Gastro-esophageal reflux disease without esophagitis: Secondary | ICD-10-CM | POA: Diagnosis present

## 2018-12-31 DIAGNOSIS — R112 Nausea with vomiting, unspecified: Secondary | ICD-10-CM | POA: Diagnosis present

## 2018-12-31 LAB — CBC
HCT: 28.5 % — ABNORMAL LOW (ref 36.0–46.0)
Hemoglobin: 9.1 g/dL — ABNORMAL LOW (ref 12.0–15.0)
MCH: 29.1 pg (ref 26.0–34.0)
MCHC: 31.9 g/dL (ref 30.0–36.0)
MCV: 91.1 fL (ref 80.0–100.0)
Platelets: 275 10*3/uL (ref 150–400)
RBC: 3.13 MIL/uL — ABNORMAL LOW (ref 3.87–5.11)
RDW: 14.4 % (ref 11.5–15.5)
WBC: 11.1 10*3/uL — ABNORMAL HIGH (ref 4.0–10.5)
nRBC: 0 % (ref 0.0–0.2)

## 2018-12-31 LAB — BASIC METABOLIC PANEL
Anion gap: 7 (ref 5–15)
BUN: 37 mg/dL — ABNORMAL HIGH (ref 6–20)
CO2: 21 mmol/L — ABNORMAL LOW (ref 22–32)
Calcium: 8.4 mg/dL — ABNORMAL LOW (ref 8.9–10.3)
Chloride: 114 mmol/L — ABNORMAL HIGH (ref 98–111)
Creatinine, Ser: 0.83 mg/dL (ref 0.44–1.00)
GFR calc Af Amer: >60 mL/min (ref >60)
GFR calc non Af Amer: >60 mL/min (ref >60)
Glucose, Bld: 98 mg/dL (ref 70–99)
Potassium: 4 mmol/L (ref 3.5–5.1)
Sodium: 142 mmol/L (ref 135–145)

## 2018-12-31 MED ORDER — HYDROCODONE-ACETAMINOPHEN 5-325 MG PO TABS
1.0000 | ORAL_TABLET | ORAL | Status: DC | PRN
  Administered 2018-12-31 – 2019-01-02 (×11): 1 via ORAL
  Filled 2018-12-31 (×11): qty 1

## 2018-12-31 MED ORDER — NICOTINE 14 MG/24HR TD PT24
14.0000 mg | MEDICATED_PATCH | Freq: Every day | TRANSDERMAL | Status: DC
  Administered 2018-12-31 – 2019-01-02 (×3): 14 mg via TRANSDERMAL
  Filled 2018-12-31 (×3): qty 1

## 2018-12-31 MED ORDER — ENOXAPARIN SODIUM 40 MG/0.4ML ~~LOC~~ SOLN
40.0000 mg | SUBCUTANEOUS | Status: DC
  Administered 2018-12-31 – 2019-01-02 (×3): 40 mg via SUBCUTANEOUS
  Filled 2018-12-31 (×3): qty 0.4

## 2018-12-31 MED ORDER — SODIUM CHLORIDE 0.9 % IV SOLN
INTRAVENOUS | Status: AC
  Administered 2018-12-31 (×2): via INTRAVENOUS

## 2018-12-31 MED ORDER — ONDANSETRON HCL 4 MG PO TABS
4.0000 mg | ORAL_TABLET | Freq: Four times a day (QID) | ORAL | Status: DC | PRN
  Administered 2018-12-31: 4 mg via ORAL

## 2018-12-31 MED ORDER — TRAMADOL HCL 50 MG PO TABS
50.0000 mg | ORAL_TABLET | Freq: Four times a day (QID) | ORAL | Status: DC | PRN
  Administered 2018-12-31 – 2019-01-02 (×8): 50 mg via ORAL
  Filled 2018-12-31 (×8): qty 1

## 2018-12-31 MED ORDER — ACETAMINOPHEN 650 MG RE SUPP: 650.0000 mg | Freq: Four times a day (QID) | RECTAL | Status: DC | PRN

## 2018-12-31 MED ORDER — DICYCLOMINE HCL 10 MG PO CAPS
10.0000 mg | ORAL_CAPSULE | Freq: Four times a day (QID) | ORAL | Status: DC | PRN
  Administered 2018-12-31 – 2019-01-02 (×5): 10 mg via ORAL
  Filled 2018-12-31 (×6): qty 1

## 2018-12-31 MED ORDER — ONDANSETRON HCL 4 MG/2ML IJ SOLN
4.0000 mg | Freq: Four times a day (QID) | INTRAMUSCULAR | Status: DC | PRN
  Administered 2018-12-31 – 2019-01-02 (×5): 4 mg via INTRAVENOUS
  Filled 2018-12-31 (×6): qty 2

## 2018-12-31 MED ORDER — SODIUM CHLORIDE 0.9 % IV SOLN
INTRAVENOUS | Status: AC
  Administered 2018-12-31: 02:00:00 via INTRAVENOUS

## 2018-12-31 MED ORDER — ACETAMINOPHEN 325 MG PO TABS
650.0000 mg | ORAL_TABLET | Freq: Four times a day (QID) | ORAL | Status: DC | PRN
  Administered 2018-12-31: 650 mg via ORAL
  Filled 2018-12-31: qty 2

## 2018-12-31 NOTE — Progress Notes (Signed)
Family Meeting Note  Advance Directive:yes  Today a meeting took place with the Patient.    The following clinical team members were present during this meeting:MD  The following were discussed:Patient's diagnosis: Acute gastroenteritis with intractable nausea vomiting and diarrhea, GERD, treatment plan of care discussed in detail with the patient.  She verbalized understanding of the plan.  Admits smoking   patient's progosis: Unable to determine and Goals for treatment: Full Code  Friend Gerald Stabs fields is the healthcare power of attorney  Additional follow-up to be provided: Hospitalist  Time spent during discussion:17 min  Nicholes Mango, MD

## 2018-12-31 NOTE — H&P (Signed)
[facility] [place] [facility]   PATIENT NAME: Ashley GrimeLinda Torrance    MR#:  [id number]  DATE OF BIRTH:  [birth year]  DATE OF ADMISSION:  12/30/2018  PRIMARY CARE PHYSICIAN: Patient, No Pcp Per   REQUESTING/REFERRING PHYSICIAN: Roxan Hockeyobinson, MD  CHIEF COMPLAINT:   Chief Complaint  Patient presents with  . Abdominal Pain  . Emesis    HISTORY OF PRESENT ILLNESS:  Ashley [name]  is a 53 y.o. female who presents with chief complaint as above.  Presents the ED with a complaint of abdominal pain with nausea and vomiting for the past 3 days.  This is been getting worse, and was particularly bad today.  She also states that she has had some intermittent bouts of diarrhea.  Work-up here in the ED does not elucidate a clear etiology for her symptoms.  She does have some AKI, likely from mild dehydration from her vomiting.  Hospitalist called for admission  PAST MEDICAL HISTORY:   Past Medical History:  Diagnosis Date  . GERD (gastroesophageal reflux disease)      PAST SURGICAL HISTORY:   Past Surgical History:  Procedure Laterality Date  . TUBAL LIGATION       SOCIAL HISTORY:   Social History   Tobacco Use  . Smoking status: Current Every Day Smoker    Types: Cigarettes  . Smokeless tobacco: Never Used  Substance Use Topics  . Alcohol use: No     FAMILY HISTORY:    Family history reviewed and is non-contributory DRUG ALLERGIES:  No Known Allergies  MEDICATIONS AT HOME:   Prior to Admission medications   Medication Sig Start Date End Date Taking? Authorizing Provider  ibuprofen (ADVIL,MOTRIN) 200 MG tablet Take 800 mg by mouth every 6 (six) hours as needed for mild pain or moderate pain.    [provider]  ondansetron (ZOFRAN ODT) 8 MG disintegrating tablet Take 1 tablet (8 mg total) by mouth every 8 (eight) hours as needed for nausea or vomiting. 07/13/18   Cathren LaineSteinl, Kevin, MD  OVER THE COUNTER MEDICATION Take 1 tablet by mouth at bedtime  as needed (sleep).    [provider]  pantoprazole (PROTONIX) 40 MG tablet Take 1 tablet (40 mg total) by mouth daily. 07/13/18   Cathren LaineSteinl, Kevin, MD  potassium chloride SA (K-DUR,KLOR-CON) 20 MEQ tablet Take 1 tablet (20 mEq total) by mouth daily. 07/13/18   Cathren LaineSteinl, Kevin, MD    REVIEW OF SYSTEMS:  Review of Systems  Constitutional: Negative for chills, fever, malaise/fatigue and weight loss.  HENT: Negative for ear pain, hearing loss and tinnitus.   Eyes: Negative for blurred vision, double vision, pain and redness.  Respiratory: Negative for cough, hemoptysis and shortness of breath.   Cardiovascular: Negative for chest pain, palpitations, orthopnea and leg swelling.  Gastrointestinal: Positive for abdominal pain, diarrhea, nausea and vomiting. Negative for constipation.  Genitourinary: Negative for dysuria, frequency and hematuria.  Musculoskeletal: Negative for back pain, joint pain and neck pain.  Skin:       No acne, rash, or lesions  Neurological: Negative for dizziness, tremors, focal weakness and weakness.  Endo/Heme/Allergies: Negative for polydipsia. Does not bruise/bleed easily.  Psychiatric/Behavioral: Negative for depression. The patient is not nervous/anxious and does not have insomnia.      VITAL SIGNS:   Vitals:   12/30/18 1653 12/30/18 1654 12/30/18 1952 12/30/18 2140  BP: (!) 116/91  118/69 120/67  Pulse: 100  (!) 108 99  Resp: 18  (!) 22 20  Temp: 98.9 F (37.2 C)   98.6 F (37 C)  TempSrc: Oral   Oral  SpO2: 98%  98% 99%  Weight:  70.3 kg    Height:  5\' 4"  (1.626 m)     Wt Readings from Last 3 Encounters:  12/30/18 70.3 kg  07/13/18 70.3 kg  12/28/16 75.8 kg    PHYSICAL EXAMINATION:  Physical Exam  Vitals reviewed. Constitutional: She is oriented to person, place, and time. She appears well-developed and well-nourished. No distress.  HENT:  Head: Normocephalic and atraumatic.  Dry mucous membranes  Eyes: Pupils are equal, round, and  reactive to light. Conjunctivae and EOM are normal. No scleral icterus.  Neck: Normal range of motion. Neck supple. No JVD present. No thyromegaly present.  Cardiovascular: Normal rate, regular rhythm and intact distal pulses. Exam reveals no gallop and no friction rub.  No murmur heard. Respiratory: Effort normal and breath sounds normal. No respiratory distress. She has no wheezes. She has no rales.  GI: Soft. Bowel sounds are normal. She exhibits no distension. There is abdominal tenderness.  Musculoskeletal: Normal range of motion.        General: No edema.     Comments: No arthritis, no gout  Lymphadenopathy:    She has no cervical adenopathy.  Neurological: She is alert and oriented to person, place, and time. No cranial nerve deficit.  No dysarthria, no aphasia  Skin: Skin is warm and dry. No rash noted. No erythema.  Psychiatric: She has a normal mood and affect. Her behavior is normal. Judgment and thought content normal.    LABORATORY PANEL:   CBC Recent Labs  Lab 12/30/18 1703  WBC 17.1*  HGB 12.0  HCT 37.1  PLT 407*   ------------------------------------------------------------------------------------------------------------------  Chemistries  Recent Labs  Lab 12/30/18 1703 12/30/18 2217  NA 137 139  K 4.1 3.9  CL 103 109  CO2 23 20*  GLUCOSE 120* 112*  BUN 54* 46*  CREATININE 1.45* 1.10*  CALCIUM 9.5 8.4*  AST 15  --   ALT 12  --   ALKPHOS 106  --   BILITOT 0.5  --    ------------------------------------------------------------------------------------------------------------------  Cardiac Enzymes No results for input(s): TROPONINI in the last 168 hours. ------------------------------------------------------------------------------------------------------------------  RADIOLOGY:  Ct Abdomen Pelvis W Contrast  Result Date: 12/30/2018 CLINICAL DATA:  Abdominal pain, nausea and vomiting for 4 days. EXAM: CT ABDOMEN AND PELVIS WITH CONTRAST  TECHNIQUE: Multidetector CT imaging of the abdomen and pelvis was performed using the standard protocol following bolus administration of intravenous contrast. CONTRAST:  75mL OMNIPAQUE IOHEXOL 300 MG/ML  SOLN COMPARISON:  CT scan 07/13/2018 FINDINGS: Lower chest: The lung bases are clear of acute process. No pleural effusion or pulmonary lesions. The heart is normal in size. No pericardial effusion. The distal esophagus and aorta are unremarkable. Small hiatal hernia. Hepatobiliary: No focal hepatic lesions or intrahepatic biliary dilatation. Stable small hepatic cysts. The gallbladder is normal. No common bile duct dilatation. Pancreas: No mass, inflammation or ductal dilatation. Spleen: Normal size.  No focal lesions. Adrenals/Urinary Tract: Small bilateral adrenal gland lesions are stable and consistent with small adenomas. No renal, ureteral or bladder calculi or mass. Stomach/Bowel: The stomach, duodenum, small bowel and colon are unremarkable. No acute inflammatory changes, mass lesions or obstructive findings. The terminal ileum and appendix are normal. Vascular/Lymphatic: Age advanced atherosclerotic calcifications involving the aorta and iliac arteries but no aneurysm or dissection. The branch vessels are patent. The major venous structures are patent. No mesenteric or  retroperitoneal mass or adenopathy. Reproductive: The uterus and ovaries are unremarkable. Other: No pelvic mass or adenopathy. No free pelvic fluid collections. No inguinal mass or adenopathy. No abdominal wall hernia or subcutaneous lesions. 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 IMPRESSION: 1. No acute abdominal/pelvic findings, mass lesions or adenopathy. 2. Stable small hepatic cysts and adrenal gland adenomas. 3. Small hiatal hernia. 4. Chronic ununited left femoral neck fracture. Electronically Signed   By: Marijo Sanes M.D.   On: 12/30/2018 20:19    EKG:  No orders found for this or any previous  visit.  IMPRESSION AND PLAN:  Principal Problem:   Intractable nausea and vomiting -unclear etiology.  Perhaps viral pathology.  PRN antiemetics Active Problems:   AKI (acute kidney injury) ([place]) -likely related to profuse vomiting.  IV fluids tonight, avoid nephrotoxins and monitor for improvement   GERD (gastroesophageal reflux disease) -continue home meds  Chart review performed and case discussed with ED provider. Labs, imaging and/or ECG reviewed by provider and discussed with patient/family. Management plans discussed with the patient and/or family.  COVID-19 status: Tested negative     DVT PROPHYLAXIS: SubQ lovenox   GI PROPHYLAXIS:  PPI   ADMISSION STATUS: Observation  CODE STATUS: Full  TOTAL TIME TAKING CARE OF THIS PATIENT: 40 minutes.   This patient was evaluated in the context of the global COVID-19 pandemic, which necessitated consideration that the patient might be at risk for infection with the SARS-CoV-2 virus that causes COVID-19. Institutional protocols and algorithms that pertain to the evaluation of patients at risk for COVID-19 are in a state of rapid change based on information released by regulatory bodies including the CDC and federal and state organizations. These policies and algorithms were followed to the best of this provider's knowledge to date during the patient's care at this facility.  Ethlyn Daniels 12/31/2018, 12:41 AM  [facility]  Office  [phone]  CC: Primary care physician; Patient, No Pcp Per  Note:  This document was prepared using Dragon voice recognition software and may include unintentional dictation errors.

## 2018-12-31 NOTE — Progress Notes (Signed)
[facility] [place] at [facility]   PATIENT NAME: Ashley GrimeLinda [name]    MR#:  [id number]  DATE OF BIRTH:  1965/10/07  SUBJECTIVE:  CHIEF COMPLAINT: Nausea but vomiting significantly improved denies any diarrhea reporting abdominal pain in the epigastric area  REVIEW OF SYSTEMS:  CONSTITUTIONAL: No fever, fatigue or weakness.  EYES: No blurred or double vision.  EARS, NOSE, AND THROAT: No tinnitus or ear pain.  RESPIRATORY: No cough, shortness of breath, wheezing or hemoptysis.  CARDIOVASCULAR: No chest pain, orthopnea, edema.  GASTROINTESTINAL: Reports some nausea denies any vomiting.  Diarrhea improved and now reports epigastric abdominal pain  gENITOURINARY: No dysuria, hematuria.  ENDOCRINE: No polyuria, nocturia,  HEMATOLOGY: No anemia, easy bruising or bleeding SKIN: No rash or lesion. MUSCULOSKELETAL: No joint pain or arthritis.   NEUROLOGIC: No tingling, numbness, weakness.  PSYCHIATRY: No anxiety or depression.   DRUG ALLERGIES:  No Known Allergies  VITALS:  Blood pressure (!) 150/70, pulse 63, temperature 99 F (37.2 C), temperature source Oral, resp. rate 16, height 5\' 4"  (1.626 m), weight 64.7 kg, SpO2 99 %.  PHYSICAL EXAMINATION:  GENERAL:  53 y.o.-year-old patient lying in the bed with no acute distress.  EYES: Pupils equal, round, reactive to light and accommodation. No scleral icterus. Extraocular muscles intact.  HEENT: Head atraumatic, normocephalic. Oropharynx and nasopharynx clear.  NECK:  Supple, no jugular venous distention. No thyroid enlargement, no tenderness.  LUNGS: Normal breath sounds bilaterally, no wheezing, rales,rhonchi or crepitation. No use of accessory muscles of respiration.  CARDIOVASCULAR: S1, S2 normal. No murmurs, rubs, or gallops.  ABDOMEN: Soft, very mild epigastric tenderness no rebound tenderness no distention.  Bowel sounds are positive EXTREMITIES: No pedal edema, cyanosis, or clubbing.  NEUROLOGIC: Cranial  nerves II through XII are intact. Muscle strength 5/5 in all extremities. Sensation intact. Gait not checked.  PSYCHIATRIC: The patient is alert and oriented x 3.  SKIN: No obvious rash, lesion, or ulcer.    LABORATORY PANEL:   CBC Recent Labs  Lab 12/31/18 0325  WBC 11.1*  HGB 9.1*  HCT 28.5*  PLT 275   ------------------------------------------------------------------------------------------------------------------  Chemistries  Recent Labs  Lab 12/30/18 1703  12/31/18 0325  NA 137   < > 142  K 4.1   < > 4.0  CL 103   < > 114*  CO2 23   < > 21*  GLUCOSE 120*   < > 98  BUN 54*   < > 37*  CREATININE 1.45*   < > 0.83  CALCIUM 9.5   < > 8.4*  AST 15  --   --   ALT 12  --   --   ALKPHOS 106  --   --   BILITOT 0.5  --   --    < > = values in this interval not displayed.   ------------------------------------------------------------------------------------------------------------------  Cardiac Enzymes No results for input(s): TROPONINI in the last 168 hours. ------------------------------------------------------------------------------------------------------------------  RADIOLOGY:  Ct Abdomen Pelvis W Contrast  Result Date: 12/30/2018 CLINICAL DATA:  Abdominal pain, nausea and vomiting for 4 days. EXAM: CT ABDOMEN AND PELVIS WITH CONTRAST TECHNIQUE: Multidetector CT imaging of the abdomen and pelvis was performed using the standard protocol following bolus administration of intravenous contrast. CONTRAST:  75mL OMNIPAQUE IOHEXOL 300 MG/ML  SOLN COMPARISON:  CT scan 07/13/2018 FINDINGS: Lower chest: The lung bases are clear of acute process. No pleural effusion or pulmonary lesions. The heart is normal in size. No pericardial effusion. The distal esophagus and  aorta are unremarkable. Small hiatal hernia. Hepatobiliary: No focal hepatic lesions or intrahepatic biliary dilatation. Stable small hepatic cysts. The gallbladder is normal. No common bile duct dilatation. Pancreas:  No mass, inflammation or ductal dilatation. Spleen: Normal size.  No focal lesions. Adrenals/Urinary Tract: Small bilateral adrenal gland lesions are stable and consistent with small adenomas. No renal, ureteral or bladder calculi or mass. Stomach/Bowel: The stomach, duodenum, small bowel and colon are unremarkable. No acute inflammatory changes, mass lesions or obstructive findings. The terminal ileum and appendix are normal. Vascular/Lymphatic: Age advanced atherosclerotic calcifications involving the aorta and iliac arteries but no aneurysm or dissection. The branch vessels are patent. The major venous structures are patent. No mesenteric or retroperitoneal mass or adenopathy. Reproductive: The uterus and ovaries are unremarkable. Other: No pelvic mass or adenopathy. No free pelvic fluid collections. No inguinal mass or adenopathy. No abdominal wall hernia or subcutaneous lesions. 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 IMPRESSION: 1. No acute abdominal/pelvic findings, mass lesions or adenopathy. 2. Stable small hepatic cysts and adrenal gland adenomas. 3. Small hiatal hernia. 4. Chronic ununited left femoral neck fracture. Electronically Signed   By: Marijo Sanes M.D.   On: 12/30/2018 20:19    EKG:  No orders found for this or any previous visit.  ASSESSMENT AND PLAN:    Present acute gastroenteritis probably viral pathology Clinically improving Clear liquids, IV fluids and supportive treatment  AKI improved with IV fluids avoid nephrotoxins and monitor renal function closely  GERD-continue home meds  Tobacco abuse disorder counseled patient to quit smoking for 5 minutes.  She verbalized understanding of the plan.  Will provide nicotine patch.    All the records are reviewed and case discussed with Care Management/Social Workerr. Management plans discussed with the patient, family and they are in agreement.  CODE STATUS: Full code  TOTAL TIME TAKING  CARE OF THIS PATIENT: 35 minutes.   POSSIBLE D/C IN 1 DAYS, DEPENDING ON CLINICAL CONDITION.  Note: This dictation was prepared with Dragon dictation along with smaller phrase technology. Any transcriptional errors that result from this process are unintentional.   Nicholes Mango M.D on 12/31/2018 at 1:13 PM  Between 7am to 6pm - Pager - [phone] After 6pm go to [email or web address] - password [email or web address] [facility]  Office  [phone]  CC: Primary care physician; Patient, No Pcp Per

## 2018-12-31 NOTE — ED Notes (Addendum)
ED TO INPATIENT HANDOFF REPORT  ED Nurse Name and Phone #:   Elijah Birkom RN   [phone]  S Name/Age/Gender Ashley [name] 53 y.o. female Room/Bed: ED16A/ED16A  Code Status   Code Status: Not on file  Home/SNF/Other Home Patient oriented to: self, place, time and situation Is this baseline? Yes   Triage Complete: Triage complete  Chief Complaint Abd Pain  EMS  Triage Note Patient from home via EMS. Patient reports abdominal pain, nausea and vomiting x4 days. States she is vomiting up brown liquid. States last normal bowel movement was 2 days ago. Patient reports fever and chills at home.    Allergies No Known Allergies  Level of Care/Admitting Diagnosis ED Disposition    ED Disposition Condition Comment   Admit  Hospital Area: [facility] [[id number]]  Level of Care: Med-Surg [16]  Covid Evaluation: Confirmed COVID Negative  Diagnosis: Intractable nausea and vomiting [720114]  Admitting Physician: Oralia ManisWILLIS, DAVID [[id number]]  Attending Physician: Oralia ManisWILLIS, DAVID [[id number]]  PT Class (Do Not Modify): Observation [104]  PT Acc Code (Do Not Modify): Observation [10022]       B Medical/Surgery History Past Medical History:  Diagnosis Date  . GERD (gastroesophageal reflux disease)    Past Surgical History:  Procedure Laterality Date  . TUBAL LIGATION       A IV Location/Drains/Wounds Patient Lines/Drains/Airways Status   Active Line/Drains/Airways    Name:   Placement date:   Placement time:   Site:   Days:   Peripheral IV 12/30/18 Right Antecubital   12/30/18    1944    Antecubital   1   Peripheral IV 12/30/18 Antecubital   12/30/18    2240    Antecubital   1          Intake/Output Last 24 hours  Intake/Output Summary (Last 24 hours) at 12/31/2018 0131 Last data filed at 12/30/2018 2143 Gross per 24 hour  Intake 1500 ml  Output -  Net 1500 ml    Labs/Imaging Results for orders placed or performed during the hospital encounter of 12/30/18 (from the  past 48 hour(s))  Lipase, blood     Status: None   Collection Time: 12/30/18  5:03 PM  Result Value Ref Range   Lipase 50 11 - 51 U/L    Comment: Performed at [facility], [address] [place], [place] [address]  Comprehensive metabolic panel     Status: Abnormal   Collection Time: 12/30/18  5:03 PM  Result Value Ref Range   Sodium 137 135 - 145 mmol/L   Potassium 4.1 3.5 - 5.1 mmol/L   Chloride 103 98 - 111 mmol/L   CO2 23 22 - 32 mmol/L   Glucose, Bld 120 (H) 70 - 99 mg/dL   BUN 54 (H) 6 - 20 mg/dL   Creatinine, Ser 1.021.45 (H) 0.44 - 1.00 mg/dL   Calcium 9.5 8.9 - 72.510.3 mg/dL   Total Protein 7.3 6.5 - 8.1 g/dL   Albumin 4.5 3.5 - 5.0 g/dL   AST 15 15 - 41 U/L   ALT 12 0 - 44 U/L   Alkaline Phosphatase 106 38 - 126 U/L   Total Bilirubin 0.5 0.3 - 1.2 mg/dL   GFR calc non Af Amer 42 (L) >60 mL/min   GFR calc Af Amer 49 (L) >60 mL/min   Anion gap 11 5 - 15    Comment: Performed at [facility], [address] [place], [place] [address]  CBC  Status: Abnormal   Collection Time: 12/30/18  5:03 PM  Result Value Ref Range   WBC 17.1 (H) 4.0 - 10.5 K/uL   RBC 4.16 3.87 - 5.11 MIL/uL   Hemoglobin 12.0 12.0 - 15.0 g/dL   HCT 16.1 09.6 - 04.5 %   MCV 89.2 80.0 - 100.0 fL   MCH 28.8 26.0 - 34.0 pg   MCHC 32.3 30.0 - 36.0 g/dL   RDW 40.9 81.1 - 91.4 %   Platelets 407 (H) 150 - 400 K/uL   nRBC 0.0 0.0 - 0.2 %    Comment: Performed at [facility], [address] [place], [place] [address]  Urinalysis, Complete w Microscopic     Status: Abnormal   Collection Time: 12/30/18  5:03 PM  Result Value Ref Range   Color, Urine YELLOW (A) YELLOW   APPearance CLOUDY (A) CLEAR   Specific Gravity, Urine 1.028 1.005 - 1.030   pH 5.0 5.0 - 8.0   Glucose, UA NEGATIVE NEGATIVE mg/dL   Hgb urine dipstick NEGATIVE NEGATIVE   Bilirubin Urine NEGATIVE NEGATIVE   Ketones, ur NEGATIVE NEGATIVE mg/dL   Protein, ur NEGATIVE NEGATIVE mg/dL   Nitrite NEGATIVE  NEGATIVE   Leukocytes,Ua TRACE (A) NEGATIVE   RBC / HPF 0-5 0 - 5 RBC/hpf   WBC, UA 0-5 0 - 5 WBC/hpf   Bacteria, UA NONE SEEN NONE SEEN   Squamous Epithelial / LPF 6-10 0 - 5    Comment: Performed at [facility], [address] [place], [place] [address]  Basic metabolic panel     Status: Abnormal   Collection Time: 12/30/18 10:17 PM  Result Value Ref Range   Sodium 139 135 - 145 mmol/L   Potassium 3.9 3.5 - 5.1 mmol/L   Chloride 109 98 - 111 mmol/L   CO2 20 (L) 22 - 32 mmol/L   Glucose, Bld 112 (H) 70 - 99 mg/dL   BUN 46 (H) 6 - 20 mg/dL   Creatinine, Ser 8.65 (H) 0.44 - 1.00 mg/dL   Calcium 8.4 (L) 8.9 - 10.3 mg/dL   GFR calc non Af Amer 58 (L) >60 mL/min   GFR calc Af Amer >60 >60 mL/min   Anion gap 10 5 - 15    Comment: Performed at [facility], [address], [place], [place] [address]  Urine Drug Screen, Qualitative ([facility] only)     Status: Abnormal   Collection Time: 12/30/18 10:17 PM  Result Value Ref Range   Tricyclic, Ur Screen POSITIVE (A) NONE DETECTED   Amphetamines, Ur Screen NONE DETECTED NONE DETECTED   MDMA (Ecstasy)Ur Screen NONE DETECTED NONE DETECTED   Cocaine Metabolite,Ur [place] NONE DETECTED NONE DETECTED   Opiate, Ur Screen POSITIVE (A) NONE DETECTED   Phencyclidine (PCP) Ur S NONE DETECTED NONE DETECTED   Cannabinoid 50 Ng, Ur [place] NONE DETECTED NONE DETECTED   Barbiturates, Ur Screen NONE DETECTED NONE DETECTED   Benzodiazepine, Ur Scrn NONE DETECTED NONE DETECTED   Methadone Scn, Ur NONE DETECTED NONE DETECTED    Comment: (NOTE) Tricyclics + metabolites, urine    Cutoff 1000 ng/mL Amphetamines + metabolites, urine  Cutoff 1000 ng/mL MDMA (Ecstasy), urine              Cutoff 500 ng/mL Cocaine Metabolite, urine          Cutoff 300 ng/mL Opiate + metabolites, urine        Cutoff 300 ng/mL Phencyclidine (PCP), urine  Cutoff 25 ng/mL Cannabinoid, urine                 Cutoff 50 ng/mL Barbiturates + metabolites, urine  Cutoff  200 ng/mL Benzodiazepine, urine              Cutoff 200 ng/mL Methadone, urine                   Cutoff 300 ng/mL The urine drug screen provides only a preliminary, unconfirmed analytical test result and should not be used for non-medical purposes. Clinical consideration and professional judgment should be applied to any positive drug screen result due to possible interfering substances. A more specific alternate chemical method must be used in order to obtain a confirmed analytical result. Gas chromatography / mass spectrometry (GC/MS) is the preferred confirmat ory method. Performed at [facility], [address] [place], [place] [address]   SARS Coronavirus 2 (CEPHEID- Performed in [facility] hospital lab), Hosp Order     Status: None   Collection Time: 12/30/18 10:17 PM   Specimen: Nasopharyngeal Swab  Result Value Ref Range   SARS Coronavirus 2 NEGATIVE NEGATIVE    Comment: (NOTE) If result is NEGATIVE SARS-CoV-2 target nucleic acids are NOT DETECTED. The SARS-CoV-2 RNA is generally detectable in upper and lower  respiratory specimens during the acute phase of infection. The lowest  concentration of SARS-CoV-2 viral copies this assay can detect is 250  copies / mL. A negative result does not preclude SARS-CoV-2 infection  and should not be used as the sole basis for treatment or other  patient management decisions.  A negative result may occur with  improper specimen collection / handling, submission of specimen other  than nasopharyngeal swab, presence of viral mutation(s) within the  areas targeted by this assay, and inadequate number of viral copies  (<250 copies / mL). A negative result must be combined with clinical  observations, patient history, and epidemiological information. If result is POSITIVE SARS-CoV-2 target nucleic acids are DETECTED. The SARS-CoV-2 RNA is generally detectable in upper and lower  respiratory specimens dur ing the acute phase of  infection.  Positive  results are indicative of active infection with SARS-CoV-2.  Clinical  correlation with patient history and other diagnostic information is  necessary to determine patient infection status.  Positive results do  not rule out bacterial infection or co-infection with other viruses. If result is PRESUMPTIVE POSTIVE SARS-CoV-2 nucleic acids MAY BE PRESENT.   A presumptive positive result was obtained on the submitted specimen  and confirmed on repeat testing.  While 2019 novel coronavirus  (SARS-CoV-2) nucleic acids may be present in the submitted sample  additional confirmatory testing may be necessary for epidemiological  and / or clinical management purposes  to differentiate between  SARS-CoV-2 and other Sarbecovirus currently known to infect humans.  If clinically indicated additional testing with an alternate test  methodology [phone] is advised. The SARS-CoV-2 RNA is generally  detectable in upper and lower respiratory sp ecimens during the acute  phase of infection. The expected result is Negative. Fact Sheet for Patients:  BoilerBrush.com.cyhttps://www.fda.gov/media/136312/download Fact Sheet for Healthcare Providers: https://pope.com/https://www.fda.gov/media/136313/download This test is not yet approved or cleared by the [place] FDA and has been authorized for detection and/or diagnosis of SARS-CoV-2 by FDA under an Emergency Use Authorization (EUA).  This EUA will remain in effect (meaning this test can be used) for the duration of the COVID-19 declaration under Section 564(b)(1) of the Act, 21 U.S.C. section  360bbb-3(b)(1), unless the authorization is terminated or revoked sooner. Performed at [facility], [address] [place],  [address]    Ct Abdomen Pelvis W Contrast  Result Date: 12/30/2018 CLINICAL DATA:  Abdominal pain, nausea and vomiting for 4 days. EXAM: CT ABDOMEN AND PELVIS WITH CONTRAST TECHNIQUE: Multidetector CT imaging of the abdomen and  pelvis was performed using the standard protocol following bolus administration of intravenous contrast. CONTRAST:  33mL OMNIPAQUE IOHEXOL 300 MG/ML  SOLN COMPARISON:  CT scan 07/13/2018 FINDINGS: Lower chest: The lung bases are clear of acute process. No pleural effusion or pulmonary lesions. The heart is normal in size. No pericardial effusion. The distal esophagus and aorta are unremarkable. Small hiatal hernia. Hepatobiliary: No focal hepatic lesions or intrahepatic biliary dilatation. Stable small hepatic cysts. The gallbladder is normal. No common bile duct dilatation. Pancreas: No mass, inflammation or ductal dilatation. Spleen: Normal size.  No focal lesions. Adrenals/Urinary Tract: Small bilateral adrenal gland lesions are stable and consistent with small adenomas. No renal, ureteral or bladder calculi or mass. Stomach/Bowel: The stomach, duodenum, small bowel and colon are unremarkable. No acute inflammatory changes, mass lesions or obstructive findings. The terminal ileum and appendix are normal. Vascular/Lymphatic: Age advanced atherosclerotic calcifications involving the aorta and iliac arteries but no aneurysm or dissection. The branch vessels are patent. The major venous structures are patent. No mesenteric or retroperitoneal mass or adenopathy. Reproductive: The uterus and ovaries are unremarkable. Other: No pelvic mass or adenopathy. No free pelvic fluid collections. No inguinal mass or adenopathy. No abdominal wall hernia or subcutaneous lesions. 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 IMPRESSION: 1. No acute abdominal/pelvic findings, mass lesions or adenopathy. 2. Stable small hepatic cysts and adrenal gland adenomas. 3. Small hiatal hernia. 4. Chronic ununited left femoral neck fracture. Electronically Signed   By: Marijo Sanes M.D.   On: 12/30/2018 20:19    Pending Labs Unresulted Labs (From admission, onward)    Start     Ordered   12/30/18 2126  C  difficile quick scan w PCR reflex  (C Difficile quick screen w PCR reflex panel)  Once,   STAT     12/30/18 2125   12/30/18 2126  Gastrointestinal Panel by PCR , Stool  (Gastrointestinal Panel by PCR, Stool)  Once,   STAT     12/30/18 2125   Signed and Held  HIV antibody (Routine Testing)  Once,   R     Signed and Held   Signed and Held  CBC  (enoxaparin (LOVENOX)    CrCl >/= 30 ml/min)  Once,   R    Comments: Baseline for enoxaparin therapy IF NOT ALREADY DRAWN.  Notify MD if PLT < 100 K.    Signed and Held   Signed and Held  Creatinine, serum  (enoxaparin (LOVENOX)    CrCl >/= 30 ml/min)  Once,   R    Comments: Baseline for enoxaparin therapy IF NOT ALREADY DRAWN.    Signed and Held   Signed and Held  Creatinine, serum  (enoxaparin (LOVENOX)    CrCl >/= 30 ml/min)  Weekly,   R    Comments: while on enoxaparin therapy    Signed and Held   Signed and Held  Basic metabolic panel  Tomorrow morning,   R     Signed and Held   Signed and Held  CBC  Tomorrow morning,   R     Signed and Held  Vitals/Pain Today's Vitals   12/30/18 2140 12/30/18 2216 12/31/18 0052 12/31/18 0053  BP: 120/67  (!) 142/85   Pulse: 99  74   Resp: 20  20   Temp: 98.6 F (37 C)     TempSrc: Oral     SpO2: 99%  99%   Weight:      Height:      PainSc: 10-Worst pain ever 9  8  8      Isolation Precautions No active isolations  Medications Medications  promethazine (PHENERGAN) injection 12.5 mg (12.5 mg Intravenous Given 12/30/18 2144)  morphine 4 MG/ML injection 4 mg (4 mg Intravenous Given 12/31/18 0103)  sodium chloride flush (NS) 0.9 % injection 3 mL (3 mLs Intravenous Given 12/30/18 1944)  ondansetron (ZOFRAN-ODT) disintegrating tablet 4 mg (4 mg Oral Given 12/30/18 1702)  sodium chloride 0.9 % bolus 1,000 mL (1,000 mLs Intravenous New Bag/Given 12/30/18 1952)  iohexol (OMNIPAQUE) 300 MG/ML solution 75 mL (75 mLs Intravenous Contrast Given 12/30/18 1954)  sodium chloride 0.9 % bolus 500 mL (0  mLs Intravenous Stopped 12/30/18 2143)  haloperidol lactate (HALDOL) injection 5 mg (5 mg Intravenous Given 12/30/18 2146)  sodium chloride 0.9 % bolus 1,000 mL (0 mLs Intravenous Stopped 12/30/18 2143)  HYDROcodone-acetaminophen (NORCO/VICODIN) 5-325 MG per tablet 1 tablet (1 tablet Oral Given 12/30/18 2306)  dicyclomine (BENTYL) capsule 10 mg (10 mg Oral Given 12/30/18 2306)  pantoprazole (PROTONIX) injection 40 mg (40 mg Intravenous Given 12/31/18 0046)    Mobility walks Low fall risk   Focused Assessments Cardiac Assessment Handoff:    No results found for: CKTOTAL, CKMB, CKMBINDEX, TROPONINI No results found for: DDIMER Does the Patient currently have chest pain? No      R Recommendations: See Admitting Provider Note  Report given to: Selena BattenKim RN  Additional Notes:

## 2019-01-01 DIAGNOSIS — K529 Noninfective gastroenteritis and colitis, unspecified: Secondary | ICD-10-CM | POA: Diagnosis present

## 2019-01-01 LAB — URINE CULTURE: Culture: <10000 — AB

## 2019-01-01 LAB — HIV ANTIBODY (ROUTINE TESTING W REFLEX): HIV Screen 4th Generation wRfx: NONREACTIVE

## 2019-01-01 LAB — GLUCOSE, CAPILLARY: Glucose-Capillary: 97 mg/dL (ref 70–99)

## 2019-01-01 MED ORDER — MORPHINE SULFATE (PF) 2 MG/ML IV SOLN
2.0000 mg | Freq: Once | INTRAVENOUS | Status: AC
  Administered 2019-01-01: 11:00:00 2 mg via INTRAVENOUS
  Filled 2019-01-01: qty 1

## 2019-01-01 MED ORDER — PANTOPRAZOLE SODIUM 40 MG PO TBEC
40.0000 mg | DELAYED_RELEASE_TABLET | Freq: Every day | ORAL | Status: DC
  Administered 2019-01-01 – 2019-01-02 (×2): 40 mg via ORAL
  Filled 2019-01-01 (×2): qty 1

## 2019-01-01 MED ORDER — SODIUM CHLORIDE 0.9 % IV SOLN
INTRAVENOUS | Status: DC
  Administered 2019-01-01 – 2019-01-02 (×3): via INTRAVENOUS

## 2019-01-01 NOTE — Progress Notes (Addendum)
[facility] - [place] [facility]   PATIENT NAME: Andrez GrimeLinda Shedrick    MR#:  [id number]  DATE OF BIRTH:  [birth year]  SUBJECTIVE:  CHIEF COMPLAINT:   Chief Complaint  Patient presents with  . Abdominal Pain  . Emesis  Patient seen and evaluated today Still has some nausea and episodes of vomiting Did not tolerate liquid diet yesterday evening No new episodes of diarrhea Abdominal pain present  REVIEW OF SYSTEMS:    ROS  CONSTITUTIONAL: No documented fever. No fatigue, weakness. No weight gain, no weight loss.  EYES: No blurry or double vision.  ENT: No tinnitus. No postnasal drip. No redness of the oropharynx.  RESPIRATORY: No cough, no wheeze, no hemoptysis. No dyspnea.  CARDIOVASCULAR: No chest pain. No orthopnea. No palpitations. No syncope.  GASTROINTESTINAL: Has nausea, has vomiting , no diarrhea. Has abdominal pain. No melena or hematochezia.  GENITOURINARY: No dysuria or hematuria.  ENDOCRINE: No polyuria or nocturia. No heat or cold intolerance.  HEMATOLOGY: No anemia. No bruising. No bleeding.  INTEGUMENTARY: No rashes. No lesions.  MUSCULOSKELETAL: No arthritis. No swelling. No gout.  NEUROLOGIC: No numbness, tingling, or ataxia. No seizure-type activity.  PSYCHIATRIC: No anxiety. No insomnia. No ADD.   DRUG ALLERGIES:  No Known Allergies  VITALS:  Blood pressure (!) 152/73, pulse 80, temperature 98 F (36.7 C), temperature source Oral, resp. rate 16, height 5\' 4"  (1.626 m), weight 64.7 kg, SpO2 99 %.  PHYSICAL EXAMINATION:   Physical Exam  GENERAL:  53 y.o.-year-old patient lying in the bed with no acute distress.  EYES: Pupils equal, round, reactive to light and accommodation. No scleral icterus. Extraocular muscles intact.  HEENT: Head atraumatic, normocephalic. Oropharynx dry and nasopharynx clear.  NECK:  Supple, no jugular venous distention. No thyroid enlargement, no tenderness.  LUNGS: Normal breath sounds bilaterally, no wheezing,  rales, rhonchi. No use of accessory muscles of respiration.  CARDIOVASCULAR: S1, S2 normal. No murmurs, rubs, or gallops.  ABDOMEN: Soft, mild tenderness around umbilicus, nondistended. Bowel sounds present. No organomegaly or mass.  EXTREMITIES: No cyanosis, clubbing or edema b/l.    NEUROLOGIC: Cranial nerves II through XII are intact. No focal Motor or sensory deficits b/l.   PSYCHIATRIC: The patient is alert and oriented x 3.  SKIN: No obvious rash, lesion, or ulcer.   LABORATORY PANEL:   CBC Recent Labs  Lab 12/31/18 0325  WBC 11.1*  HGB 9.1*  HCT 28.5*  PLT 275   ------------------------------------------------------------------------------------------------------------------ Chemistries  Recent Labs  Lab 12/30/18 1703  12/31/18 0325  NA 137   < > 142  K 4.1   < > 4.0  CL 103   < > 114*  CO2 23   < > 21*  GLUCOSE 120*   < > 98  BUN 54*   < > 37*  CREATININE 1.45*   < > 0.83  CALCIUM 9.5   < > 8.4*  AST 15  --   --   ALT 12  --   --   ALKPHOS 106  --   --   BILITOT 0.5  --   --    < > = values in this interval not displayed.   ------------------------------------------------------------------------------------------------------------------  Cardiac Enzymes No results for input(s): TROPONINI in the last 168 hours. ------------------------------------------------------------------------------------------------------------------  RADIOLOGY:  Ct Abdomen Pelvis W Contrast  Result Date: 12/30/2018 CLINICAL DATA:  Abdominal pain, nausea and vomiting for 4 days. EXAM: CT ABDOMEN AND PELVIS WITH CONTRAST TECHNIQUE: Multidetector CT imaging of the  abdomen and pelvis was performed using the standard protocol following bolus administration of intravenous contrast. CONTRAST:  19mL OMNIPAQUE IOHEXOL 300 MG/ML  SOLN COMPARISON:  CT scan 07/13/2018 FINDINGS: Lower chest: The lung bases are clear of acute process. No pleural effusion or pulmonary lesions. The heart is normal in  size. No pericardial effusion. The distal esophagus and aorta are unremarkable. Small hiatal hernia. Hepatobiliary: No focal hepatic lesions or intrahepatic biliary dilatation. Stable small hepatic cysts. The gallbladder is normal. No common bile duct dilatation. Pancreas: No mass, inflammation or ductal dilatation. Spleen: Normal size.  No focal lesions. Adrenals/Urinary Tract: Small bilateral adrenal gland lesions are stable and consistent with small adenomas. No renal, ureteral or bladder calculi or mass. Stomach/Bowel: The stomach, duodenum, small bowel and colon are unremarkable. No acute inflammatory changes, mass lesions or obstructive findings. The terminal ileum and appendix are normal. Vascular/Lymphatic: Age advanced atherosclerotic calcifications involving the aorta and iliac arteries but no aneurysm or dissection. The branch vessels are patent. The major venous structures are patent. No mesenteric or retroperitoneal mass or adenopathy. Reproductive: The uterus and ovaries are unremarkable. Other: No pelvic mass or adenopathy. No free pelvic fluid collections. No inguinal mass or adenopathy. No abdominal wall hernia or subcutaneous lesions. 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 IMPRESSION: 1. No acute abdominal/pelvic findings, mass lesions or adenopathy. 2. Stable small hepatic cysts and adrenal gland adenomas. 3. Small hiatal hernia. 4. Chronic ununited left femoral neck fracture. Electronically Signed   By: Marijo Sanes M.D.   On: 12/30/2018 20:19     ASSESSMENT AND PLAN:  53 year old female patient with history of GERD presented to the emergency room for abdominal discomfort and diarrhea and nausea and vomiting  -Intractable nausea vomiting With poor oral intake Antiemetics and IV fluids Needs IV fluids Advance to oral soft diet only if nausea and vomiting improves  -Acute gastritis Oral proton pump inhibitor  -Acute gastroenteritis Diarrhea C.  difficile toxin pending Enteric precautions continue  -Dehydration secondary to poor oral intake IV fluids aggressively  -Acute kidney injury secondary to dehydration Monitor renal function with IV fluids Avoid nephrotoxic meds  All the records are reviewed and case discussed with Care Management/Social Worker. Management plans discussed with the patient, family and they are in agreement.  CODE STATUS: Full code  DVT Prophylaxis: SCDs  TOTAL TIME TAKING CARE OF THIS PATIENT: 37 minutes.   POSSIBLE D/C IN 1 to 2 DAYS, DEPENDING ON CLINICAL CONDITION.  Saundra Shelling M.D on 01/01/2019 at 12:47 PM  Between 7am to 6pm - Pager - [phone]  After 6pm go to [email or web address] - password [email or web address] [facility]  Office  [phone]  CC: Primary care physician; Patient, No Pcp Per  Note: This dictation was prepared with Dragon dictation along with smaller phrase technology. Any transcriptional errors that result from this process are unintentional.

## 2019-01-01 NOTE — Progress Notes (Signed)
C. Diff order has been D/C approved by Dr. Estanislado Pandy.

## 2019-01-01 NOTE — Plan of Care (Signed)
The patient has had a better day today after pain was managed this morning. Tolerating soft diet and no vomit was report. IV fluids restarted. The patient had a shower. The patient still requesting prn pain meds and nausea meds.  Problem: Education: Goal: Knowledge of General Education information will improve Description: Including pain rating scale, medication(s)/side effects and non-pharmacologic comfort measures Outcome: Progressing   Problem: Health Behavior/Discharge Planning: Goal: Ability to manage health-related needs will improve Outcome: Progressing   Problem: Clinical Measurements: Goal: Ability to maintain clinical measurements within normal limits will improve Outcome: Progressing Goal: Will remain free from infection Outcome: Progressing Goal: Diagnostic test results will improve Outcome: Progressing Goal: Respiratory complications will improve Outcome: Progressing Goal: Cardiovascular complication will be avoided Outcome: Progressing   Problem: Activity: Goal: Risk for activity intolerance will decrease Outcome: Progressing   Problem: Nutrition: Goal: Adequate nutrition will be maintained Outcome: Progressing   Problem: Coping: Goal: Level of anxiety will decrease Outcome: Progressing   Problem: Elimination: Goal: Will not experience complications related to bowel motility Outcome: Progressing Goal: Will not experience complications related to urinary retention Outcome: Progressing   Problem: Pain Managment: Goal: General experience of comfort will improve Outcome: Progressing   Problem: Safety: Goal: Ability to remain free from injury will improve Outcome: Progressing   Problem: Skin Integrity: Goal: Risk for impaired skin integrity will decrease Outcome: Progressing

## 2019-01-01 NOTE — Progress Notes (Signed)
MD notified: The patient's pain level is 10/10 and is requesting additional pain medications. She was given tramadol  at 0706 which is Q6 and Norco at 0900 which is Q4.

## 2019-01-01 NOTE — Progress Notes (Signed)
Advanced care plan. Purpose of the Encounter: CODE STATUS Parties in Attendance: Patient Patient's Decision Capacity: Good Subjective/Patient's story: Ashley [name]  is a 53 y.o. female who presents with chief complaint as above.  Presents the ED with a complaint of abdominal pain with nausea and vomiting for the past 3 days.  This is been getting worse, and was particularly bad today.  She also states that she has had some intermittent bouts of diarrhea.  Work-up here in the ED does not elucidate a clear etiology for her symptoms.  She does have some AKI, likely from mild dehydration from her vomiting.  Hospitalist called for admission Objective/Medical story Patient needs work-up for diarrhea and IV fluids for dehydration.  Monitor renal function.  Needs antiemetic medication intravenously to control nausea and vomiting. Goals of care determination:  Advance care directives goals of care and treatment plan discussed For now patient wants everything done which includes CPR, intubation ventilator the need arises CODE STATUS: Full code Time spent discussing advanced care planning: 16 minutes

## 2019-01-02 MED ORDER — ONDANSETRON HCL 4 MG PO TABS: 4.0000 mg | ORAL_TABLET | Freq: Four times a day (QID) | ORAL | 0 refills | Status: DC | PRN

## 2019-01-02 MED ORDER — NICOTINE 14 MG/24HR TD PT24: 14.0000 mg | MEDICATED_PATCH | Freq: Every day | TRANSDERMAL | 0 refills | Status: DC

## 2019-01-02 MED ORDER — HYDROCODONE-ACETAMINOPHEN 5-325 MG PO TABS: 1.0000 | ORAL_TABLET | ORAL | 0 refills | Status: DC | PRN

## 2019-01-02 MED ORDER — DICYCLOMINE HCL 10 MG PO CAPS: 10.0000 mg | ORAL_CAPSULE | Freq: Four times a day (QID) | ORAL | 0 refills | Status: DC | PRN

## 2019-01-02 MED ORDER — PANTOPRAZOLE SODIUM 40 MG PO TBEC: 40.0000 mg | DELAYED_RELEASE_TABLET | Freq: Every day | ORAL | 0 refills | Status: DC

## 2019-01-02 NOTE — Discharge Summary (Signed)
[facility] NAME: Ashley [name]    MR#:  [id number]  DATE OF BIRTH:  16-May-1966  DATE OF ADMISSION:  12/30/2018 ADMITTING PHYSICIAN: Lance Coon, MD  DATE OF DISCHARGE: 01/02/2019  PRIMARY CARE PHYSICIAN: Patient, No Pcp Per   ADMISSION DIAGNOSIS:  AKI (acute kidney injury) ([place]) [N17.9] Intractable vomiting with nausea, unspecified vomiting type [R11.2]  DISCHARGE DIAGNOSIS:  Principal Problem:   Intractable nausea and vomiting Active Problems:   AKI (acute kidney injury) ([place])   GERD (gastroesophageal reflux disease)   Acute gastroenteritis Tobacco abuse Dehydration SECONDARY DIAGNOSIS:   Past Medical History:  Diagnosis Date  . GERD (gastroesophageal reflux disease)      ADMITTING HISTORY Ashley [name]  is a 53 y.o. female who presents with chief complaint as above.  Presents the ED with a complaint of abdominal pain with nausea and vomiting for the past 3 days.  This is been getting worse, and was particularly bad today.  She also states that she has had some intermittent bouts of diarrhea.  Work-up here in the ED does not elucidate a clear etiology for her symptoms.  She does have some AKI, likely from mild dehydration from her vomiting.  Hospitalist called for admission  HOSPITAL COURSE:  Patient admitted to medical floor.  Was aggressively hydrated with IV fluids and her kidney functions improved with IV fluid resuscitation.  Antiemetic medication was given intravenously nausea and vomiting were controlled.  Patient's gastroenteritis improved she tolerated oral diet.  Tobacco cessation was counseled to the patient for 6 minutes nicotine patch offered.  Her diarrhea was self-limiting.  Leukocytosis was secondary to dehydration which improved.  COVID-19 test was negative.  U and culture was insignificant growth.  Tobacco cessation counseled to the patient for 6 minutes.  Nicotine patch offered.  CONSULTS OBTAINED:    DRUG  ALLERGIES:  No Known Allergies  DISCHARGE MEDICATIONS:   Allergies as of 01/02/2019   No Known Allergies     Medication List    STOP taking these medications   diphenhydramine-acetaminophen 25-500 MG Tabs tablet Commonly known as: TYLENOL PM   ibuprofen 200 MG tablet Commonly known as: ADVIL     TAKE these medications   dicyclomine 10 MG capsule Commonly known as: BENTYL Take 1 capsule (10 mg total) by mouth every 6 (six) hours as needed for spasms.   HYDROcodone-acetaminophen 5-325 MG tablet Commonly known as: NORCO/VICODIN Take 1 tablet by mouth every 4 (four) hours as needed for severe pain.   nicotine 14 mg/24hr patch Commonly known as: NICODERM CQ - dosed in mg/24 hours Place 1 patch (14 mg total) onto the skin daily. Start taking on: January 03, 2019   ondansetron 4 MG tablet Commonly known as: ZOFRAN Take 1 tablet (4 mg total) by mouth every 6 (six) hours as needed for nausea.   pantoprazole 40 MG tablet Commonly known as: PROTONIX Take 1 tablet (40 mg total) by mouth daily. Start taking on: January 03, 2019       Today  Patient seen today Tolerating diet well No nausea vomiting Hemodynamically stable  VITAL SIGNS:  Blood pressure 97/64, pulse 64, temperature 97.6 F (36.4 C), temperature source Oral, resp. rate 16, height 5\' 4"  (1.626 m), weight 64.7 kg, SpO2 99 %.  I/O:    Intake/Output Summary (Last 24 hours) at 01/02/2019 1054 Last data filed at 01/02/2019 0100 Gross per 24 hour  Intake 1304.8 ml  Output 700 ml  Net 604.8  ml    PHYSICAL EXAMINATION:  Physical Exam  GENERAL:  53 y.o.-year-old patient lying in the bed with no acute distress.  LUNGS: Normal breath sounds bilaterally, no wheezing, rales,rhonchi or crepitation. No use of accessory muscles of respiration.  CARDIOVASCULAR: S1, S2 normal. No murmurs, rubs, or gallops.  ABDOMEN: Soft, non-tender, non-distended. Bowel sounds present. No organomegaly or mass.  NEUROLOGIC: Moves all 4  extremities. PSYCHIATRIC: The patient is alert and oriented x 3.  SKIN: No obvious rash, lesion, or ulcer.   DATA REVIEW:   CBC Recent Labs  Lab 12/31/18 0325  WBC 11.1*  HGB 9.1*  HCT 28.5*  PLT 275    Chemistries  Recent Labs  Lab 12/30/18 1703  12/31/18 0325  NA 137   < > 142  K 4.1   < > 4.0  CL 103   < > 114*  CO2 23   < > 21*  GLUCOSE 120*   < > 98  BUN 54*   < > 37*  CREATININE 1.45*   < > 0.83  CALCIUM 9.5   < > 8.4*  AST 15  --   --   ALT 12  --   --   ALKPHOS 106  --   --   BILITOT 0.5  --   --    < > = values in this interval not displayed.    Cardiac Enzymes No results for input(s): TROPONINI in the last 168 hours.  Microbiology Results  Results for orders placed or performed during the hospital encounter of 12/30/18  SARS Coronavirus 2 (CEPHEID- Performed in [facility] hospital lab), Hosp Order     Status: None   Collection Time: 12/30/18 10:17 PM   Specimen: Nasopharyngeal Swab  Result Value Ref Range Status   SARS Coronavirus 2 NEGATIVE NEGATIVE Final    Comment: (NOTE) If result is NEGATIVE SARS-CoV-2 target nucleic acids are NOT DETECTED. The SARS-CoV-2 RNA is generally detectable in upper and lower  respiratory specimens during the acute phase of infection. The lowest  concentration of SARS-CoV-2 viral copies this assay can detect is 250  copies / mL. A negative result does not preclude SARS-CoV-2 infection  and should not be used as the sole basis for treatment or other  patient management decisions.  A negative result may occur with  improper specimen collection / handling, submission of specimen other  than nasopharyngeal swab, presence of viral mutation(s) within the  areas targeted by this assay, and inadequate number of viral copies  (<250 copies / mL). A negative result must be combined with clinical  observations, patient history, and epidemiological information. If result is POSITIVE SARS-CoV-2 target nucleic acids are  DETECTED. The SARS-CoV-2 RNA is generally detectable in upper and lower  respiratory specimens dur ing the acute phase of infection.  Positive  results are indicative of active infection with SARS-CoV-2.  Clinical  correlation with patient history and other diagnostic information is  necessary to determine patient infection status.  Positive results do  not rule out bacterial infection or co-infection with other viruses. If result is PRESUMPTIVE POSTIVE SARS-CoV-2 nucleic acids MAY BE PRESENT.   A presumptive positive result was obtained on the submitted specimen  and confirmed on repeat testing.  While 2019 novel coronavirus  (SARS-CoV-2) nucleic acids may be present in the submitted sample  additional confirmatory testing may be necessary for epidemiological  and / or clinical management purposes  to differentiate between  SARS-CoV-2 and other Sarbecovirus currently known to  infect humans.  If clinically indicated additional testing with an alternate test  methodology [phone] is advised. The SARS-CoV-2 RNA is generally  detectable in upper and lower respiratory sp ecimens during the acute  phase of infection. The expected result is Negative. Fact Sheet for Patients:  BoilerBrush.com.cyhttps://www.fda.gov/media/136312/download Fact Sheet for Healthcare Providers: https://pope.com/https://www.fda.gov/media/136313/download This test is not yet approved or cleared by the [place] FDA and has been authorized for detection and/or diagnosis of SARS-CoV-2 by FDA under an Emergency Use Authorization (EUA).  This EUA will remain in effect (meaning this test can be used) for the duration of the COVID-19 declaration under Section 564(b)(1) of the Act, 21 U.S.C. section 360bbb-3(b)(1), unless the authorization is terminated or revoked sooner. Performed at [facility], [address] [place], [place] [address]   Urine Culture     Status: Abnormal   Collection Time: 12/30/18 10:17 PM   Specimen: Urine,  Random  Result Value Ref Range Status   Specimen Description   Final    URINE, RANDOM Performed at [facility], [address] [place], [place] [address]    Special Requests   Final    NONE Performed at [facility], [address] [place], [place] [address]    Culture (A)  Final    <10,000 COLONIES/mL INSIGNIFICANT GROWTH Performed at [facility], [address] [place], [place] [address]    Report Status 01/01/2019 FINAL  Final    RADIOLOGY:  No results found.  Follow up with PCP in 1 week.  Management plans discussed with the patient, family and they are in agreement.  CODE STATUS: Full code    Code Status Orders  (From admission, onward)         Start     Ordered   12/31/18 0145  Full code  Continuous     12/31/18 0144        Code Status History    This patient has a current code status but no historical code status.   Advance Care Planning Activity      TOTAL TIME TAKING CARE OF THIS PATIENT ON DAY OF DISCHARGE: more than 35 minutes.   Ihor AustinPavan Bessy Reaney M.D on 01/02/2019 at 10:54 AM  Between 7am to 6pm - Pager - [phone]  After 6pm go to [email or web address] - password [email or web address]  [facility] [place] [facility]  Office  [phone]  CC: Primary care physician; Patient, No Pcp Per  Note: This dictation was prepared with Dragon dictation along with smaller phrase technology. Any transcriptional errors that result from this process are unintentional.

## 2019-01-02 NOTE — TOC Transition Note (Signed)
Transition of Care [facility]) - CM/SW Discharge Note   Patient Details  Name: Ashley [name] MRN: [id number] Date of Birth: [birth year]  [facility] CM/SW Contact:  Katrina Stack, RN Phone Number: 01/02/2019, 6:35 PM   Clinical Narrative:   Provided patient with application for [facility]. She lives with friends and says that there will be no problem paying for her discharge meds.  Provided Good Rx.  She "thinks" her current residence is in [place].  Provided her the phone number for financial counselor and provided instruction on medicaid and food stamp application          Patient Goals and CMS Choice        Discharge Placement                       Discharge Plan and Services                                     Social Determinants of Health (SDOH) Interventions     Readmission Risk Interventions No flowsheet data found.

## 2019-01-02 NOTE — Progress Notes (Signed)
Dc instructions given.  Pt very anxious to go. Her ride needs to go to work.  Care mgt was able to see pt as she was wheeling out the door. Verbalizes udnerstanding and follow ups.  Dc home

## 2019-01-02 NOTE — Plan of Care (Signed)
Ready for d/c

## 2019-02-25 ENCOUNTER — Emergency Department
Admission: EM | Admit: 2019-02-25 | Discharge: 2019-02-25 | Disposition: A | Discharge status: Home / Self Care | Payer: Self-pay | Attending: Student in an Organized Health Care Education/Training Program | Admitting: Student in an Organized Health Care Education/Training Program

## 2019-02-25 ENCOUNTER — Other Ambulatory Visit: Payer: Self-pay

## 2019-02-25 ENCOUNTER — Encounter: Payer: Self-pay | Admitting: Emergency Medicine

## 2019-02-25 ENCOUNTER — Emergency Department: Payer: Self-pay

## 2019-02-25 DIAGNOSIS — F1910 Other psychoactive substance abuse, uncomplicated: Secondary | ICD-10-CM | POA: Insufficient documentation

## 2019-02-25 DIAGNOSIS — F1110 Opioid abuse, uncomplicated: Secondary | ICD-10-CM | POA: Insufficient documentation

## 2019-02-25 DIAGNOSIS — F17210 Nicotine dependence, cigarettes, uncomplicated: Secondary | ICD-10-CM | POA: Insufficient documentation

## 2019-02-25 DIAGNOSIS — R1011 Right upper quadrant pain: Secondary | ICD-10-CM

## 2019-02-25 DIAGNOSIS — R112 Nausea with vomiting, unspecified: Secondary | ICD-10-CM

## 2019-02-25 LAB — URINALYSIS, COMPLETE (UACMP) WITH MICROSCOPIC
Bacteria, UA: NONE SEEN
Bilirubin Urine: NEGATIVE
Glucose, UA: NEGATIVE mg/dL
Hgb urine dipstick: NEGATIVE
Ketones, ur: NEGATIVE mg/dL
Leukocytes,Ua: NEGATIVE
Nitrite: NEGATIVE
Protein, ur: NEGATIVE mg/dL
Specific Gravity, Urine: 1.008 (ref 1.005–1.030)
pH: 6 (ref 5.0–8.0)

## 2019-02-25 LAB — COMPREHENSIVE METABOLIC PANEL
ALT: 11 U/L (ref 0–44)
AST: 15 U/L (ref 15–41)
Albumin: 4 g/dL (ref 3.5–5.0)
Alkaline Phosphatase: 112 U/L (ref 38–126)
Anion gap: 11 (ref 5–15)
BUN: 16 mg/dL (ref 6–20)
CO2: 22 mmol/L (ref 22–32)
Calcium: 9.2 mg/dL (ref 8.9–10.3)
Chloride: 108 mmol/L (ref 98–111)
Creatinine, Ser: 0.48 mg/dL (ref 0.44–1.00)
GFR calc Af Amer: >60 mL/min (ref >60)
GFR calc non Af Amer: >60 mL/min (ref >60)
Glucose, Bld: 116 mg/dL — ABNORMAL HIGH (ref 70–99)
Potassium: 3 mmol/L — ABNORMAL LOW (ref 3.5–5.1)
Sodium: 141 mmol/L (ref 135–145)
Total Bilirubin: 0.4 mg/dL (ref 0.3–1.2)
Total Protein: 7.4 g/dL (ref 6.5–8.1)

## 2019-02-25 LAB — LIPASE, BLOOD: Lipase: 33 U/L (ref 11–51)

## 2019-02-25 LAB — CBC
HCT: 38.6 % (ref 36.0–46.0)
Hemoglobin: 12.2 g/dL (ref 12.0–15.0)
MCH: 27.9 pg (ref 26.0–34.0)
MCHC: 31.6 g/dL (ref 30.0–36.0)
MCV: 88.3 fL (ref 80.0–100.0)
Platelets: 337 10*3/uL (ref 150–400)
RBC: 4.37 MIL/uL (ref 3.87–5.11)
RDW: 14.1 % (ref 11.5–15.5)
WBC: 9.7 10*3/uL (ref 4.0–10.5)
nRBC: 0 % (ref 0.0–0.2)

## 2019-02-25 LAB — URINE DRUG SCREEN, QUALITATIVE (ARMC ONLY)
Amphetamines, Ur Screen: NOT DETECTED
Barbiturates, Ur Screen: NOT DETECTED
Benzodiazepine, Ur Scrn: NOT DETECTED
Cannabinoid 50 Ng, Ur ~~LOC~~: NOT DETECTED
Cocaine Metabolite,Ur ~~LOC~~: NOT DETECTED
MDMA (Ecstasy)Ur Screen: NOT DETECTED
Methadone Scn, Ur: NOT DETECTED
Opiate, Ur Screen: NOT DETECTED
Phencyclidine (PCP) Ur S: NOT DETECTED
Tricyclic, Ur Screen: NOT DETECTED

## 2019-02-25 MED ORDER — SODIUM CHLORIDE 0.9 % IV BOLUS
500.0000 mL | Freq: Once | INTRAVENOUS | Status: AC
  Administered 2019-02-25: 12:00:00 500 mL via INTRAVENOUS

## 2019-02-25 MED ORDER — SODIUM CHLORIDE 0.9 % IV BOLUS
1000.0000 mL | Freq: Once | INTRAVENOUS | Status: AC
  Administered 2019-02-25: 1000 mL via INTRAVENOUS

## 2019-02-25 MED ORDER — PROMETHAZINE HCL 12.5 MG PO TABS: 12.5000 mg | ORAL_TABLET | Freq: Four times a day (QID) | ORAL | 0 refills | Status: DC | PRN

## 2019-02-25 MED ORDER — DICYCLOMINE HCL 10 MG PO CAPS: 10.0000 mg | ORAL_CAPSULE | Freq: Four times a day (QID) | ORAL | 0 refills | Status: DC | PRN

## 2019-02-25 MED ORDER — MORPHINE SULFATE (PF) 4 MG/ML IV SOLN
4.0000 mg | INTRAVENOUS | Status: DC | PRN
  Administered 2019-02-25: 4 mg via INTRAVENOUS
  Filled 2019-02-25: qty 1

## 2019-02-25 MED ORDER — PROMETHAZINE HCL 25 MG/ML IJ SOLN
25.0000 mg | Freq: Four times a day (QID) | INTRAMUSCULAR | Status: DC | PRN
  Administered 2019-02-25: 25 mg via INTRAVENOUS
  Filled 2019-02-25: qty 1

## 2019-02-25 MED ORDER — HALOPERIDOL LACTATE 5 MG/ML IJ SOLN
5.0000 mg | Freq: Once | INTRAMUSCULAR | Status: AC
  Administered 2019-02-25: 12:00:00 5 mg via INTRAVENOUS
  Filled 2019-02-25: qty 1

## 2019-02-25 NOTE — ED Provider Notes (Signed)
[facility] Emergency Department Provider Note    First MD Initiated Contact with Patient 02/25/19 1000     (approximate)  I have reviewed the triage vital signs and the nursing notes.   HISTORY  Chief Complaint Abdominal Pain    HPI Ashley [name] is a 53 y.o. female with a history of GERD previous admission to hospital for intractable nausea vomiting presents the ER for recurrent right upper quadrant abdominal pain radiating to her left shoulder with nausea and vomiting for the past 24 hours.  No fevers.  States the pain is moderate to severe.  States is similar to previous presentation.    Past Medical History:  Diagnosis Date   GERD (gastroesophageal reflux disease)    No family history on file. Past Surgical History:  Procedure Laterality Date   TUBAL LIGATION     Patient Active Problem List   Diagnosis Date Noted   Acute gastroenteritis 01/01/2019   Intractable nausea and vomiting 12/31/2018   AKI (acute kidney injury) ([facility]) 12/31/2018   GERD (gastroesophageal reflux disease) 12/31/2018      Prior to Admission medications   Medication Sig Start Date End Date Taking? Authorizing Provider  dicyclomine (BENTYL) 10 MG capsule Take 1 capsule (10 mg total) by mouth every 6 (six) hours as needed for spasms. 02/25/19   Willy Eddy, MD  HYDROcodone-acetaminophen (NORCO/VICODIN) 5-325 MG tablet Take 1 tablet by mouth every 4 (four) hours as needed for severe pain. 01/02/19   Pyreddy, Vivien Rota, MD  nicotine (NICODERM CQ - DOSED IN MG/24 HOURS) 14 mg/24hr patch Place 1 patch (14 mg total) onto the skin daily. 01/03/19   Ihor Austin, MD  ondansetron (ZOFRAN) 4 MG tablet Take 1 tablet (4 mg total) by mouth every 6 (six) hours as needed for nausea. 01/02/19   Ihor Austin, MD  pantoprazole (PROTONIX) 40 MG tablet Take 1 tablet (40 mg total) by mouth daily. 01/03/19 02/02/19  Ihor Austin, MD  promethazine (PHENERGAN) 12.5 MG tablet Take 1 tablet  (12.5 mg total) by mouth every 6 (six) hours as needed. 02/25/19   Willy Eddy, MD    Allergies Patient has no known allergies.    Social History Social History   Tobacco Use   Smoking status: Current Every Day Smoker    Types: Cigarettes   Smokeless tobacco: Never Used  Substance Use Topics   Alcohol use: No   Drug use: No    Review of Systems Patient denies headaches, rhinorrhea, blurry vision, numbness, shortness of breath, chest pain, edema, cough, abdominal pain, nausea, vomiting, diarrhea, dysuria, fevers, rashes or hallucinations unless otherwise stated above in HPI. ____________________________________________   PHYSICAL EXAM:  VITAL SIGNS: Vitals:   02/25/19 1230 02/25/19 1300  BP: (!) 162/76 (!) 150/77  Pulse: 64 75  Resp: 19 20  Temp:    SpO2: 100% 100%    Constitutional: Alert and oriented.  Eyes: Conjunctivae are normal.  Head: Atraumatic. Nose: No congestion/rhinnorhea. Mouth/Throat: Mucous membranes are moist.   Neck: No stridor. Painless ROM.  Cardiovascular: Normal rate, regular rhythm. Grossly normal heart sounds.  Good peripheral circulation. Respiratory: Normal respiratory effort.  No retractions. Lungs CTAB. Gastrointestinal: Soft with mild ttp in ruq No distention. No abdominal bruits. No CVA tenderness. Genitourinary:  Musculoskeletal: No lower extremity tenderness nor edema.  No joint effusions. Neurologic:  Normal speech and language. No gross focal neurologic deficits are appreciated. No facial droop Skin:  Skin is warm, dry and intact. No rash noted. Psychiatric: Mood  and affect are normal. Speech and behavior are normal.  ____________________________________________   LABS (all labs ordered are listed, but only abnormal results are displayed)  Results for orders placed or performed during the hospital encounter of 02/25/19 (from the past 24 hour(s))  Lipase, blood     Status: None   Collection Time: 02/25/19  8:15 AM    Result Value Ref Range   Lipase 33 11 - 51 U/L  Comprehensive metabolic panel     Status: Abnormal   Collection Time: 02/25/19  8:15 AM  Result Value Ref Range   Sodium 141 135 - 145 mmol/L   Potassium 3.0 (L) 3.5 - 5.1 mmol/L   Chloride 108 98 - 111 mmol/L   CO2 22 22 - 32 mmol/L   Glucose, Bld 116 (H) 70 - 99 mg/dL   BUN 16 6 - 20 mg/dL   Creatinine, Ser 4.090.48 0.44 - 1.00 mg/dL   Calcium 9.2 8.9 - 81.110.3 mg/dL   Total Protein 7.4 6.5 - 8.1 g/dL   Albumin 4.0 3.5 - 5.0 g/dL   AST 15 15 - 41 U/L   ALT 11 0 - 44 U/L   Alkaline Phosphatase 112 38 - 126 U/L   Total Bilirubin 0.4 0.3 - 1.2 mg/dL   GFR calc non Af Amer >60 >60 mL/min   GFR calc Af Amer >60 >60 mL/min   Anion gap 11 5 - 15  CBC     Status: None   Collection Time: 02/25/19  8:15 AM  Result Value Ref Range   WBC 9.7 4.0 - 10.5 K/uL   RBC 4.37 3.87 - 5.11 MIL/uL   Hemoglobin 12.2 12.0 - 15.0 g/dL   HCT 91.438.6 78.236.0 - 95.646.0 %   MCV 88.3 80.0 - 100.0 fL   MCH 27.9 26.0 - 34.0 pg   MCHC 31.6 30.0 - 36.0 g/dL   RDW 21.314.1 08.611.5 - 57.815.5 %   Platelets 337 150 - 400 K/uL   nRBC 0.0 0.0 - 0.2 %  Urinalysis, Complete w Microscopic     Status: Abnormal   Collection Time: 02/25/19  8:15 AM  Result Value Ref Range   Color, Urine STRAW (A) YELLOW   APPearance CLEAR (A) CLEAR   Specific Gravity, Urine 1.008 1.005 - 1.030   pH 6.0 5.0 - 8.0   Glucose, UA NEGATIVE NEGATIVE mg/dL   Hgb urine dipstick NEGATIVE NEGATIVE   Bilirubin Urine NEGATIVE NEGATIVE   Ketones, ur NEGATIVE NEGATIVE mg/dL   Protein, ur NEGATIVE NEGATIVE mg/dL   Nitrite NEGATIVE NEGATIVE   Leukocytes,Ua NEGATIVE NEGATIVE   RBC / HPF 0-5 0 - 5 RBC/hpf   WBC, UA 0-5 0 - 5 WBC/hpf   Bacteria, UA NONE SEEN NONE SEEN   Squamous Epithelial / LPF 0-5 0 - 5  Urine Drug Screen, Qualitative ([facility] only)     Status: None   Collection Time: 02/25/19  8:15 AM  Result Value Ref Range   Tricyclic, Ur Screen NONE DETECTED NONE DETECTED   Amphetamines, Ur Screen NONE DETECTED  NONE DETECTED   MDMA (Ecstasy)Ur Screen NONE DETECTED NONE DETECTED   Cocaine Metabolite,Ur [place] NONE DETECTED NONE DETECTED   Opiate, Ur Screen NONE DETECTED NONE DETECTED   Phencyclidine (PCP) Ur S NONE DETECTED NONE DETECTED   Cannabinoid 50 Ng, Ur [place] NONE DETECTED NONE DETECTED   Barbiturates, Ur Screen NONE DETECTED NONE DETECTED   Benzodiazepine, Ur Scrn NONE DETECTED NONE DETECTED   Methadone Scn, Ur NONE DETECTED NONE DETECTED  ____________________________________________  EKG My review and personal interpretation at Time: 8:00   Indication: n/v  Rate: 85  Rhythm: sinus Axis: normal Other: normal intervals, no stemi ____________________________________________  RADIOLOGY  I personally reviewed all radiographic images ordered to evaluate for the above acute complaints and reviewed radiology reports and findings.  These findings were personally discussed with the patient.  Please see medical record for radiology report.  ____________________________________________   PROCEDURES  Procedure(s) performed:  Procedures    Critical Care performed: no ____________________________________________   INITIAL IMPRESSION / ASSESSMENT AND PLAN / ED COURSE  Pertinent labs & imaging results that were available during my care of the patient were reviewed by me and considered in my medical decision making (see chart for details).   DDX: IBD, enteritis, cholelithiasis, cholecystitis, pancreatitis, gastritis  Ashley [name] is a 53 y.o. who presents to the ED with symptoms as described above.  Patient afebrile hemodynamically stable.  Appears well perfused.  Her abdominal exam at this time is benign but she is complaining of right upper quadrant pain will order right upper quadrant ultrasound to further evaluate.  Do not feel that CT imaging is clinically indicated.  Will provide pain medication as well as antiemetics and reassess.  Clinical Course as of Feb 25 1339  Wed Feb 25, 2019    1326 Patient feels significantly improved.  She is tolerating p.o.  Does not want to wait for stool studies.  She is requesting discharge home prescription for antispasmodics.  Have discussed with the patient and available family all diagnostics and treatments performed thus far and all questions were answered to the best of my ability. The patient demonstrates understanding and agreement with plan.    [PR]    Clinical Course User Index [PR] Merlyn Lot, MD    The patient was evaluated in Emergency Department today for the symptoms described in the history of present illness. He/she was evaluated in the context of the global COVID-19 pandemic, which necessitated consideration that the patient might be at risk for infection with the SARS-CoV-2 virus that causes COVID-19. Institutional protocols and algorithms that pertain to the evaluation of patients at risk for COVID-19 are in a state of rapid change based on information released by regulatory bodies including the CDC and federal and state organizations. These policies and algorithms were followed during the patient's care in the ED.  As part of my medical decision making, I reviewed the following data within the [facility] notes reviewed and incorporated, Labs reviewed, notes from prior ED visits and [place] Controlled Substance Database   ____________________________________________   FINAL CLINICAL IMPRESSION(S) / ED DIAGNOSES  Final diagnoses:  RUQ pain  Nausea and vomiting, intractability of vomiting not specified, unspecified vomiting type      NEW MEDICATIONS STARTED DURING THIS VISIT:  New Prescriptions   PROMETHAZINE (PHENERGAN) 12.5 MG TABLET    Take 1 tablet (12.5 mg total) by mouth every 6 (six) hours as needed.     Note:  This document was prepared using Dragon voice recognition software and may include unintentional dictation errors.    Merlyn Lot, MD 02/25/19 1340

## 2019-02-25 NOTE — ED Notes (Signed)
Pt resting when RN entered room. States she is feeling better and believes she can tolerate PO intake. Pt given ice water, graham crackers, saltines, and peanut butter at this time. Instructed to drink and eat slowly due to previous nausea.

## 2019-02-25 NOTE — Discharge Instructions (Addendum)

## 2019-02-25 NOTE — ED Triage Notes (Signed)
Patient presents to the ED with RUQ abdominal pain x 2 days. Patient also reports N/V x 2 days.  Patient presents to the ED via EMS.  Patient reports history of similar pain in the past.  EMS started an 18g IV and gave patient 4mg  of zofran.

## 2019-03-15 ENCOUNTER — Inpatient Hospital Stay (HOSPITAL_COMMUNITY): Payer: Self-pay

## 2019-03-15 ENCOUNTER — Encounter (HOSPITAL_COMMUNITY)
Admission: EM | Disposition: A | Discharge status: Home / Self Care | Payer: Self-pay | Source: Home / Self Care | Attending: Internal Medicine

## 2019-03-15 ENCOUNTER — Inpatient Hospital Stay (HOSPITAL_COMMUNITY): Payer: Self-pay | Admitting: Anesthesiology

## 2019-03-15 ENCOUNTER — Other Ambulatory Visit: Payer: Self-pay

## 2019-03-15 ENCOUNTER — Encounter (HOSPITAL_COMMUNITY): Payer: Self-pay | Admitting: Emergency Medicine

## 2019-03-15 ENCOUNTER — Inpatient Hospital Stay (HOSPITAL_COMMUNITY)
Admission: EM | Admit: 2019-03-15 | Discharge: 2019-03-21 | DRG: 356 | Disposition: A | Discharge status: Home / Self Care | Payer: Self-pay | Attending: Internal Medicine | Admitting: Internal Medicine

## 2019-03-15 DIAGNOSIS — K210 Gastro-esophageal reflux disease with esophagitis: Secondary | ICD-10-CM | POA: Diagnosis present

## 2019-03-15 DIAGNOSIS — Z79899 Other long term (current) drug therapy: Secondary | ICD-10-CM

## 2019-03-15 DIAGNOSIS — I959 Hypotension, unspecified: Secondary | ICD-10-CM | POA: Diagnosis present

## 2019-03-15 DIAGNOSIS — K264 Chronic or unspecified duodenal ulcer with hemorrhage: Secondary | ICD-10-CM

## 2019-03-15 DIAGNOSIS — K254 Chronic or unspecified gastric ulcer with hemorrhage: Principal | ICD-10-CM | POA: Diagnosis present

## 2019-03-15 DIAGNOSIS — E875 Hyperkalemia: Secondary | ICD-10-CM | POA: Diagnosis present

## 2019-03-15 DIAGNOSIS — K922 Gastrointestinal hemorrhage, unspecified: Secondary | ICD-10-CM

## 2019-03-15 DIAGNOSIS — R109 Unspecified abdominal pain: Secondary | ICD-10-CM

## 2019-03-15 DIAGNOSIS — R0689 Other abnormalities of breathing: Secondary | ICD-10-CM

## 2019-03-15 DIAGNOSIS — Z800 Family history of malignant neoplasm of digestive organs: Secondary | ICD-10-CM

## 2019-03-15 DIAGNOSIS — D62 Acute posthemorrhagic anemia: Secondary | ICD-10-CM | POA: Diagnosis present

## 2019-03-15 DIAGNOSIS — Z978 Presence of other specified devices: Secondary | ICD-10-CM

## 2019-03-15 DIAGNOSIS — K259 Gastric ulcer, unspecified as acute or chronic, without hemorrhage or perforation: Secondary | ICD-10-CM | POA: Diagnosis present

## 2019-03-15 DIAGNOSIS — Z8543 Personal history of malignant neoplasm of ovary: Secondary | ICD-10-CM

## 2019-03-15 DIAGNOSIS — R634 Abnormal weight loss: Secondary | ICD-10-CM | POA: Diagnosis present

## 2019-03-15 DIAGNOSIS — F17210 Nicotine dependence, cigarettes, uncomplicated: Secondary | ICD-10-CM | POA: Diagnosis present

## 2019-03-15 DIAGNOSIS — R571 Hypovolemic shock: Secondary | ICD-10-CM | POA: Diagnosis present

## 2019-03-15 DIAGNOSIS — K589 Irritable bowel syndrome without diarrhea: Secondary | ICD-10-CM | POA: Diagnosis present

## 2019-03-15 DIAGNOSIS — R630 Anorexia: Secondary | ICD-10-CM | POA: Diagnosis present

## 2019-03-15 DIAGNOSIS — Z79891 Long term (current) use of opiate analgesic: Secondary | ICD-10-CM

## 2019-03-15 DIAGNOSIS — D649 Anemia, unspecified: Secondary | ICD-10-CM | POA: Diagnosis present

## 2019-03-15 DIAGNOSIS — E872 Acidosis: Secondary | ICD-10-CM | POA: Diagnosis present

## 2019-03-15 DIAGNOSIS — K449 Diaphragmatic hernia without obstruction or gangrene: Secondary | ICD-10-CM | POA: Diagnosis present

## 2019-03-15 DIAGNOSIS — R112 Nausea with vomiting, unspecified: Secondary | ICD-10-CM | POA: Diagnosis present

## 2019-03-15 DIAGNOSIS — Z20828 Contact with and (suspected) exposure to other viral communicable diseases: Secondary | ICD-10-CM | POA: Diagnosis present

## 2019-03-15 DIAGNOSIS — E876 Hypokalemia: Secondary | ICD-10-CM

## 2019-03-15 DIAGNOSIS — Z8041 Family history of malignant neoplasm of ovary: Secondary | ICD-10-CM

## 2019-03-15 HISTORY — PX: IR EMBO ART  VEN HEMORR LYMPH EXTRAV  INC GUIDE ROADMAPPING: IMG5450

## 2019-03-15 HISTORY — PX: IR US GUIDE VASC ACCESS RIGHT: IMG2390

## 2019-03-15 HISTORY — PX: IR ANGIOGRAM VISCERAL SELECTIVE: IMG657

## 2019-03-15 HISTORY — PX: ESOPHAGOGASTRODUODENOSCOPY (EGD) WITH PROPOFOL: SHX5813

## 2019-03-15 HISTORY — PX: IR ANGIOGRAM SELECTIVE EACH ADDITIONAL VESSEL: IMG667

## 2019-03-15 LAB — GLUCOSE, CAPILLARY
Glucose-Capillary: 121 mg/dL — ABNORMAL HIGH (ref 70–99)
Glucose-Capillary: 123 mg/dL — ABNORMAL HIGH (ref 70–99)
Glucose-Capillary: 158 mg/dL — ABNORMAL HIGH (ref 70–99)
Glucose-Capillary: 167 mg/dL — ABNORMAL HIGH (ref 70–99)

## 2019-03-15 LAB — BLOOD GAS, ARTERIAL
Acid-base deficit: 8.7 mmol/L — ABNORMAL HIGH (ref 0.0–2.0)
Bicarbonate: 15.6 mmol/L — ABNORMAL LOW (ref 20.0–28.0)
Drawn by: 331471
FIO2: 100
MECHVT: 430 mL
O2 Saturation: 99.6 %
PEEP: 5 cmH2O
Patient temperature: 98.6
RATE: 14 resp/min
pCO2 arterial: 28.8 mmHg — ABNORMAL LOW (ref 32.0–48.0)
pH, Arterial: 7.354 (ref 7.350–7.450)
pO2, Arterial: 368 mmHg — ABNORMAL HIGH (ref 83.0–108.0)

## 2019-03-15 LAB — CBC
HCT: 32.1 % — ABNORMAL LOW (ref 36.0–46.0)
Hemoglobin: 10.6 g/dL — ABNORMAL LOW (ref 12.0–15.0)
MCH: 28 pg (ref 26.0–34.0)
MCHC: 33 g/dL (ref 30.0–36.0)
MCV: 84.7 fL (ref 80.0–100.0)
Platelets: 501 10*3/uL — ABNORMAL HIGH (ref 150–400)
RBC: 3.79 MIL/uL — ABNORMAL LOW (ref 3.87–5.11)
RDW: 14.6 % (ref 11.5–15.5)
WBC: 24.1 10*3/uL — ABNORMAL HIGH (ref 4.0–10.5)
nRBC: 0 % (ref 0.0–0.2)

## 2019-03-15 LAB — CBC WITH DIFFERENTIAL/PLATELET
Abs Immature Granulocytes: 0.13 10*3/uL — ABNORMAL HIGH (ref 0.00–0.07)
Basophils Absolute: 0 10*3/uL (ref 0.0–0.1)
Basophils Relative: 0 %
Eosinophils Absolute: 0 10*3/uL (ref 0.0–0.5)
Eosinophils Relative: 0 %
HCT: 48.3 % — ABNORMAL HIGH (ref 36.0–46.0)
Hemoglobin: 16.3 g/dL — ABNORMAL HIGH (ref 12.0–15.0)
Immature Granulocytes: 1 %
Lymphocytes Relative: 10 %
Lymphs Abs: 1.2 10*3/uL (ref 0.7–4.0)
MCH: 29.9 pg (ref 26.0–34.0)
MCHC: 33.7 g/dL (ref 30.0–36.0)
MCV: 88.6 fL (ref 80.0–100.0)
Monocytes Absolute: 0.5 10*3/uL (ref 0.1–1.0)
Monocytes Relative: 4 %
Neutro Abs: 10.6 10*3/uL — ABNORMAL HIGH (ref 1.7–7.7)
Neutrophils Relative %: 85 %
Platelets: 103 10*3/uL — ABNORMAL LOW (ref 150–400)
RBC: 5.45 MIL/uL — ABNORMAL HIGH (ref 3.87–5.11)
RDW: 14.6 % (ref 11.5–15.5)
WBC: 12.5 10*3/uL — ABNORMAL HIGH (ref 4.0–10.5)
nRBC: 0 % (ref 0.0–0.2)

## 2019-03-15 LAB — COMPREHENSIVE METABOLIC PANEL
ALT: 13 U/L (ref 0–44)
AST: 22 U/L (ref 15–41)
Albumin: 3.5 g/dL (ref 3.5–5.0)
Alkaline Phosphatase: 128 U/L — ABNORMAL HIGH (ref 38–126)
Anion gap: 14 (ref 5–15)
BUN: 15 mg/dL (ref 6–20)
CO2: 17 mmol/L — ABNORMAL LOW (ref 22–32)
Calcium: 8.3 mg/dL — ABNORMAL LOW (ref 8.9–10.3)
Chloride: 103 mmol/L (ref 98–111)
Creatinine, Ser: 0.84 mg/dL (ref 0.44–1.00)
GFR calc Af Amer: >60 mL/min (ref >60)
GFR calc non Af Amer: >60 mL/min (ref >60)
Glucose, Bld: 131 mg/dL — ABNORMAL HIGH (ref 70–99)
Potassium: 2.4 mmol/L — CL (ref 3.5–5.1)
Sodium: 134 mmol/L — ABNORMAL LOW (ref 135–145)
Total Bilirubin: 0.4 mg/dL (ref 0.3–1.2)
Total Protein: 6.8 g/dL (ref 6.5–8.1)

## 2019-03-15 LAB — BASIC METABOLIC PANEL
Anion gap: 7 (ref 5–15)
BUN: 12 mg/dL (ref 6–20)
CO2: 18 mmol/L — ABNORMAL LOW (ref 22–32)
Calcium: 7.1 mg/dL — ABNORMAL LOW (ref 8.9–10.3)
Chloride: 118 mmol/L — ABNORMAL HIGH (ref 98–111)
Creatinine, Ser: 0.64 mg/dL (ref 0.44–1.00)
GFR calc Af Amer: >60 mL/min (ref >60)
GFR calc non Af Amer: >60 mL/min (ref >60)
Glucose, Bld: 145 mg/dL — ABNORMAL HIGH (ref 70–99)
Potassium: 3.3 mmol/L — ABNORMAL LOW (ref 3.5–5.1)
Sodium: 143 mmol/L (ref 135–145)

## 2019-03-15 LAB — POCT I-STAT 4, (NA,K, GLUC, HGB,HCT)
Glucose, Bld: 85 mg/dL (ref 70–99)
Glucose, Bld: 144 mg/dL — ABNORMAL HIGH (ref 70–99)
HCT: 26 % — ABNORMAL LOW (ref 36.0–46.0)
HCT: 21 % — ABNORMAL LOW (ref 36.0–46.0)
Hemoglobin: 8.8 g/dL — ABNORMAL LOW (ref 12.0–15.0)
Hemoglobin: 7.1 g/dL — ABNORMAL LOW (ref 12.0–15.0)
Potassium: 2.6 mmol/L — CL (ref 3.5–5.1)
Potassium: 2.9 mmol/L — ABNORMAL LOW (ref 3.5–5.1)
Sodium: 140 mmol/L (ref 135–145)
Sodium: 139 mmol/L (ref 135–145)

## 2019-03-15 LAB — PREPARE RBC (CROSSMATCH)

## 2019-03-15 LAB — HCG, SERUM, QUALITATIVE: Preg, Serum: NEGATIVE

## 2019-03-15 LAB — SARS CORONAVIRUS 2 BY RT PCR (HOSPITAL ORDER, PERFORMED IN ~~LOC~~ HOSPITAL LAB): SARS Coronavirus 2: NEGATIVE

## 2019-03-15 LAB — MRSA PCR SCREENING: MRSA by PCR: NEGATIVE

## 2019-03-15 LAB — MAGNESIUM: Magnesium: 2.1 mg/dL (ref 1.7–2.4)

## 2019-03-15 LAB — POC OCCULT BLOOD, ED: Fecal Occult Bld: POSITIVE — AB

## 2019-03-15 LAB — PROTIME-INR
INR: 1.1 (ref 0.8–1.2)
Prothrombin Time: 14.3 seconds (ref 11.4–15.2)

## 2019-03-15 LAB — ABO/RH: ABO/RH(D): A POS

## 2019-03-15 LAB — TRIGLYCERIDES: Triglycerides: 127 mg/dL (ref <150)

## 2019-03-15 SURGERY — ESOPHAGOGASTRODUODENOSCOPY (EGD) WITH PROPOFOL
Anesthesia: General

## 2019-03-15 MED ORDER — PROPOFOL 1000 MG/100ML IV EMUL
5.0000 ug/kg/min | INTRAVENOUS | Status: DC
  Administered 2019-03-15: 5 ug/kg/min via INTRAVENOUS
  Administered 2019-03-15: 11:00:00 via INTRAVENOUS
  Administered 2019-03-15: 19:00:00 50 ug/kg/min via INTRAVENOUS
  Administered 2019-03-16: 40 ug/kg/min via INTRAVENOUS
  Filled 2019-03-15 (×3): qty 100

## 2019-03-15 MED ORDER — SODIUM CHLORIDE 0.9% IV SOLUTION
Freq: Once | INTRAVENOUS | Status: AC
  Administered 2019-03-15: 17:00:00 via INTRAVENOUS

## 2019-03-15 MED ORDER — FENTANYL CITRATE (PF) 100 MCG/2ML IJ SOLN: 25.0000 ug | Freq: Once | INTRAMUSCULAR | Status: DC

## 2019-03-15 MED ORDER — PROPOFOL 500 MG/50ML IV EMUL
INTRAVENOUS | Status: DC | PRN
  Administered 2019-03-15: 150 ug/kg/min via INTRAVENOUS

## 2019-03-15 MED ORDER — FENTANYL BOLUS VIA INFUSION
50.0000 ug | INTRAVENOUS | Status: DC | PRN
  Administered 2019-03-15 – 2019-03-16 (×4): 50 ug via INTRAVENOUS
  Filled 2019-03-15: qty 50

## 2019-03-15 MED ORDER — SODIUM CHLORIDE 0.9 % IV SOLN
80.0000 mg | Freq: Once | INTRAVENOUS | Status: AC
  Administered 2019-03-15: 80 mg via INTRAVENOUS
  Filled 2019-03-15: qty 80

## 2019-03-15 MED ORDER — CHLORHEXIDINE GLUCONATE CLOTH 2 % EX PADS
6.0000 | MEDICATED_PAD | Freq: Every day | CUTANEOUS | Status: DC
  Administered 2019-03-15 – 2019-03-21 (×7): 6 via TOPICAL

## 2019-03-15 MED ORDER — SUCCINYLCHOLINE CHLORIDE 200 MG/10ML IV SOSY
PREFILLED_SYRINGE | INTRAVENOUS | Status: DC | PRN
  Administered 2019-03-15: 140 mg via INTRAVENOUS

## 2019-03-15 MED ORDER — PROPOFOL 10 MG/ML IV BOLUS
INTRAVENOUS | Status: DC | PRN
  Administered 2019-03-15: 200 mg via INTRAVENOUS

## 2019-03-15 MED ORDER — LIDOCAINE HCL 1 % IJ SOLN
INTRAMUSCULAR | Status: AC
  Filled 2019-03-15: qty 20

## 2019-03-15 MED ORDER — HYDROMORPHONE HCL 1 MG/ML IJ SOLN
1.0000 mg | Freq: Once | INTRAMUSCULAR | Status: DC
  Filled 2019-03-15: qty 1

## 2019-03-15 MED ORDER — CHLORHEXIDINE GLUCONATE 0.12% ORAL RINSE (MEDLINE KIT)
15.0000 mL | Freq: Two times a day (BID) | OROMUCOSAL | Status: DC
  Administered 2019-03-15 – 2019-03-21 (×11): 15 mL via OROMUCOSAL

## 2019-03-15 MED ORDER — LIDOCAINE 2% (20 MG/ML) 5 ML SYRINGE
INTRAMUSCULAR | Status: DC | PRN
  Administered 2019-03-15: 50 mg via INTRAVENOUS

## 2019-03-15 MED ORDER — ONDANSETRON HCL 4 MG/2ML IJ SOLN
INTRAMUSCULAR | Status: DC | PRN
  Administered 2019-03-15: 4 mg via INTRAVENOUS

## 2019-03-15 MED ORDER — DICYCLOMINE HCL 10 MG PO CAPS
10.0000 mg | ORAL_CAPSULE | Freq: Four times a day (QID) | ORAL | Status: DC | PRN
  Filled 2019-03-15: qty 1

## 2019-03-15 MED ORDER — NOREPINEPHRINE 4 MG/250ML-% IV SOLN
2.0000 ug/min | INTRAVENOUS | Status: DC
  Administered 2019-03-15 (×3): 2 ug/min via INTRAVENOUS
  Filled 2019-03-15 (×2): qty 250

## 2019-03-15 MED ORDER — ORAL CARE MOUTH RINSE
15.0000 mL | OROMUCOSAL | Status: DC
  Administered 2019-03-15 – 2019-03-17 (×20): 15 mL via OROMUCOSAL

## 2019-03-15 MED ORDER — SODIUM CHLORIDE 0.9% IV SOLUTION: Freq: Once | INTRAVENOUS | Status: AC

## 2019-03-15 MED ORDER — ALBUMIN HUMAN 5 % IV SOLN
INTRAVENOUS | Status: AC
  Filled 2019-03-15: qty 250

## 2019-03-15 MED ORDER — SODIUM CHLORIDE 0.9 % IV SOLN: 250.0000 mL | INTRAVENOUS | Status: DC

## 2019-03-15 MED ORDER — ALBUMIN HUMAN 5 % IV SOLN
INTRAVENOUS | Status: DC | PRN
  Administered 2019-03-15: 10:00:00 via INTRAVENOUS

## 2019-03-15 MED ORDER — LORAZEPAM 2 MG/ML IJ SOLN
INTRAMUSCULAR | Status: AC
  Administered 2019-03-15: 1 mg
  Filled 2019-03-15: qty 1

## 2019-03-15 MED ORDER — PROPOFOL 1000 MG/100ML IV EMUL
INTRAVENOUS | Status: AC
  Administered 2019-03-15: 11:00:00 via INTRAVENOUS
  Filled 2019-03-15: qty 100

## 2019-03-15 MED ORDER — FENTANYL 2500MCG IN NS 250ML (10MCG/ML) PREMIX INFUSION
50.0000 ug/h | INTRAVENOUS | Status: DC
  Administered 2019-03-15: 15:00:00 50 ug/h via INTRAVENOUS
  Administered 2019-03-16: 200 ug/h via INTRAVENOUS
  Filled 2019-03-15 (×2): qty 250

## 2019-03-15 MED ORDER — INSULIN ASPART 100 UNIT/ML ~~LOC~~ SOLN
0.0000 [IU] | SUBCUTANEOUS | Status: DC
  Administered 2019-03-15: 20:00:00 1 [IU] via SUBCUTANEOUS
  Administered 2019-03-15: 2 [IU] via SUBCUTANEOUS
  Administered 2019-03-16 (×2): 1 [IU] via SUBCUTANEOUS
  Administered 2019-03-16: 2 [IU] via SUBCUTANEOUS
  Administered 2019-03-18: 1 [IU] via SUBCUTANEOUS

## 2019-03-15 MED ORDER — ROCURONIUM BROMIDE 100 MG/10ML IV SOLN
INTRAVENOUS | Status: DC | PRN
  Administered 2019-03-15: 40 mg via INTRAVENOUS

## 2019-03-15 MED ORDER — POTASSIUM CHLORIDE 10 MEQ/100ML IV SOLN
10.0000 meq | INTRAVENOUS | Status: DC
  Administered 2019-03-15: 10 meq via INTRAVENOUS
  Filled 2019-03-15: qty 100

## 2019-03-15 MED ORDER — IOHEXOL 300 MG/ML  SOLN
100.0000 mL | Freq: Once | INTRAMUSCULAR | Status: AC | PRN
  Administered 2019-03-15: 20 mL via INTRA_ARTERIAL

## 2019-03-15 MED ORDER — FENTANYL CITRATE (PF) 100 MCG/2ML IJ SOLN
50.0000 ug | Freq: Once | INTRAMUSCULAR | Status: AC
  Administered 2019-03-15: 50 ug via INTRAVENOUS
  Filled 2019-03-15: qty 2

## 2019-03-15 MED ORDER — SODIUM CHLORIDE 0.9% IV SOLUTION
Freq: Once | INTRAVENOUS | Status: AC
  Administered 2019-03-15: 15:00:00 via INTRAVENOUS

## 2019-03-15 MED ORDER — LACTATED RINGERS IV SOLN
INTRAVENOUS | Status: DC
  Administered 2019-03-15: 09:00:00 via INTRAVENOUS

## 2019-03-15 MED ORDER — FENTANYL CITRATE (PF) 250 MCG/5ML IJ SOLN
INTRAMUSCULAR | Status: DC | PRN
  Administered 2019-03-15 (×2): 50 ug via INTRAVENOUS

## 2019-03-15 MED ORDER — NICOTINE 14 MG/24HR TD PT24
14.0000 mg | MEDICATED_PATCH | Freq: Every day | TRANSDERMAL | Status: DC
  Administered 2019-03-15 – 2019-03-21 (×7): 14 mg via TRANSDERMAL
  Filled 2019-03-15 (×7): qty 1

## 2019-03-15 MED ORDER — POTASSIUM CHLORIDE 10 MEQ/100ML IV SOLN
10.0000 meq | INTRAVENOUS | Status: AC
  Administered 2019-03-15 – 2019-03-16 (×4): 10 meq via INTRAVENOUS
  Filled 2019-03-15: qty 100

## 2019-03-15 MED ORDER — FENTANYL CITRATE (PF) 100 MCG/2ML IJ SOLN
INTRAMUSCULAR | Status: AC
  Filled 2019-03-15: qty 2

## 2019-03-15 MED ORDER — SODIUM CHLORIDE 0.9 % IV BOLUS
500.0000 mL | Freq: Once | INTRAVENOUS | Status: AC
  Administered 2019-03-15: 500 mL via INTRAVENOUS

## 2019-03-15 MED ORDER — SODIUM CHLORIDE 0.9 % IV BOLUS
1000.0000 mL | Freq: Once | INTRAVENOUS | Status: AC
  Administered 2019-03-15: 06:00:00 1000 mL via INTRAVENOUS

## 2019-03-15 MED ORDER — DEXAMETHASONE SODIUM PHOSPHATE 10 MG/ML IJ SOLN
INTRAMUSCULAR | Status: DC | PRN
  Administered 2019-03-15: 10 mg via INTRAVENOUS

## 2019-03-15 MED ORDER — SODIUM CHLORIDE 0.9 % IV SOLN
8.0000 mg/h | INTRAVENOUS | Status: AC
  Administered 2019-03-15 – 2019-03-18 (×7): 8 mg/h via INTRAVENOUS
  Filled 2019-03-15 (×9): qty 80

## 2019-03-15 MED ORDER — PROPOFOL 10 MG/ML IV BOLUS
INTRAVENOUS | Status: AC
  Filled 2019-03-15: qty 20

## 2019-03-15 MED ORDER — MIDAZOLAM HCL 2 MG/2ML IJ SOLN
INTRAMUSCULAR | Status: AC
  Filled 2019-03-15: qty 2

## 2019-03-15 MED ORDER — NOREPINEPHRINE 4 MG/250ML-% IV SOLN
0.0000 ug/min | INTRAVENOUS | Status: DC
  Administered 2019-03-15: 12:00:00 10 ug/min via INTRAVENOUS

## 2019-03-15 MED ORDER — KCL IN DEXTROSE-NACL 40-5-0.9 MEQ/L-%-% IV SOLN
INTRAVENOUS | Status: AC
  Administered 2019-03-15 (×2): via INTRAVENOUS
  Filled 2019-03-15 (×2): qty 1000

## 2019-03-15 MED ORDER — FENTANYL CITRATE (PF) 100 MCG/2ML IJ SOLN
INTRAMUSCULAR | Status: AC
  Filled 2019-03-15: qty 4

## 2019-03-15 MED ORDER — MIDAZOLAM HCL 2 MG/2ML IJ SOLN
INTRAMUSCULAR | Status: AC
  Administered 2019-03-15: 15:00:00 2 mg
  Filled 2019-03-15: qty 2

## 2019-03-15 MED ORDER — PANTOPRAZOLE SODIUM 40 MG IV SOLR
40.0000 mg | Freq: Two times a day (BID) | INTRAVENOUS | Status: DC
  Administered 2019-03-18 – 2019-03-20 (×4): 40 mg via INTRAVENOUS
  Filled 2019-03-15 (×4): qty 40

## 2019-03-15 MED ORDER — METOCLOPRAMIDE HCL 5 MG/ML IJ SOLN
10.0000 mg | Freq: Once | INTRAMUSCULAR | Status: AC
  Administered 2019-03-15: 06:00:00 10 mg via INTRAVENOUS
  Filled 2019-03-15: qty 2

## 2019-03-15 MED ORDER — SODIUM CHLORIDE 0.9 % IV BOLUS
1000.0000 mL | Freq: Once | INTRAVENOUS | Status: AC
  Administered 2019-03-15: 09:00:00 1000 mL via INTRAVENOUS

## 2019-03-15 MED ORDER — MIDAZOLAM HCL 5 MG/5ML IJ SOLN
INTRAMUSCULAR | Status: DC | PRN
  Administered 2019-03-15: 2 mg via INTRAVENOUS

## 2019-03-15 MED ORDER — SODIUM CHLORIDE 0.9 % IV SOLN
INTRAVENOUS | Status: DC | PRN
  Administered 2019-03-15: 10:00:00 via INTRAVENOUS

## 2019-03-15 MED ORDER — SODIUM CHLORIDE 0.9 % IV BOLUS
1000.0000 mL | Freq: Once | INTRAVENOUS | Status: AC
  Administered 2019-03-15: 1000 mL via INTRAVENOUS

## 2019-03-15 MED ORDER — MIDAZOLAM HCL 2 MG/2ML IJ SOLN
INTRAMUSCULAR | Status: AC
  Filled 2019-03-15: qty 6

## 2019-03-15 MED ORDER — IOHEXOL 300 MG/ML  SOLN
100.0000 mL | Freq: Once | INTRAMUSCULAR | Status: AC | PRN
  Administered 2019-03-15: 15:00:00 30 mL via INTRA_ARTERIAL

## 2019-03-15 SURGICAL SUPPLY — 15 items

## 2019-03-15 NOTE — Sedation Documentation (Signed)
PRBC unit [id number] complete.

## 2019-03-15 NOTE — Sedation Documentation (Signed)
NS bolus stopped due to increase BP.

## 2019-03-15 NOTE — Sedation Documentation (Addendum)
Emergency release PRBC unit [id number] 20 [id number] hung infusing at 999cc/hr.

## 2019-03-15 NOTE — Procedures (Signed)
Active upper GI bleeding from duddenal ulcer  S/p successful GDA angio embo  No comp Stable ebl min Full report in pacs

## 2019-03-15 NOTE — Transfer of Care (Signed)
Immediate Anesthesia Transfer of Care Note  Patient: KINZEE HAPPEL  Procedure(s) Performed: ESOPHAGOGASTRODUODENOSCOPY (EGD) WITH PROPOFOL (N/A )  Patient LocationICU  Anesthesia Type:General  Level of Consciousness: sedated  Airway & Oxygen Therapy: Patient remains intubated per anesthesia plan  Post-op Assessment: Report given to RN and Post -op Vital signs reviewed and stable  Post vital signs: stable  Last Vitals:  Vitals Value Taken Time  BP 132/71 03/15/19 1142  Temp    Pulse 98 03/15/19 1143  Resp 19 03/15/19 1143  SpO2 100 % 03/15/19 1143  Vitals shown include unvalidated device data.  Last Pain:  Vitals:   03/15/19 0903  TempSrc: Temporal  PainSc: 0-No pain         Complications: No apparent anesthesia complications

## 2019-03-15 NOTE — Sedation Documentation (Signed)
Levophed decease to 59mcg/min.

## 2019-03-15 NOTE — Sedation Documentation (Signed)
R groin level 0. Drsg CDI.

## 2019-03-15 NOTE — ED Provider Notes (Signed)
[facility] DEPT Provider Note   CSN: [id number] Arrival date & time: 03/15/19  0449     History   Chief Complaint Chief Complaint  Patient presents with  . GI Bleeding    HPI Ashley [name] is a 53 y.o. female.     Patient presents to the emergency department for evaluation of vomiting blood.  She reports that she has been having problems with her stomach for months.  She has been seen in the ER multiple times for dehydration secondary to nausea and vomiting.  She has required admission for intractable vomiting.  She reports that she has had multiple work-ups for her abdominal pain, nausea and vomiting including CTs and ultrasounds.  She does not have insurance, however, and therefore has not seen GI.  Patient reports that she has noticed dark and tarry stools over the last couple of days and then tonight she had onset of vomiting.  She vomited a large amount of red blood.  She has never had hematemesis before with her symptoms.     Past Medical History:  Diagnosis Date  . GERD (gastroesophageal reflux disease)     Patient Active Problem List   Diagnosis Date Noted  . Acute gastroenteritis 01/01/2019  . Intractable nausea and vomiting 12/31/2018  . AKI (acute kidney injury) ([place]) 12/31/2018  . GERD (gastroesophageal reflux disease) 12/31/2018    Past Surgical History:  Procedure Laterality Date  . TUBAL LIGATION       OB History   No obstetric history on file.      Home Medications    Prior to Admission medications   Medication Sig Start Date End Date Taking? Authorizing Provider  dicyclomine (BENTYL) 10 MG capsule Take 1 capsule (10 mg total) by mouth every 6 (six) hours as needed for spasms. 02/25/19   Merlyn Lot, MD  HYDROcodone-acetaminophen (NORCO/VICODIN) 5-325 MG tablet Take 1 tablet by mouth every 4 (four) hours as needed for severe pain. 01/02/19   Pyreddy, Reatha Harps, MD  nicotine (NICODERM CQ - DOSED IN MG/24 HOURS) 14  mg/24hr patch Place 1 patch (14 mg total) onto the skin daily. 01/03/19   Saundra Shelling, MD  ondansetron (ZOFRAN) 4 MG tablet Take 1 tablet (4 mg total) by mouth every 6 (six) hours as needed for nausea. 01/02/19   Saundra Shelling, MD  pantoprazole (PROTONIX) 40 MG tablet Take 1 tablet (40 mg total) by mouth daily. 01/03/19 02/02/19  Saundra Shelling, MD  promethazine (PHENERGAN) 12.5 MG tablet Take 1 tablet (12.5 mg total) by mouth every 6 (six) hours as needed. 02/25/19   Merlyn Lot, MD    Family History No family history on file.  Social History Social History   Tobacco Use  . Smoking status: Current Every Day Smoker    Types: Cigarettes  . Smokeless tobacco: Never Used  Substance Use Topics  . Alcohol use: No  . Drug use: No     Allergies   Patient has no known allergies.   Review of Systems Review of Systems  Gastrointestinal: Positive for abdominal pain, nausea and vomiting.  All other systems reviewed and are negative.    Physical Exam Updated Vital Signs BP 98/72 (BP Location: Left Arm)   Pulse (!) 133   Temp 97.9 F (36.6 C) (Oral)   Resp (!) 24   Wt 63.5 kg   SpO2 98%   BMI 24.03 kg/m   Physical Exam Vitals signs and nursing note reviewed.  Constitutional:  General: She is not in acute distress.    Appearance: Normal appearance. She is well-developed.  HENT:     Head: Normocephalic and atraumatic.     Right Ear: Hearing normal.     Left Ear: Hearing normal.     Nose: Nose normal.  Eyes:     Conjunctiva/sclera: Conjunctivae normal.     Pupils: Pupils are equal, round, and reactive to light.  Neck:     Musculoskeletal: Normal range of motion and neck supple.  Cardiovascular:     Rate and Rhythm: Regular rhythm.     Heart sounds: S1 normal and S2 normal. No murmur. No friction rub. No gallop.   Pulmonary:     Effort: Pulmonary effort is normal. No respiratory distress.     Breath sounds: Normal breath sounds.  Chest:     Chest wall: No  tenderness.  Abdominal:     General: Bowel sounds are normal.     Palpations: Abdomen is soft.     Tenderness: There is generalized abdominal tenderness. There is no guarding or rebound. Negative signs include Murphy's sign and McBurney's sign.     Hernia: No hernia is present.  Musculoskeletal: Normal range of motion.  Skin:    General: Skin is warm and dry.     Findings: No rash.  Neurological:     Mental Status: She is alert and oriented to person, place, and time.     GCS: GCS eye subscore is 4. GCS verbal subscore is 5. GCS motor subscore is 6.     Cranial Nerves: No cranial nerve deficit.     Sensory: No sensory deficit.     Coordination: Coordination normal.  Psychiatric:        Speech: Speech normal.        Behavior: Behavior normal.        Thought Content: Thought content normal.      ED Treatments / Results  Labs (all labs ordered are listed, but only abnormal results are displayed) Labs Reviewed  COMPREHENSIVE METABOLIC PANEL  CBC  PROTIME-INR  TYPE AND SCREEN    EKG None  Radiology No results found.  Procedures Procedures (including critical care time)  Medications Ordered in ED Medications  sodium chloride 0.9 % bolus 1,000 mL (has no administration in time range)     Initial Impression / Assessment and Plan / ED Course  I have reviewed the triage vital signs and the nursing notes.  Pertinent labs & imaging results that were available during my care of the patient were reviewed by me and considered in my medical decision making (see chart for details).        Patient presents to the emergency department for evaluation of abdominal pain and hematemesis.  Patient has had recurrent episodes of abdominal pain, back pain associated with intractable nausea and vomiting requiring hospitalization over the last 6 or 8 months.  Tonight, however, she had onset of hematemesis.  She has not had this previously.  She also has noticed dark stools, likely  melena.  She was slightly hypotensive and tachycardic at arrival.  She is not significantly anemic, however, blood pressure improved with IV fluids.  She likely had some element of dehydration.  She has not been eating or drinking for the last 4 days.  Hemoglobin is 2 g lower than it was 2 weeks ago.  This will require monitoring.  Discussed with Dr. Adela LankArmbruster, on-call for GI.  Recommends proton pump inhibitor infusion, n.p.o. status, will see patient  this morning.  Will admit to hospitalist.  CRITICAL CARE Performed by: Gilda Crease   Total critical care time: 30 minutes  Critical care time was exclusive of separately billable procedures and treating other patients.  Critical care was necessary to treat or prevent imminent or life-threatening deterioration.  Critical care was time spent personally by me on the following activities: development of treatment plan with patient and/or surrogate as well as nursing, discussions with consultants, evaluation of patient's response to treatment, examination of patient, obtaining history from patient or surrogate, ordering and performing treatments and interventions, ordering and review of laboratory studies, ordering and review of radiographic studies, pulse oximetry and re-evaluation of patient's condition.   Final Clinical Impressions(s) / ED Diagnoses   Final diagnoses:  None    ED Discharge Orders    None       , Canary Brim, MD 03/15/19 [phone]

## 2019-03-15 NOTE — ED Triage Notes (Signed)
Per EMS: Pt coming from home with a c/o abdominal pain with GI hemorrhage. Pt reports she has been vomiting bright red blood since 0300 on 03/15/2019. Pt reports abdominal pain that is cramping in upper abdomen. Pt has a hx of abdominal problems since January. She received 2 mg of Zofran with EMS.  Vitals: BP 110/72 HR 110 SPO2 99%  CBG 189  20G Right AC

## 2019-03-15 NOTE — Sedation Documentation (Signed)
Levophed stopped

## 2019-03-15 NOTE — ED Notes (Signed)
Patient states that her pain has worsened and is crying, stating "I need something for pain. This is different than before and worse." Patient is grasping at her lower abdomen and complaining of a burning pain. EDP made aware.

## 2019-03-15 NOTE — Sedation Documentation (Addendum)
Levophed decrease to 39mcg/min. Pt intravascularly clamped down making procedure challenging.

## 2019-03-15 NOTE — Sedation Documentation (Signed)
R groin level 0. Drsg CDI.  

## 2019-03-15 NOTE — Consult Note (Signed)
NAME:  Ashley [name], MRN:  [id number], DOB:  Jul 19, 1965, LOS: 0 ADMISSION DATE:  03/15/2019, CONSULTATION DATE:  03/15/19 REFERRING MD:  Ileene Patrick, MD CHIEF COMPLAINT:  Post-op vent management  Brief History   53 year old female who presented to ED with melena and hematemesis. She underwent EGD 9/13 and transferred to ICU intubated for upper GIB.  History of present illness   Ms. Ashley [name] is a 53 year old current smoker (1 PPD) with hx of abdominal pain, intractable nausea and vomiting since January who presented to the ED with abdominal pain/cramping associated with melena x 2-3 days and new onset of hematemesis x 1 day. Hematemesis began prior to admission and had an episode in the ED with bright red blood. GI was consulted. She was intubated for EGD. EGD with blood clots but no focal area of bleeding per GI. Post-procedure, patient transferred from endoscopy to ICU for concerns of ongoing bleed and need to remain intubated. PCCM consulted for upper GIB requiring mechanical ventilation.  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 No active bleeding  Micro Data:    Antimicrobials:    Interim history/subjective:  As above  Objective   Blood pressure (!) 117/47, pulse (!) 102, temperature 98.6 F (37 C), temperature source Temporal, resp. rate (!) 21, height 5\' 4"  (1.626 m), weight 63.5 kg, SpO2 100 %.        Intake/Output Summary (Last 24 hours) at 03/15/2019 1035 Last data filed at 03/15/2019 0832 Gross per 24 hour  Intake 1700 ml  Output -  Net 1700 ml   Filed Weights   03/15/19 0506 03/15/19 0535  Weight: 63.5 kg 63.5 kg   Physical Exam:  General: Chronically ill-appearing, on mechanical ventilation, sedated HENT: [place], AT, ETT in place Eyes: EOMI, no scleral icterus Respiratory: Clear to auscultation bilaterally.  No crackles, wheezing or rales Cardiovascular: RRR, -M/R/G, no JVD GI: BS+, soft, nontender Extremities:-Edema,-tenderness Neuro: AAO x4, CNII-XII grossly intact GU: Foley in place  Resolved Hospital Problem list     Assessment & Plan:   Acute respiratory insufficiency Wean FIO2/PEEP for goal SpO2 88-95% Daily WUA and PS as tolerated Remain intubated due to critical illness PAD protocol for goal -2  UGIB secondary to gastric ulcers Acute blood loss anemia S/p PRBC x 1 for Hg 7.1 in setting of active bleed CTA per IR Trend CBC PPI gtt  Hypokalemia S/p IV repletion Repeat BMP  [name] practice:  Diet: NPO Pain/Anxiety/Delirium protocol (if indicated): Yes VAP protocol (if indicated): Yes DVT prophylaxis: Holding in setting of bleed GI prophylaxis: PPI gtt Glucose control: CBG q4h Mobility: BR Code Status: Full Family Communication: Unable to reach family. GI left message with friend. Disposition: Transfer to ICU  Labs   CBC: Recent Labs  Lab 03/15/19 0524  WBC 24.1*  HGB 10.6*  HCT 32.1*  MCV 84.7  PLT 501*    Basic Metabolic Panel: Recent Labs  Lab 03/15/19 0524  NA 134*  K 2.4*  CL 103  CO2 17*  GLUCOSE 131*  BUN 15  CREATININE 0.84  CALCIUM 8.3*  MG 2.1   GFR: Estimated Creatinine Clearance: 68.4 mL/min (by C-G formula based on SCr of  0.84 mg/dL). Recent Labs  Lab 03/15/19 0524  WBC 24.1*    Liver Function Tests: Recent Labs  Lab 03/15/19 0524  AST 22  ALT 13  ALKPHOS 128*  BILITOT 0.4  PROT 6.8  ALBUMIN 3.5   No results for input(s): LIPASE, AMYLASE in the last 168 hours. No results for input(s): AMMONIA in the last 168 hours.  ABG    Component Value Date/Time   HCO3 22.6 05/05/2007 1605   TCO2 21 03/15/2011 0026   ACIDBASEDEF 3.0 (H) 05/05/2007  1605     Coagulation Profile: Recent Labs  Lab 03/15/19 0524  INR 1.1    Cardiac Enzymes: No results for input(s): CKTOTAL, CKMB, CKMBINDEX, TROPONINI in the last 168 hours.  HbA1C: No results found for: HGBA1C  CBG: No results for input(s): GLUCAP in the last 168 hours.  Review of Systems:   Unable to obtain due to intubated status  Past Medical History  She,  has a past medical history of GERD (gastroesophageal reflux disease).   Surgical History    Past Surgical History:  Procedure Laterality Date  . TUBAL LIGATION       Social History   reports that she has been smoking cigarettes. She has never used smokeless tobacco. She reports that she does not drink alcohol or use drugs.   Family History   Her family history is not on file.   Allergies No Known Allergies   Home Medications  Prior to Admission medications   Medication Sig Start Date End Date Taking? Authorizing Provider  aspirin-acetaminophen-caffeine (EXCEDRIN MIGRAINE) [phone] MG tablet Take 1 tablet by mouth every 6 (six) hours as needed for headache.   Yes [provider]  dicyclomine (BENTYL) 10 MG capsule Take 1 capsule (10 mg total) by mouth every 6 (six) hours as needed for spasms. Patient not taking: Reported on 03/15/2019 02/25/19   Willy Eddyobinson, Patrick, MD  HYDROcodone-acetaminophen (NORCO/VICODIN) 5-325 MG tablet Take 1 tablet by mouth every 4 (four) hours as needed for severe pain. Patient not taking: Reported on 03/15/2019 01/02/19   Ihor AustinPyreddy, Pavan, MD  nicotine (NICODERM CQ - DOSED IN MG/24 HOURS) 14 mg/24hr patch Place 1 patch (14 mg total) onto the skin daily. Patient not taking: Reported on 03/15/2019 01/03/19   Ihor AustinPyreddy, Pavan, MD  ondansetron (ZOFRAN) 4 MG tablet Take 1 tablet (4 mg total) by mouth every 6 (six) hours as needed for nausea. Patient not taking: Reported on 03/15/2019 01/02/19   Ihor AustinPyreddy, Pavan, MD  pantoprazole (PROTONIX) 40 MG tablet Take 1 tablet (40 mg total) by mouth  daily. Patient not taking: Reported on 03/15/2019 01/03/19 02/02/19  Ihor AustinPyreddy, Pavan, MD  promethazine (PHENERGAN) 12.5 MG tablet Take 1 tablet (12.5 mg total) by mouth every 6 (six) hours as needed. Patient not taking: Reported on 03/15/2019 02/25/19   Willy Eddyobinson, Patrick, MD     Critical care time: 45 min    The patient is critically ill with multiple organ systems failure and requires high complexity decision making for assessment and support, frequent evaluation and titration of therapies, application of advanced monitoring technologies and extensive interpretation of multiple databases.   Critical Care Time devoted to patient care services described in this note is 45 Minutes. This time reflects time of care of this signee Dr. Mechele CollinJane Zahmir Lalla. This critical care time does not reflect procedure time, or teaching time or supervisory time of PA/NP/Med student/Med Resident etc but could involve care discussion time.  Mechele CollinJane Karmin Kasprzak, M.D. [facility] 03/15/2019 12:02 PM  Pager: [phone] After hours pager: [phone]

## 2019-03-15 NOTE — Sedation Documentation (Signed)
Levophed decrease to 44mcg/min.

## 2019-03-15 NOTE — Sedation Documentation (Signed)
Emergency release unit [id number] hung, infusing at 999cc/hr.

## 2019-03-15 NOTE — Sedation Documentation (Signed)
Gauze/tegaderm bandage applied to R fem artery puncture site. Drsg CDI. R groin level 0. RDP/RPT Doppler +.

## 2019-03-15 NOTE — Sedation Documentation (Signed)
Levophed increased for 81mcg/min to 31mcg/min. NS at 999cc/hr. Called for emergency release blood.

## 2019-03-15 NOTE — ED Notes (Signed)
Date and time results received: 03/15/19 6:15 AM  Test: Potassium Critical Value: 2.4  Name of Provider Notified: Pollina  Orders Received? Or Actions Taken?:

## 2019-03-15 NOTE — Progress Notes (Signed)
GI UPDATE:  Patient underwent EGD this AM. Stomach was filled with blood / clots which took several minutes to clear. She has a very large ulcer at the pyloric channel into the bulb, large flat adherent clot on it, could not visualize the base, no active bleeding proximal to it but cannot tell how far this extends into the duodenum and potential for distal bleeding. This is not amenable to endoscopic therapy if she has continued blood loss based on endoscopic appearance this AM. We will keep her intubated to protect her airway and minimize risk for aspiration for now. I have discussed her case with IR Dr. Annamaria Boots - will proceed with CTA to evaluate her vasculature, as embolization will be needed if she rebleeds, and he may do this regardless given the size of the lesion and likelihood for rebleeding. CT will also rule out any free air / contained perforation given the elevated WBC. Would continue IV protonix otherwise. I have attempted to contact family / friends. There is only one contact in the chart listed, one of her friends, I left a voicemail for them to call back, no answer. Will continue to follow.  Tioga Cellar, MD [facility]

## 2019-03-15 NOTE — Anesthesia Postprocedure Evaluation (Signed)
Anesthesia Post Note  Patient: Ashley [name]  Procedure(s) Performed: ESOPHAGOGASTRODUODENOSCOPY (EGD) WITH PROPOFOL (N/A )     Patient location during evaluation: ICU Anesthesia Type: General Level of consciousness: patient remains intubated per anesthesia plan and sedated Pain management: pain level controlled Vital Signs Assessment: post-procedure vital signs reviewed and stable Respiratory status: patient on ventilator - see flowsheet for VS Cardiovascular status: blood pressure returned to baseline and stable Postop Assessment: no apparent nausea or vomiting Anesthetic complications: no    Last Vitals:  Vitals:   03/15/19 0903 03/15/19 1115  BP: (!) 117/47   Pulse: (!) 102 84  Resp: (!) 21 14  Temp: 37 C   SpO2: 100% 100%    Last Pain:  Vitals:   03/15/19 0903  TempSrc: Temporal  PainSc: 0-No pain                 Chasmine Lender L Leomia Blake

## 2019-03-15 NOTE — H&P (View-Only) (Signed)
Consultation  Referring Provider:     Dr. Mahala Menghini Primary Care Physician:  Patient, No Pcp Per Primary Gastroenterologist:        NA Reason for Consultation:     GI bleed         HPI:   Ashley [name] is a 53 y.o. female, admitted to the hospital with symptoms concerning for a GI bleed, we were consulted to assist in management.  The patient has had had symptoms of intermittent nausea and vomiting for several months.  She is also had postprandial abdominal pain, loss of appetite, and several pounds of weight loss over the past few months.  She has been seen in the emergency department a few times for this issue.  She had a CT scan of her abdomen and pelvis on June 30 which did not reveal any significant pathology.  She had a small hiatal hernia on that exam.  On August 26 she had an right upper quadrant ultrasound done knowing no gallstones in the gallbladder, normal common bile duct.  Small cyst in the liver.  She has been using some occasional NSAIDs for pain.  She had worsening abdominal pain that brought her to the emergency room recently.  She has noticed 2 days of dark/tarry stools which is new for her.  In the emergency department she vomited a large amount of red blood which is the first time she is ever seen this.  She endorses Excedrin and some other NSAID use recently for pain.  She is never had a prior upper endoscopy.  She is never had a colonoscopy.  She denies any history of peptic ulcer disease.  In the emergency department she is mildly tachycardic and mildly hypotensive, her hemoglobin was in the tens.  She had hypokalemia with potassium of 2.4.  She was given potassium supplementation and IV fluids.  He had no other further episodes of vomiting since she is been in the emergency department.  She denies much medical history.  He does not take many medications.  She was given a trial of Protonix back in July which she does not appear to have taken.  She was started on IV Protonix  in the emergency department.  She of note has a white blood cell count of 24. No fevers.  Past Medical History:  Diagnosis Date   GERD (gastroesophageal reflux disease)     Past Surgical History:  Procedure Laterality Date   TUBAL LIGATION      History reviewed. No pertinent family history.   Social History   Tobacco Use   Smoking status: Current Every Day Smoker    Types: Cigarettes   Smokeless tobacco: Never Used  Substance Use Topics   Alcohol use: No   Drug use: No    Prior to Admission medications   Medication Sig Start Date End Date Taking? Authorizing Provider  aspirin-acetaminophen-caffeine (EXCEDRIN MIGRAINE) [phone] MG tablet Take 1 tablet by mouth every 6 (six) hours as needed for headache.   Yes [provider]  dicyclomine (BENTYL) 10 MG capsule Take 1 capsule (10 mg total) by mouth every 6 (six) hours as needed for spasms. Patient not taking: Reported on 03/15/2019 02/25/19   Willy Eddy, MD  HYDROcodone-acetaminophen (NORCO/VICODIN) 5-325 MG tablet Take 1 tablet by mouth every 4 (four) hours as needed for severe pain. Patient not taking: Reported on 03/15/2019 01/02/19   Ihor Austin, MD  nicotine (NICODERM CQ - DOSED IN MG/24 HOURS) 14 mg/24hr patch Place 1  patch (14 mg total) onto the skin daily. Patient not taking: Reported on 03/15/2019 01/03/19   Saundra Shelling, MD  ondansetron (ZOFRAN) 4 MG tablet Take 1 tablet (4 mg total) by mouth every 6 (six) hours as needed for nausea. Patient not taking: Reported on 03/15/2019 01/02/19   Saundra Shelling, MD  pantoprazole (PROTONIX) 40 MG tablet Take 1 tablet (40 mg total) by mouth daily. Patient not taking: Reported on 03/15/2019 01/03/19 02/02/19  Saundra Shelling, MD  promethazine (PHENERGAN) 12.5 MG tablet Take 1 tablet (12.5 mg total) by mouth every 6 (six) hours as needed. Patient not taking: Reported on 03/15/2019 02/25/19   Merlyn Lot, MD    Current Facility-Administered Medications  Medication  Dose Route Frequency Provider Last Rate Last Dose   dextrose 5 % and 0.9 % NaCl with KCl 40 mEq/L infusion   Intravenous Continuous Nita Sells, MD 100 mL/hr at 03/15/19 0918     [MAR Hold] fentaNYL (SUBLIMAZE) injection 25 mcg  25 mcg Intravenous Once Pollina, Gwenyth Allegra, MD       lactated ringers infusion   Intravenous Continuous Dainel Arcidiacono, Carlota Raspberry, MD 20 mL/hr at 03/15/19 0921     pantoprazole (PROTONIX) 80 mg in sodium chloride 0.9 % 250 mL (0.32 mg/mL) infusion  8 mg/hr Intravenous Continuous Orpah Greek, MD 25 mL/hr at 03/15/19 0704 8 mg/hr at 03/15/19 0704   [MAR Hold] pantoprazole (PROTONIX) injection 40 mg  40 mg Intravenous Q12H Orpah Greek, MD        Allergies as of 03/15/2019   (No Known Allergies)     Review of Systems:    As per HPI, otherwise negative    Physical Exam:  Vital signs in last 24 hours: Temp:  [97.9 F (36.6 C)-98.7 F (37.1 C)] 98.6 F (37 C) (09/13 0903) Pulse Rate:  [102-153] 102 (09/13 0903) Resp:  [16-33] 21 (09/13 0903) BP: (87-117)/(47-86) 117/47 (09/13 0903) SpO2:  [97 %-100 %] 100 % (09/13 0903) Weight:  [63.5 kg] 63.5 kg (09/13 0535)   General:   Pleasant female in NAD Head:  Normocephalic and atraumatic. Eyes:   No icterus.   Conjunctiva pink. Ears:  Normal auditory acuity. Neck:  Supple Lungs:  Respirations even and unlabored. Lungs clear to auscultation bilaterally.    Heart:  Mildly tachy, regular Abdomen:  Soft, nondistended, mild upper abdominal tenderness Rectal:  Not performed.  Msk:  Symmetrical without gross deformities.  Extremities:  Without edema. Neurologic:  Alert and  oriented x4;  grossly normal neurologically. Skin:  Intact without significant lesions or rashes. Psych:  Alert and cooperative. Normal affect.  LAB RESULTS: Recent Labs    03/15/19 0524  WBC 24.1*  HGB 10.6*  HCT 32.1*  PLT 501*   BMET Recent Labs    03/15/19 0524  NA 134*  K 2.4*  CL 103  CO2 17*    GLUCOSE 131*  BUN 15  CREATININE 0.84  CALCIUM 8.3*   LFT Recent Labs    03/15/19 0524  PROT 6.8  ALBUMIN 3.5  AST 22  ALT 13  ALKPHOS 128*  BILITOT 0.4   PT/INR Recent Labs    03/15/19 0524  LABPROT 14.3  INR 1.1    STUDIES: No results found.      Impression / Plan:  53 year old female with history as outlined above, admitted with symptoms concerning for an upper GI bleed.  She has had several months worth of intermittent nausea and vomiting, postprandial upper abdominal pain, in the setting of  NSAID use, now presenting with melena and one episode of large-volume hematemesis. CT imaging a few months ago did not show any concerning pathology, right upper quadrant ultrasound looked okay.  I am most concerned about peptic ulcer disease in light of her bleeding symptoms.  She was given additional IV fluids in the ER since I've seen her initially and looks resuscitated enough for an endoscopy this morning.  I am recommending an EGD to further evaluate her symptoms.  I discussed risk and benefits of EGD and anesthesia with her and she wants to proceed.  Her white count is elevated, but her abdomen is extremely soft at this time on exam, I think likelihood for perforating ulcer disease is low.  Further recommendations pending EGD, will continue IV Protonix in the interim.  Potassium being repleted in the ER and per anesthesia at time of endoscopy.  Patient agreed with the plan, all questions answered.Ileene Patrick.   Arienne Gartin, MD [facility]

## 2019-03-15 NOTE — Op Note (Signed)
[facility] Patient Name: Ashley [name] Procedure Date: 03/15/2019 MRN: [id number] Attending MD: Carlota Raspberry. Havery Moros , MD Date of Birth: 1966/01/26 CSN: [id number] Age: 53 Admit Type: Inpatient Procedure:                Upper GI endoscopy Indications:              Hematemesis, Melena, patient electively intubated                            for this exam per anesthesia Providers:                Carlota Raspberry. Havery Moros, MD, Ashley Jacobs, RN,                            Laverda Sorenson, Technician, Enrigue Catena, CRNA Referring MD:              Medicines:                Monitored Anesthesia Care Complications:            No immediate complications. Estimated blood loss:                            Minimal. Estimated Blood Loss:     Estimated blood loss was minimal. Procedure:                Pre-Anesthesia Assessment:                           - Prior to the procedure, a History and Physical                            was performed, and patient medications and                            allergies were reviewed. The patient's tolerance of                            previous anesthesia was also reviewed. The risks                            and benefits of the procedure and the sedation                            options and risks were discussed with the patient.                            All questions were answered, and informed consent                            was obtained. Prior Anticoagulants: The patient has                            taken no previous anticoagulant or antiplatelet  agents. ASA Grade Assessment: III - A patient with                            severe systemic disease. After reviewing the risks                            and benefits, the patient was deemed in                            satisfactory condition to undergo the procedure.                           After obtaining informed consent, the endoscope was       passed under direct vision. Throughout the                            procedure, the patient's blood pressure, pulse, and                            oxygen saturations were monitored continuously. The                            GIF-H190 ([id number] Olympus gastroscope was                            introduced through the mouth, and advanced to the                            duodenal bulb. The upper GI endoscopy was performed                            with difficulty due to excessive bleeding. The                            patient tolerated the procedure well. Scope In: Scope Out: Findings:      Esophagitis was found in the distal esophagus.      The exam of the esophagus was otherwise normal.      Clotted blood was found in the entire examined stomach, worst in the       fundus and antrum - this was removed with roth net, suctioning, and       transitioned to therapeutic endoscope for suctioning. It took several       minutes to clear the stomach for adequate visualization.      Many gastric ulcers were found in the gastric antrum and body, ranging       from a few mm to 8-57m, some were superficial, some cratered, and a few       superficial linear ulcerations. None of these had any stigmata for       bleeding. At the pylorus and extending into the duodenal bulb was a very       large cratered ulcer, I suspect the source of the patient's bleeding.       The lumen of the bulb was obliterated due to edema and ulcer, was not       traversed.  There was adherent clot at the base of the ulcer but no       active bleeding appreciated after lavage. No endoscopic therapy was       performed given no active bleeding noted, and location and size of the       ulcer, no focal vessel identified to treat. I could not visualize the       entire size of the ulcer due to the edema.      . Impression:               - Esophagitis.                           - Clotted blood in the entire stomach which  took                            several minutes to clear in order to complete the                            exam.                           - Multiple clean based gastric ulcers as outlined                            above.                           - Large cratered ulcer at the pylorus extending                            into the dulb with adherent clot, could not                            visualize it in entirety given its size and that it                            obliterates the lumen of the dulb - this is the                            source of the patient's recent bleeding. No active                            bleeding appreciated durign the exam. No endoscopic                            therapy applied as above. Moderate Sedation:      No moderate sedation, case performed with MAC Recommendation:           - transfer to the ICU for ongoing care.                           - NPO                           - I would keep the patient intubated  for now given                            the volume of blood in the stomach and risk for                            continued bleeding / aspiration                           - CT scan abdomen without oral contrast to ensure                            no contained perforation / free air given size of                            the ulcer and WBC elevation. The patient's abdomen                            remained soft during the procedure                           - serial Hgb, transfuse RBC as needed                           - continue IV protonix drip                           - if patient has persistent bleeding will need IR                            embolization to treat this given endoscopic                            appearance                           - H pylori IgG serology, treat if positive                           - GI service will follow, call with questions Procedure Code(s):        --- Professional ---                            [phone], Esophagogastroduodenoscopy, flexible,                            transoral; diagnostic, including collection of                            specimen(s) by brushing or washing, when performed                            (separate procedure) Diagnosis Code(s):        --- Professional ---  K20.9, Esophagitis, unspecified                           K92.2, Gastrointestinal hemorrhage, unspecified                           K25.4, Chronic or unspecified gastric ulcer with                            hemorrhage                           K26.4, Chronic or unspecified duodenal ulcer with                            hemorrhage                           K92.0, Hematemesis                           K92.1, Melena (includes Hematochezia) CPT copyright 2019 [employer]. All rights reserved. The codes documented in this report are preliminary and upon coder review may  be revised to meet current compliance requirements. Remo Lipps P. Vaughan Garfinkle, MD 03/15/2019 10:59:18 AM This report has been signed electronically. Number of Addenda: 0

## 2019-03-15 NOTE — Sedation Documentation (Signed)
Emergency PRBC unit [id number] 20 [id number] complete.

## 2019-03-15 NOTE — Anesthesia Procedure Notes (Signed)
Procedure Name: Intubation Date/Time: 03/15/2019 9:58 AM Performed by: Lissa Morales, CRNA Pre-anesthesia Checklist: Patient identified, Emergency Drugs available, Suction available and Patient being monitored Patient Re-evaluated:Patient Re-evaluated prior to induction Oxygen Delivery Method: Circle system utilized Preoxygenation: Pre-oxygenation with 100% oxygen Induction Type: IV induction, Rapid sequence and Cricoid Pressure applied Laryngoscope Size: Glidescope and 4 Grade View: Grade II Tube type: Oral Tube size: 7.0 mm Number of attempts: 1 Airway Equipment and Method: Stylet and Oral airway Placement Confirmation: ETT inserted through vocal cords under direct vision,  positive ETCO2 and breath sounds checked- equal and bilateral Secured at: 21 cm Tube secured with: Tape Dental Injury: Teeth and Oropharynx as per pre-operative assessment

## 2019-03-15 NOTE — Sedation Documentation (Addendum)
Emergency release PRBC unit [id number] 20 [id number] Infusion complete.

## 2019-03-15 NOTE — ED Notes (Signed)
Pt transported to endoscopy. AAOx4.

## 2019-03-15 NOTE — Sedation Documentation (Signed)
5 Fr sheath removed from R Femoral artery by Dr. Annamaria Boots. Hemostasis achieved using Exoseal closure device. Groin level 0, RDP Doppler +, RPT Doppler +.

## 2019-03-15 NOTE — Consult Note (Signed)
° ° ° °Consultation ° °Referring Provider:     Dr. Samtani °Primary Care Physician:  Patient, No Pcp Per °Primary Gastroenterologist:        NA °Reason for Consultation:     GI bleed °       ° HPI:   °Ashley [name] is a 53 y.o. female, admitted to the hospital with symptoms concerning for a GI bleed, we were consulted to assist in management. ° °The patient has had had symptoms of intermittent nausea and vomiting for several months.  She is also had postprandial abdominal pain, loss of appetite, and several pounds of weight loss over the past few months.  She has been seen in the emergency department a few times for this issue.  She had a CT scan of her abdomen and pelvis on June 30 which did not reveal any significant pathology.  She had a small hiatal hernia on that exam.  On August 26 she had an right upper quadrant ultrasound done knowing no gallstones in the gallbladder, normal common bile duct.  Small cyst in the liver. ° °She has been using some occasional NSAIDs for pain.  She had worsening abdominal pain that brought her to the emergency room recently.  She has noticed 2 days of dark/tarry stools which is new for her.  In the emergency department she vomited a large amount of red blood which is the first time she is ever seen this.  She endorses Excedrin and some other NSAID use recently for pain.  She is never had a prior upper endoscopy.  She is never had a colonoscopy.  She denies any history of peptic ulcer disease.  In the emergency department she is mildly tachycardic and mildly hypotensive, her hemoglobin was in the tens.  She had hypokalemia with potassium of 2.4.  She was given potassium supplementation and IV fluids.  He had no other further episodes of vomiting since she is been in the emergency department.  She denies much medical history.  He does not take many medications.  She was given a trial of Protonix back in July which she does not appear to have taken.  She was started on IV Protonix  in the emergency department.  She of note has a white blood cell count of 24. No fevers. ° °Past Medical History:  °Diagnosis Date  °• GERD (gastroesophageal reflux disease)   ° ° °Past Surgical History:  °Procedure Laterality Date  °• TUBAL LIGATION    ° ° °History reviewed. No pertinent family history.  ° °Social History  ° °Tobacco Use  °• Smoking status: Current Every Day Smoker  °  Types: Cigarettes  °• Smokeless tobacco: Never Used  °Substance Use Topics  °• Alcohol use: No  °• Drug use: No  ° ° °Prior to Admission medications   °Medication Sig Start Date End Date Taking? Authorizing Provider  °aspirin-acetaminophen-caffeine (EXCEDRIN MIGRAINE) 250-250-65 MG tablet Take 1 tablet by mouth every 6 (six) hours as needed for headache.   Yes [provider]  °dicyclomine (BENTYL) 10 MG capsule Take 1 capsule (10 mg total) by mouth every 6 (six) hours as needed for spasms. °Patient not taking: Reported on 03/15/2019 02/25/19   Robinson, Patrick, MD  °HYDROcodone-acetaminophen (NORCO/VICODIN) 5-325 MG tablet Take 1 tablet by mouth every 4 (four) hours as needed for severe pain. °Patient not taking: Reported on 03/15/2019 01/02/19   Pyreddy, Pavan, MD  °nicotine (NICODERM CQ - DOSED IN MG/24 HOURS) 14 mg/24hr patch Place 1   patch (14 mg total) onto the skin daily. Patient not taking: Reported on 03/15/2019 01/03/19   Saundra Shelling, MD  ondansetron (ZOFRAN) 4 MG tablet Take 1 tablet (4 mg total) by mouth every 6 (six) hours as needed for nausea. Patient not taking: Reported on 03/15/2019 01/02/19   Saundra Shelling, MD  pantoprazole (PROTONIX) 40 MG tablet Take 1 tablet (40 mg total) by mouth daily. Patient not taking: Reported on 03/15/2019 01/03/19 02/02/19  Saundra Shelling, MD  promethazine (PHENERGAN) 12.5 MG tablet Take 1 tablet (12.5 mg total) by mouth every 6 (six) hours as needed. Patient not taking: Reported on 03/15/2019 02/25/19   Merlyn Lot, MD    Current Facility-Administered Medications  Medication  Dose Route Frequency Provider Last Rate Last Dose   dextrose 5 % and 0.9 % NaCl with KCl 40 mEq/L infusion   Intravenous Continuous Nita Sells, MD 100 mL/hr at 03/15/19 0918     [MAR Hold] fentaNYL (SUBLIMAZE) injection 25 mcg  25 mcg Intravenous Once Pollina, Gwenyth Allegra, MD       lactated ringers infusion   Intravenous Continuous Lalia Loudon, Carlota Raspberry, MD 20 mL/hr at 03/15/19 0921     pantoprazole (PROTONIX) 80 mg in sodium chloride 0.9 % 250 mL (0.32 mg/mL) infusion  8 mg/hr Intravenous Continuous Orpah Greek, MD 25 mL/hr at 03/15/19 0704 8 mg/hr at 03/15/19 0704   [MAR Hold] pantoprazole (PROTONIX) injection 40 mg  40 mg Intravenous Q12H Orpah Greek, MD        Allergies as of 03/15/2019   (No Known Allergies)     Review of Systems:    As per HPI, otherwise negative    Physical Exam:  Vital signs in last 24 hours: Temp:  [97.9 F (36.6 C)-98.7 F (37.1 C)] 98.6 F (37 C) (09/13 0903) Pulse Rate:  [102-153] 102 (09/13 0903) Resp:  [16-33] 21 (09/13 0903) BP: (87-117)/(47-86) 117/47 (09/13 0903) SpO2:  [97 %-100 %] 100 % (09/13 0903) Weight:  [63.5 kg] 63.5 kg (09/13 0535)   General:   Pleasant female in NAD Head:  Normocephalic and atraumatic. Eyes:   No icterus.   Conjunctiva pink. Ears:  Normal auditory acuity. Neck:  Supple Lungs:  Respirations even and unlabored. Lungs clear to auscultation bilaterally.    Heart:  Mildly tachy, regular Abdomen:  Soft, nondistended, mild upper abdominal tenderness Rectal:  Not performed.  Msk:  Symmetrical without gross deformities.  Extremities:  Without edema. Neurologic:  Alert and  oriented x4;  grossly normal neurologically. Skin:  Intact without significant lesions or rashes. Psych:  Alert and cooperative. Normal affect.  LAB RESULTS: Recent Labs    03/15/19 0524  WBC 24.1*  HGB 10.6*  HCT 32.1*  PLT 501*   BMET Recent Labs    03/15/19 0524  NA 134*  K 2.4*  CL 103  CO2 17*    GLUCOSE 131*  BUN 15  CREATININE 0.84  CALCIUM 8.3*   LFT Recent Labs    03/15/19 0524  PROT 6.8  ALBUMIN 3.5  AST 22  ALT 13  ALKPHOS 128*  BILITOT 0.4   PT/INR Recent Labs    03/15/19 0524  LABPROT 14.3  INR 1.1    STUDIES: No results found.      Impression / Plan:  53 year old female with history as outlined above, admitted with symptoms concerning for an upper GI bleed.  She has had several months worth of intermittent nausea and vomiting, postprandial upper abdominal pain, in the setting of  NSAID use, now presenting with melena and one episode of large-volume hematemesis. CT imaging a few months ago did not show any concerning pathology, right upper quadrant ultrasound looked okay.  I am most concerned about peptic ulcer disease in light of her bleeding symptoms.  She was given additional IV fluids in the ER since I've seen her initially and looks resuscitated enough for an endoscopy this morning.  I am recommending an EGD to further evaluate her symptoms.  I discussed risk and benefits of EGD and anesthesia with her and she wants to proceed.  Her white count is elevated, but her abdomen is extremely soft at this time on exam, I think likelihood for perforating ulcer disease is low.  Further recommendations pending EGD, will continue IV Protonix in the interim.  Potassium being repleted in the ER and per anesthesia at time of endoscopy.  Patient agreed with the plan, all questions answered.Ileene Patrick.   Brigido Mera, MD [facility]

## 2019-03-15 NOTE — Anesthesia Preprocedure Evaluation (Addendum)
Anesthesia Evaluation  Patient identified by MRN, date of birth, ID band Patient awake    Reviewed: Allergy & Precautions, NPO status , Patient's Chart, lab work & pertinent test results  Airway Mallampati: II  TM Distance: >3 FB Neck ROM: Full    Dental no notable dental hx. (+) Teeth Intact, Dental Advisory Given   Pulmonary neg pulmonary ROS, Current Smoker,    Pulmonary exam normal breath sounds clear to auscultation       Cardiovascular negative cardio ROS Normal cardiovascular exam Rhythm:Regular Rate:Normal     Neuro/Psych negative neurological ROS  negative psych ROS   GI/Hepatic GERD  ,(+)     substance abuse  alcohol use,   Endo/Other  K 2.4 --> 2.6 after 75mEq K, gave additional 35mEq K, recheck 2.9   Renal/GU negative Renal ROS  negative genitourinary   Musculoskeletal negative musculoskeletal ROS (+)   Abdominal   Peds  Hematology  (+) Blood dyscrasia (Hgb 10.6), anemia ,   Anesthesia Other Findings EGD for UGIB  Reproductive/Obstetrics                          Anesthesia Physical Anesthesia Plan  ASA: III and emergent  Anesthesia Plan: General   Post-op Pain Management:    Induction: Intravenous and Rapid sequence  PONV Risk Score and Plan: 2 and Treatment may vary due to age or medical condition, Ondansetron and Dexamethasone  Airway Management Planned: Oral ETT  Additional Equipment:   Intra-op Plan:   Post-operative Plan: Extubation in OR and Possible Post-op intubation/ventilation  Informed Consent: I have reviewed the patients History and Physical, chart, labs and discussed the procedure including the risks, benefits and alternatives for the proposed anesthesia with the patient or authorized representative who has indicated his/her understanding and acceptance.     Dental advisory given  Plan Discussed with: CRNA  Anesthesia Plan Comments:        Anesthesia Quick Evaluation

## 2019-03-15 NOTE — Progress Notes (Addendum)
Emergent transfusion of 1 unit of blood initiated at 1228. 98.4 F axillary, 98 bpm, 83/56 18 rr. Stop time 1258. Second unit initiated at 1300, 98.3 F axillary, 107 bpm, 117/67, 22rr. Units verified w/ Leonie Man RN  Pt transferred to IR w/ second unit infusing.

## 2019-03-15 NOTE — Interval H&P Note (Signed)
History and Physical Interval Note:  03/15/2019 9:59 AM  Ashley [name]  has presented today for surgery, with the diagnosis of upper gi bleed.  The various methods of treatment have been discussed with the patient and family. After consideration of risks, benefits and other options for treatment, the patient has consented to  Procedure(s): ESOPHAGOGASTRODUODENOSCOPY (EGD) WITH PROPOFOL (N/A) as a surgical intervention.  The patient's history has been reviewed, patient examined, no change in status, stable for surgery.  I have reviewed the patient's chart and labs.  Questions were answered to the patient's satisfaction.     [place]

## 2019-03-15 NOTE — Sedation Documentation (Signed)
Emergency PRBC unit [id number] hung 999cc/hr.

## 2019-03-15 NOTE — H&P (Signed)
Chief Complaint: GI bleed  Referring Physician(s): Armbruster  Supervising Physician: Ruel Favors  Patient Status: [facility] - In-pt  History of Present Illness: Ashley [name] is a 53 y.o. female with life threatening GI bleeding.  We are asked to perform GDA embolization.  She is currently in the ICU with hypotension. She is intubated and sedated.  All history was taken from chart review and discussion with Dr. Adela Lank.  Per chart, she has had symptoms of intermittent nausea and vomiting for several months.    She has also had postprandial abdominal pain, loss of appetite, and several pounds of weight loss over the past few months and has been seen in the ED a few times.   Workup included CT scan of her abdomen and pelvis on June 30 which did not reveal any significant pathology.    She has been using some occasional NSAIDs for pain.    She has noticed 2 days of dark/tarry stools which is new for her.   In the emergency department she vomited a large amount of red blood .  She underwent endoscopy this morning by Dr. Memory Argue and he saw significant ulcer disease and fresh blood in the stomach.  She is intubated to protect her airway.   Past Medical History:  Diagnosis Date  . GERD (gastroesophageal reflux disease)     Past Surgical History:  Procedure Laterality Date  . TUBAL LIGATION      Allergies: Patient has no known allergies.  Medications: Prior to Admission medications   Medication Sig Start Date End Date Taking? Authorizing Provider  aspirin-acetaminophen-caffeine (EXCEDRIN MIGRAINE) [phone] MG tablet Take 1 tablet by mouth every 6 (six) hours as needed for headache.   Yes [provider]  dicyclomine (BENTYL) 10 MG capsule Take 1 capsule (10 mg total) by mouth every 6 (six) hours as needed for spasms. Patient not taking: Reported on 03/15/2019 02/25/19   Willy Eddy, MD  HYDROcodone-acetaminophen (NORCO/VICODIN) 5-325 MG tablet  Take 1 tablet by mouth every 4 (four) hours as needed for severe pain. Patient not taking: Reported on 03/15/2019 01/02/19   Ihor Austin, MD  nicotine (NICODERM CQ - DOSED IN MG/24 HOURS) 14 mg/24hr patch Place 1 patch (14 mg total) onto the skin daily. Patient not taking: Reported on 03/15/2019 01/03/19   Ihor Austin, MD  ondansetron (ZOFRAN) 4 MG tablet Take 1 tablet (4 mg total) by mouth every 6 (six) hours as needed for nausea. Patient not taking: Reported on 03/15/2019 01/02/19   Ihor Austin, MD  pantoprazole (PROTONIX) 40 MG tablet Take 1 tablet (40 mg total) by mouth daily. Patient not taking: Reported on 03/15/2019 01/03/19 02/02/19  Ihor Austin, MD  promethazine (PHENERGAN) 12.5 MG tablet Take 1 tablet (12.5 mg total) by mouth every 6 (six) hours as needed. Patient not taking: Reported on 03/15/2019 02/25/19   Willy Eddy, MD     History reviewed. No pertinent family history.  Social History   Socioeconomic History  . Marital status: Divorced    Spouse name: Not on file  . Number of children: Not on file  . Years of education: Not on file  . Highest education level: Not on file  Occupational History  . Not on file  Social Needs  . Financial resource strain: Not on file  . Food insecurity    Worry: Not on file    Inability: Not on file  . Transportation needs    Medical: Not on file    Non-medical:  Not on file  Tobacco Use  . Smoking status: Current Every Day Smoker    Types: Cigarettes  . Smokeless tobacco: Never Used  Substance and Sexual Activity  . Alcohol use: No  . Drug use: No  . Sexual activity: Not on file  Lifestyle  . Physical activity    Days per week: Not on file    Minutes per session: Not on file  . Stress: Not on file  Relationships  . Social Herbalist on phone: Not on file    Gets together: Not on file    Attends religious service: Not on file    Active member of club or organization: Not on file    Attends meetings of clubs or  organizations: Not on file    Relationship status: Not on file  Other Topics Concern  . Not on file  Social History Narrative  . Not on file     Review of Systems  Unable to perform ROS: Intubated    Vital Signs: BP (!) 83/56   Pulse (!) 102   Temp 98.6 F (37 C) (Temporal)   Resp (!) 21   Ht 5\' 4"  (1.626 m)   Wt 63.5 kg   SpO2 100%   BMI 24.03 kg/m   Physical Exam Vitals signs reviewed.  Constitutional:      Comments: Intubated, sedated  Cardiovascular:     Rate and Rhythm: Tachycardia present.  Pulmonary:     Breath sounds: Normal breath sounds.     Comments: On vent Abdominal:     General: There is no distension.     Palpations: Abdomen is soft.  Neurological:     Comments: Sedated  Psychiatric:     Comments: Unable to assess     Imaging: US Abdomen Limited Ruq  Result Date: 02/25/2019 CLINICAL DATA:  Right upper quadrant pain EXAM: ULTRASOUND ABDOMEN LIMITED RIGHT UPPER QUADRANT COMPARISON:  July 13, 2018 FINDINGS: Gallbladder: No gallstones or wall thickening visualized. There is no pericholecystic fluid. No sonographic Murphy sign noted by sonographer. Common bile duct: Diameter: 6 mm. No intrahepatic or extrahepatic biliary duct dilatation Liver: There is a cyst in the right lobe of the liver measuring 1.4 x 1.0 x 1.1 cm. No other focal liver lesions are evident. Within normal limits in parenchymal echogenicity. Portal vein is patent on color Doppler imaging with normal direction of blood flow towards the liver. Other: None. IMPRESSION: Small cyst in right lobe of liver.  Study otherwise unremarkable. Electronically Signed   By: Lowella Grip III M.D.   On: 02/25/2019 11:17    Labs:  CBC: Recent Labs    12/30/18 1703 12/31/18 0325 02/25/19 0815 03/15/19 0524 03/15/19 0919 03/15/19 1014  WBC 17.1* 11.1* 9.7 24.1*  --   --   HGB 12.0 9.1* 12.2 10.6* 8.8* 7.1*  HCT 37.1 28.5* 38.6 32.1* 26.0* 21.0*  PLT 407* 275 337 501*  --   --      COAGS: Recent Labs    03/15/19 0524  INR 1.1    BMP: Recent Labs    12/30/18 2217 12/31/18 0325 02/25/19 0815 03/15/19 0524 03/15/19 0919 03/15/19 1014  NA 139 142 141 134* 140 139  K 3.9 4.0 3.0* 2.4* 2.6* 2.9*  CL 109 114* 108 103  --   --   CO2 20* 21* 22 17*  --   --   GLUCOSE 112* 98 116* 131* 85 144*  BUN 46* 37* 16 15  --   --  CALCIUM 8.4* 8.4* 9.2 8.3*  --   --   CREATININE 1.10* 0.83 0.48 0.84  --   --   GFRNONAA 58* >60 >60 >60  --   --   GFRAA >60 >60 >60 >60  --   --     LIVER FUNCTION TESTS: Recent Labs    07/13/18 1031 12/30/18 1703 02/25/19 0815 03/15/19 0524  BILITOT 0.7 0.5 0.4 0.4  AST 16 15 15 22   ALT 17 12 11 13   ALKPHOS 109 106 112 128*  PROT 6.8 7.3 7.4 6.8  ALBUMIN 4.2 4.5 4.0 3.5    TUMOR MARKERS: No results for input(s): AFPTM, CEA, CA199, CHROMGRNA in the last 8760 hours.  Assessment and Plan:  Life threatening GI bleeding secondary to ulcers.  Will proceed with emergent mesenteric angiography and embolization of the gastro-duodenal artery by Dr. Miles CostainShick.  The Risks and benefits of embolization were discussed with the patient including, but not limited to bleeding, infection, vascular injury, post operative pain, or contrast induced renal failure.  This procedure involves the use of X-rays and because of the nature of the planned procedure, it is possible that we will have prolonged use of X-ray fluoroscopy.  Potential radiation risks to you include (but are not limited to) the following: - A slightly elevated risk for cancer several years later in life. This risk is typically less than 0.5% percent. This risk is low in comparison to the normal incidence of human cancer, which is 33% for women and 50% for men according to the American Cancer Society. - Radiation induced injury can include skin redness, resembling a rash, tissue breakdown / ulcers and hair loss (which can be temporary or permanent).  The likelihood of either of  these occurring depends on the difficulty of the procedure and whether you are sensitive to radiation due to previous procedures, disease, or genetic conditions.  IF your procedure requires a prolonged use of radiation, you will be notified and given written instructions for further action. It is your responsibility to monitor the irradiated area for the 2 weeks following the procedure and to notify your physician if you are concerned that you have suffered a radiation induced injury.   *Since this is an emergent procedure and family could not be reached, emergent consent is signed by both Dr. Miles CostainShick and Dr. Adela LankArmbruster and witnessed by me and placed in the chart.  Electronically Signed: Gwynneth MacleodWENDY S Wheeler [employer], PA-C   03/15/2019, 12:34 PM      I spent a total of 40 Minutes in face to face in clinical consultation, greater than 50% of which was counseling/coordinating care for embolization for GI bleed.

## 2019-03-15 NOTE — H&P (Signed)
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 difficile was negative  Returns to hospital with severe abdominal pain-awoke zero 309/13 bright red blood per rectum and then progressing to dark tarry stool-admits to Excedrin Migraine for the past several days in addition to possible nonsteroidals-has not eaten since then still has a headache that hurting her  Quite distraught in the emergency room-tearful  Review of Systems:   Pertinent +'s: Anxious, tachycardic, distraught- Pertinent -"s: No chest pain, although did have some epigastric pain-no rash, no unilateral weakness, no falls  ED Course: Attempted to give runs of potassium but was having pain therefore changed to saline with K-Protonix GTT started by EDP, Reglan 10 given, fentanyl 25 mcg given, 2 large-bore IVs placed, INR and magnesium pending  Coronavirus testing pending   Past Medical History:  Diagnosis Date  . GERD (gastroesophageal reflux disease)    Past Surgical History:  Procedure Laterality Date  . TUBAL LIGATION      reports that she has been smoking cigarettes. She has never used smokeless tobacco. She reports that she does not drink alcohol or use drugs.  Mobility: Independent  No Known Allergies  No family history on file. Ovarian cancer in mother, colon cancer in father, ovarian cancer in sister  Prior to Admission medications   Medication Sig Start Date End Date Taking? Authorizing Provider  dicyclomine (BENTYL) 10 MG capsule Take 1 capsule (10 mg total) by mouth every 6 (six) hours as needed for spasms. 02/25/19   Willy Eddyobinson, Patrick, MD  HYDROcodone-acetaminophen (NORCO/VICODIN) 5-325 MG tablet Take 1 tablet by mouth every 4 (four) hours as needed for severe pain. 01/02/19   Pyreddy, Vivien RotaPavan, MD  nicotine (NICODERM CQ - DOSED IN MG/24 HOURS) 14 mg/24hr patch Place 1 patch (14 mg total) onto the skin daily. 01/03/19   Ihor AustinPyreddy, Pavan, MD  ondansetron (ZOFRAN) 4 MG tablet Take 1 tablet (4 mg total) by mouth every 6 (six) hours as needed for nausea. 01/02/19   Ihor AustinPyreddy, Pavan, MD  pantoprazole (PROTONIX) 40 MG tablet Take 1 tablet (40 mg total) by mouth daily. 01/03/19 02/02/19  Ihor AustinPyreddy, Pavan, MD  promethazine (PHENERGAN) 12.5 MG tablet Take 1 tablet (12.5 mg total) by mouth every 6 (six) hours as needed. 02/25/19   Willy Eddyobinson, Patrick, MD    Physical Exam:  Vitals:   03/15/19 0630 03/15/19 0700  BP: 100/71 109/81  Pulse: (!) 139 (!) 127  Resp: (!) 22 17  Temp:    SpO2: 98% 97%     EOMI NCAT very anxious Caucasian female  Poor dentition Mallampati 1  No JVD  Chest clinically clear no added sound  S1-S2 tachycardic  Abdomen tender in epigastrium  Prior belly ring  No lower extremity edema  No focal deficit  Psych-anxious  I have personally reviewed following labs and imaging studies  Labs:  Sodium 134 potassium 2.3 BUN creatinine 15/0.8 about usual baseline  CO2 down from 22-17  White count 24 without focal source-hemoglobin 10.6 baseline is usually in the 12 range  Imaging studies:   No abdominal imaging performed  Medical tests:   EKG independently reviewed: Sinus tach-PR interval 0.08 QRS axis 75, no ST-T wave depressions  Test discussed with performing  physician:  No  Decision to obtain old records:   Yes  Review and summation of old records:   Yes  Active Problems:   * No active hospital problems. *   Assessment/Plan Probable upper GI bleed with likely hypovolemic shock BRB per rectum, no melena-two-point drop hemoglobin-Protonix GTT, stepdown admit, saline, Follow orthostatics, treat tachycardia as below Sinus tach DDX anxiety + hypovolemia Patient quite distraught -Ativan x1 given-stop fentanyl Chronic pain? Did not get to inquire why she takes oxycodone-no opiates Prior history IBS Continue Bentyl if able to tolerate p.o. Prior heavy EtOH Monitor mentation Metabolic acidosis Potentially from loss of alkali in stool?  Repeat labs a.m. Leukocytosis DDX considered includes diverticulosis/-itis-however she is tender in epigastrium she may have a esophagitis as well-defer to GI-repeat labs a.m. would not cover at this time with antibiotics if she spikes fever however will get blood culture asked to     Severity of Illness: The appropriate patient status for this patient is INPATIENT. Inpatient status is judged to be reasonable and necessary in order to provide the required intensity of service to ensure the patient's safety. The patient's presenting symptoms, physical exam findings, and initial radiographic and laboratory data in the context of their chronic comorbidities is felt to place them at high risk for further clinical deterioration. Furthermore, it is not anticipated that the patient will be medically stable for discharge from the hospital within 2 midnights of admission. The following factors support the patient status of inpatient.   " The patient's presenting symptoms include GI bleed. " The worrisome physical exam findings include abdominal pain. " The initial radiographic and laboratory data are worrisome because of white count elevated metabolic acidosis. " The chronic co-morbidities include multiple.   *  I certify that at the point of admission it is my clinical judgment that the patient will require inpatient hospital care spanning beyond 2 midnights from the point of admission [name] to high intensity of service, high risk for further deterioration and high frequency of surveillance required.*  DVT prophylaxis: SCD Code Status: Presumed full Family Communication: None Consults called: GI called by ED  Time spent: 65 minutes  Verlon Au, MD [days-call my NP partners at night for Care related issues] [facility] --[facility]ia [employer] [facility] , [email or web address]; password [facility]  03/15/2019, 7:29 AM

## 2019-03-15 NOTE — Progress Notes (Signed)
eLink Physician-Brief Progress Note Patient Name: Ashley [name] DOB: September 30, 1965 MRN: [id number]   Date of Service  03/15/2019  HPI/Events of Note  Multiple issues: 1. Hypotension - BP = 77/58 with MAP = 63. Norepinephrine IV infusion restarted. No CVL or CVP available and 2. K+ = 3.3 and Creatinine = 0.64.   eICU Interventions  Will order: 1. Bolus with 0.9 NaCl 1 liter IV over 1 hour now.  2. Replace K+.      Intervention Category Major Interventions: Hypotension - evaluation and management  Avaiyah Strubel Eugene 03/15/2019, 9:21 PM

## 2019-03-15 NOTE — Sedation Documentation (Signed)
Second liter of NS hung 999cc/hr.

## 2019-03-16 ENCOUNTER — Inpatient Hospital Stay (HOSPITAL_COMMUNITY): Payer: Self-pay

## 2019-03-16 ENCOUNTER — Encounter (HOSPITAL_COMMUNITY): Payer: Self-pay | Admitting: Gastroenterology

## 2019-03-16 DIAGNOSIS — K269 Duodenal ulcer, unspecified as acute or chronic, without hemorrhage or perforation: Secondary | ICD-10-CM

## 2019-03-16 DIAGNOSIS — R109 Unspecified abdominal pain: Secondary | ICD-10-CM

## 2019-03-16 DIAGNOSIS — E875 Hyperkalemia: Secondary | ICD-10-CM

## 2019-03-16 DIAGNOSIS — K279 Peptic ulcer, site unspecified, unspecified as acute or chronic, without hemorrhage or perforation: Secondary | ICD-10-CM

## 2019-03-16 LAB — BPAM RBC
Blood Product Expiration Date: 202010102359
Blood Product Expiration Date: 202010112359
Blood Product Expiration Date: 202010112359
Blood Product Expiration Date: 202010112359
Blood Product Expiration Date: 202010112359
Blood Product Expiration Date: 202010112359
Blood Product Expiration Date: 202010112359
Blood Product Expiration Date: 202010142359
Blood Product Expiration Date: 202010152359
Blood Product Expiration Date: 202010152359
Blood Product Expiration Date: 202010152359
Blood Product Expiration Date: 202010162359
ISSUE DATE / TIME: 202009131022
ISSUE DATE / TIME: 202009131224
ISSUE DATE / TIME: 202009131224
ISSUE DATE / TIME: 202009131336
ISSUE DATE / TIME: 202009131336
ISSUE DATE / TIME: 202009131336
ISSUE DATE / TIME: 202009131336
ISSUE DATE / TIME: 202009131716
Unit Type and Rh: 5100
Unit Type and Rh: 5100
Unit Type and Rh: 5100
Unit Type and Rh: 5100
Unit Type and Rh: 6200
Unit Type and Rh: 6200
Unit Type and Rh: 6200
Unit Type and Rh: 6200
Unit Type and Rh: 6200
Unit Type and Rh: 6200
Unit Type and Rh: 6200
Unit Type and Rh: 6200

## 2019-03-16 LAB — COMPREHENSIVE METABOLIC PANEL
ALT: 20 U/L (ref 0–44)
AST: 27 U/L (ref 15–41)
Albumin: 2.2 g/dL — ABNORMAL LOW (ref 3.5–5.0)
Alkaline Phosphatase: 58 U/L (ref 38–126)
Anion gap: 8 (ref 5–15)
BUN: 10 mg/dL (ref 6–20)
CO2: 16 mmol/L — ABNORMAL LOW (ref 22–32)
Calcium: 6.9 mg/dL — ABNORMAL LOW (ref 8.9–10.3)
Chloride: 119 mmol/L — ABNORMAL HIGH (ref 98–111)
Creatinine, Ser: 0.71 mg/dL (ref 0.44–1.00)
GFR calc Af Amer: >60 mL/min (ref >60)
GFR calc non Af Amer: >60 mL/min (ref >60)
Glucose, Bld: 199 mg/dL — ABNORMAL HIGH (ref 70–99)
Potassium: 5.6 mmol/L — ABNORMAL HIGH (ref 3.5–5.1)
Sodium: 143 mmol/L (ref 135–145)
Total Bilirubin: 0.9 mg/dL (ref 0.3–1.2)
Total Protein: 3.8 g/dL — ABNORMAL LOW (ref 6.5–8.1)

## 2019-03-16 LAB — TYPE AND SCREEN
ABO/RH(D): A POS
Antibody Screen: NEGATIVE
Unit division: 0
Unit division: 0
Unit division: 0
Unit division: 0
Unit division: 0
Unit division: 0
Unit division: 0
Unit division: 0
Unit division: 0
Unit division: 0
Unit division: 0
Unit division: 0

## 2019-03-16 LAB — PROTIME-INR
INR: 1 (ref 0.8–1.2)
Prothrombin Time: 12.9 seconds (ref 11.4–15.2)

## 2019-03-16 LAB — CBC
HCT: 46.1 % — ABNORMAL HIGH (ref 36.0–46.0)
HCT: 43.1 % (ref 36.0–46.0)
Hemoglobin: 15.4 g/dL — ABNORMAL HIGH (ref 12.0–15.0)
Hemoglobin: 14.5 g/dL (ref 12.0–15.0)
MCH: 30.7 pg (ref 26.0–34.0)
MCH: 30.3 pg (ref 26.0–34.0)
MCHC: 33.4 g/dL (ref 30.0–36.0)
MCHC: 33.6 g/dL (ref 30.0–36.0)
MCV: 91.8 fL (ref 80.0–100.0)
MCV: 90.2 fL (ref 80.0–100.0)
Platelets: 129 10*3/uL — ABNORMAL LOW (ref 150–400)
Platelets: 164 10*3/uL (ref 150–400)
RBC: 5.02 MIL/uL (ref 3.87–5.11)
RBC: 4.78 MIL/uL (ref 3.87–5.11)
RDW: 15.5 % (ref 11.5–15.5)
RDW: 15.8 % — ABNORMAL HIGH (ref 11.5–15.5)
WBC: 16.9 10*3/uL — ABNORMAL HIGH (ref 4.0–10.5)
WBC: 18.9 10*3/uL — ABNORMAL HIGH (ref 4.0–10.5)
nRBC: 0 % (ref 0.0–0.2)
nRBC: 0 % (ref 0.0–0.2)

## 2019-03-16 LAB — GLUCOSE, CAPILLARY
Glucose-Capillary: 102 mg/dL — ABNORMAL HIGH (ref 70–99)
Glucose-Capillary: 113 mg/dL — ABNORMAL HIGH (ref 70–99)
Glucose-Capillary: 149 mg/dL — ABNORMAL HIGH (ref 70–99)
Glucose-Capillary: 155 mg/dL — ABNORMAL HIGH (ref 70–99)
Glucose-Capillary: 86 mg/dL (ref 70–99)
Glucose-Capillary: 97 mg/dL (ref 70–99)

## 2019-03-16 LAB — POTASSIUM: Potassium: 4.4 mmol/L (ref 3.5–5.1)

## 2019-03-16 LAB — HEMOGLOBIN
Hemoglobin: 15.6 g/dL — ABNORMAL HIGH (ref 12.0–15.0)
Hemoglobin: 14 g/dL (ref 12.0–15.0)

## 2019-03-16 LAB — PREPARE RBC (CROSSMATCH)

## 2019-03-16 MED ORDER — HYDROMORPHONE HCL 1 MG/ML IJ SOLN
0.5000 mg | INTRAMUSCULAR | Status: DC | PRN
  Administered 2019-03-16 – 2019-03-17 (×6): 0.5 mg via INTRAVENOUS
  Filled 2019-03-16 (×6): qty 1

## 2019-03-16 MED ORDER — KCL IN DEXTROSE-NACL 40-5-0.9 MEQ/L-%-% IV SOLN
INTRAVENOUS | Status: DC
  Administered 2019-03-16: 07:00:00 via INTRAVENOUS
  Filled 2019-03-16: qty 1000

## 2019-03-16 MED ORDER — PROMETHAZINE HCL 25 MG/ML IJ SOLN
12.5000 mg | Freq: Three times a day (TID) | INTRAMUSCULAR | Status: DC | PRN
  Administered 2019-03-17 – 2019-03-20 (×4): 12.5 mg via INTRAVENOUS
  Filled 2019-03-16 (×5): qty 1

## 2019-03-16 MED ORDER — SODIUM CHLORIDE 0.9 % IV SOLN
8.0000 mg | Freq: Three times a day (TID) | INTRAVENOUS | Status: DC | PRN
  Administered 2019-03-16 – 2019-03-20 (×5): 8 mg via INTRAVENOUS
  Filled 2019-03-16 (×6): qty 4

## 2019-03-16 NOTE — Progress Notes (Signed)
Initial Nutrition Assessment  DOCUMENTATION CODES:   Not applicable  INTERVENTION:  - will monitor for ability to advance diet s/p extubation vs need for nutrition support.    NUTRITION DIAGNOSIS:   Inadequate oral intake related to inability to eat as evidenced by NPO status.  GOAL:   Patient will meet greater than or equal to 90% of their needs  MONITOR:   Vent status, Labs, Weight trends  REASON FOR ASSESSMENT:   Ventilator  ASSESSMENT:   53 year old current smoker (1 PPD) with hx of GERD. She has had abdominal pain and intractable N/V since January. She presented to the ED on 9/13 d/t abdominal pain/cramping associated with melena x2-3 days and new onset of hematemesis x1 day. Hematemesis began prior to admission and had an episode in the ED with bright red blood. GI was consulted and she was intubated for EGD. EGD with blood clots but no focal area of bleeding per GI. Post-procedure, patient transferred from endoscopy to ICU for concerns of ongoing bleed and need to remain intubated.  Patient remains intubated; no OGT/NGT in place at this time. Spoke with RN outside of patient's room. Plan for r/o of perforation. No family/visitors at bedside at this time.  Per chart review, current weight is 140 lb and weight on 7/1 was 142 lb. Weight on 07/13/18 was 155 lb which indicates 15 lb weight loss (10% body weight) in the past 8 months; not significant for time frame.  Per notes: - acute respiratory insufficiency--SBTs today and if she tolerates then possible extubation - UGIB s/p embolization of gastroduodenal artery - acute blood loss anemia--PRBCs x9 - hyperkalemia - GI following and noted patient writing in pain and indicated epigastric pain during her assessment   Patient is currently intubated on ventilator support MV: 5.6 L/min Temp (24hrs), Avg:97.9 F (36.6 C), Min:96.2 F (35.7 C), Max:98.9 F (37.2 C) Propofol: none BP: 166/87  Labs reviewed; CBGs: 149 and  155 mg/dl, K: 5.6 mmol/l, Cl: 119 mmol/l, Ca: 6.9 mg/dl. Medications reviewed; sliding scale novolog, 8mg /hr protonix, 10 mEq IV KCl x4 runs 9/13. IVF; D5-NS-40 mEq KCl @ 100 ml/hr (408 kcal) Drip; fentanyl @ 200 mcg/hr.    NUTRITION - FOCUSED PHYSICAL EXAM:  completed; no muscle and no fat wasting.   Diet Order:   Diet Order            Diet NPO time specified  Diet effective now              EDUCATION NEEDS:   No education needs have been identified at this time  Skin:  Skin Assessment: Reviewed RN Assessment  Last BM:  9/14  Height:   Ht Readings from Last 1 Encounters:  03/15/19 5\' 4"  (1.626 m)    Weight:   Wt Readings from Last 1 Encounters:  03/15/19 63.5 kg    Ideal Body Weight:  54.5 kg  BMI:  Body mass index is 24.03 kg/m.  Estimated Nutritional Needs:   Kcal:  1363 kcal  Protein:  76-95 grams  Fluid:  >/= 1.8 L/day     Jarome Matin, MS, RD, LDN, [facility] Inpatient Clinical Dietitian Pager # [phone] After hours/weekend pager # [phone]

## 2019-03-16 NOTE — Progress Notes (Addendum)
Patient ID: Ashley [name], female   DOB: [birth year], 53 y.o.   MRN: [id number]    Progress Note   Subjective   Day # 2 CC ; GI bleed,   EGD 9/13- multiple cleanbased gastric ulcers, large cratered pyloric ulcer with clot, obliterating lumen of bulb  Transfused x 2 during night  Emergency IR intervention last  night - GDFA angio /embolization - bleeding duodenal ulcer  Last hgb 15 !- was 7.1 yesterday am  Awake on vent currently - writhing in pain -c/o epigastric pain , nursing says new in past hour- maxed on fentanyl   Objective   Vital signs in last 24 hours: Temp:  [96.2 F (35.7 C)-98.9 F (37.2 C)] 98.1 F (36.7 C) (09/14 0800) Pulse Rate:  [54-145] 84 (09/14 0800) Resp:  [13-33] 14 (09/14 0800) BP: (52-171)/(16-139) 149/89 (09/14 0800) SpO2:  [98 %-100 %] 98 % (09/14 0800) FiO2 (%):  [40 %-100 %] 40 % (09/14 0800)   General:    white female alert on vent - in pain Heart: tachy Regular rate and rhythm; no murmurs Lungs: Respirations even and unlabored, lungs CTA bilaterally Abdomen:  Soft, tender epigastrium and nondistended. BS quiet . Extremities:  Without edema. Neurologic:  Alert   grossly normal neurologically. Psych:  Cooperative. Normal mood and affect.  Intake/Output from previous day: 09/13 0701 - 09/14 0700 In: 6917.4 [I.V.:4362.1; Blood:645; IV Piggyback:1910.3] Out: 925 [Urine:625; Blood:300] Intake/Output this shift: Total I/O In: 146.1 [I.V.:146.1] Out: 125 [Urine:125]  Lab Results: Recent Labs    03/15/19 0524  03/15/19 1014 03/15/19 2008 03/16/19 0302  WBC 24.1*  --   --  12.5* 16.9*  HGB 10.6*   < > 7.1* 16.3* 15.4*  HCT 32.1*   < > 21.0* 48.3* 46.1*  PLT 501*  --   --  103* 129*   < > = values in this interval not displayed.   BMET Recent Labs    03/15/19 0524  03/15/19 1014 03/15/19 2008 03/16/19 0302  NA 134*   < > 139 143 143  K 2.4*   < > 2.9* 3.3* 5.6*  CL 103  --   --  118* 119*  CO2 17*  --   --  18* 16*  GLUCOSE 131*    < > 144* 145* 199*  BUN 15  --   --  12 10  CREATININE 0.84  --   --  0.64 0.71  CALCIUM 8.3*  --   --  7.1* 6.9*   < > = values in this interval not displayed.   LFT Recent Labs    03/16/19 0302  PROT 3.8*  ALBUMIN 2.2*  AST 27  ALT 20  ALKPHOS 58  BILITOT 0.9   PT/INR Recent Labs    03/15/19 0524  LABPROT 14.3  INR 1.1    Studies/Results: Ir Angiogram Visceral Selective  Result Date: 03/15/2019 INDICATION: Acutely bleeding duodenal ulcer, anemia requiring transfusions, becoming hemodynamically unstable EXAM: Ultrasound guidance for vascular access Celiac, common hepatic, and GDA catheterizations and angiograms Successful GDA micro catheterization and coil embolization for active bleeding MEDICATIONS: 1% lidocaine local. ANESTHESIA/SEDATION: Patient is hemodynamically unstable. Requiring blood transfusion and pressor support. She is intubated. Moderate Sedation Time: None. The patient's level of consciousness and vital signs were monitored continuously by radiology nursing throughout the procedure under my direct supervision. CONTRAST:  100 cc Omnipaque 300 FLUOROSCOPY TIME:  Fluoroscopy Time: 23 minutes 48 seconds (1,560 mGy). COMPLICATIONS: None immediate. PROCEDURE: Family is unavailable. Patient is  intubated, acutely ill and unable to consent. Emergent consent was obtained from Dr Adela LankArmbruster and Miles CostainShick. A time out was performed prior to the initiation of the procedure. Maximal barrier sterile technique utilized including caps, mask, sterile gowns, sterile gloves, large sterile drape, hand hygiene, and Betadine prep. Under sterile conditions and local anesthesia, ultrasound micropuncture access performed of the right common femoral artery. Images obtained for documentation of the patent right common femoral artery. Five French sheath inserted. C2 catheter utilized to select the celiac origin. Celiac angiogram performed. Celiac: Celiac origin and its branches demonstrate diffuse small  caliber vascular constriction related to hemodynamic instability. The celiac origin, splenic, left gastric, and hepatic vasculature all patent. Small caliber GDA and gastroepiploic artery noted. Stomach is markedly distended by fluoroscopy. C2 catheter advanced into the common hepatic artery. Common hepatic angiogram performed. Common hepatic: Common hepatic, proper, left and right hepatic vasculature are patent and diffusely constricted. Small caliber GDA however there is irregularity of the mid to distal GDA and caliber change suspicious for an area of vascular bleeding. Distal to this the gastroepiploic artery is patent. Through the C2 catheter, a Renegade STC catheter was eventually advanced over a single angle GT Glidewire into the GDA. GDA angiogram: Initial contrast injection demonstrates extravasation outside the vasculature into the duodenal lumen. GDA micro coil embolization: At this location of active bleeding, 2 mm interlock coils were initially deployed at the GDA bleeding site. Over the guidewire, the microcatheter was advanced peripheral to the bleeding site. Contrast injection confirms peripheral position in the GDA below the active bleeding and preserved patency of the gastroepiploic artery. Complete back coil embolization was performed of the entire GDA with 3 and 4 mm interlock coils ranging in length from 6-12 cm. Following embolization, repeat GDA angiogram confirms complete occlusion. No further active bleeding. Microcatheter removed. Repeat common hepatic angiogram demonstrates no further active bleeding and coil occlusion of the GDA. Access removed. Hemostasis obtained with the ExoSeal device. No immediate complication. Patient tolerated the procedure well. IMPRESSION: Positive angiogram for active upper GI bleeding along the mid to distal GDA. Successful micro coil embolization of the entire GDA across the bleeding site to complete stasis. Electronically Signed   By: Judie PetitM.  Shick M.D.   On:  03/15/2019 15:25   Ir Angiogram Selective Each Additional Vessel  Result Date: 03/15/2019 INDICATION: Acutely bleeding duodenal ulcer, anemia requiring transfusions, becoming hemodynamically unstable EXAM: Ultrasound guidance for vascular access Celiac, common hepatic, and GDA catheterizations and angiograms Successful GDA micro catheterization and coil embolization for active bleeding MEDICATIONS: 1% lidocaine local. ANESTHESIA/SEDATION: Patient is hemodynamically unstable. Requiring blood transfusion and pressor support. She is intubated. Moderate Sedation Time: None. The patient's level of consciousness and vital signs were monitored continuously by radiology nursing throughout the procedure under my direct supervision. CONTRAST:  100 cc Omnipaque 300 FLUOROSCOPY TIME:  Fluoroscopy Time: 23 minutes 48 seconds (1,560 mGy). COMPLICATIONS: None immediate. PROCEDURE: Family is unavailable. Patient is intubated, acutely ill and unable to consent. Emergent consent was obtained from Dr Adela LankArmbruster and Miles CostainShick. A time out was performed prior to the initiation of the procedure. Maximal barrier sterile technique utilized including caps, mask, sterile gowns, sterile gloves, large sterile drape, hand hygiene, and Betadine prep. Under sterile conditions and local anesthesia, ultrasound micropuncture access performed of the right common femoral artery. Images obtained for documentation of the patent right common femoral artery. Five French sheath inserted. C2 catheter utilized to select the celiac origin. Celiac angiogram performed. Celiac: Celiac origin and its branches  demonstrate diffuse small caliber vascular constriction related to hemodynamic instability. The celiac origin, splenic, left gastric, and hepatic vasculature all patent. Small caliber GDA and gastroepiploic artery noted. Stomach is markedly distended by fluoroscopy. C2 catheter advanced into the common hepatic artery. Common hepatic angiogram performed.  Common hepatic: Common hepatic, proper, left and right hepatic vasculature are patent and diffusely constricted. Small caliber GDA however there is irregularity of the mid to distal GDA and caliber change suspicious for an area of vascular bleeding. Distal to this the gastroepiploic artery is patent. Through the C2 catheter, a Renegade STC catheter was eventually advanced over a single angle GT Glidewire into the GDA. GDA angiogram: Initial contrast injection demonstrates extravasation outside the vasculature into the duodenal lumen. GDA micro coil embolization: At this location of active bleeding, 2 mm interlock coils were initially deployed at the [place] bleeding site. Over the guidewire, the microcatheter was advanced peripheral to the bleeding site. Contrast injection confirms peripheral position in the GDA below the active bleeding and preserved patency of the gastroepiploic artery. Complete back coil embolization was performed of the entire GDA with 3 and 4 mm interlock coils ranging in length from 6-12 cm. Following embolization, repeat GDA angiogram confirms complete occlusion. No further active bleeding. Microcatheter removed. Repeat common hepatic angiogram demonstrates no further active bleeding and coil occlusion of the GDA. Access removed. Hemostasis obtained with the ExoSeal device. No immediate complication. Patient tolerated the procedure well. IMPRESSION: Positive angiogram for active upper GI bleeding along the mid to distal GDA. Successful micro coil embolization of the entire GDA across the bleeding site to complete stasis. Electronically Signed   By: Jerilynn Mages.  Shick M.D.   On: 03/15/2019 15:25   Ir Angiogram Selective Each Additional Vessel  Result Date: 03/15/2019 INDICATION: Acutely bleeding duodenal ulcer, anemia requiring transfusions, becoming hemodynamically unstable EXAM: Ultrasound guidance for vascular access Celiac, common hepatic, and GDA catheterizations and angiograms Successful GDA  micro catheterization and coil embolization for active bleeding MEDICATIONS: 1% lidocaine local. ANESTHESIA/SEDATION: Patient is hemodynamically unstable. Requiring blood transfusion and pressor support. She is intubated. Moderate Sedation Time: None. The patient's level of consciousness and vital signs were monitored continuously by radiology nursing throughout the procedure under my direct supervision. CONTRAST:  100 cc Omnipaque 300 FLUOROSCOPY TIME:  Fluoroscopy Time: 23 minutes 48 seconds (1,560 mGy). COMPLICATIONS: None immediate. PROCEDURE: Family is unavailable. Patient is intubated, acutely ill and unable to consent. Emergent consent was obtained from Dr Havery Moros and Annamaria Boots. A time out was performed prior to the initiation of the procedure. Maximal barrier sterile technique utilized including caps, mask, sterile gowns, sterile gloves, large sterile drape, hand hygiene, and Betadine prep. Under sterile conditions and local anesthesia, ultrasound micropuncture access performed of the right common femoral artery. Images obtained for documentation of the patent right common femoral artery. Five French sheath inserted. C2 catheter utilized to select the celiac origin. Celiac angiogram performed. Celiac: Celiac origin and its branches demonstrate diffuse small caliber vascular constriction related to hemodynamic instability. The celiac origin, splenic, left gastric, and hepatic vasculature all patent. Small caliber GDA and gastroepiploic artery noted. Stomach is markedly distended by fluoroscopy. C2 catheter advanced into the common hepatic artery. Common hepatic angiogram performed. Common hepatic: Common hepatic, proper, left and right hepatic vasculature are patent and diffusely constricted. Small caliber GDA however there is irregularity of the mid to distal GDA and caliber change suspicious for an area of vascular bleeding. Distal to this the gastroepiploic artery is patent. Through the C2 catheter,  a  Renegade STC catheter was eventually advanced over a single angle GT Glidewire into the GDA. GDA angiogram: Initial contrast injection demonstrates extravasation outside the vasculature into the duodenal lumen. GDA micro coil embolization: At this location of active bleeding, 2 mm interlock coils were initially deployed at the GDA bleeding site. Over the guidewire, the microcatheter was advanced peripheral to the bleeding site. Contrast injection confirms peripheral position in the GDA below the active bleeding and preserved patency of the gastroepiploic artery. Complete back coil embolization was performed of the entire GDA with 3 and 4 mm interlock coils ranging in length from 6-12 cm. Following embolization, repeat GDA angiogram confirms complete occlusion. No further active bleeding. Microcatheter removed. Repeat common hepatic angiogram demonstrates no further active bleeding and coil occlusion of the GDA. Access removed. Hemostasis obtained with the ExoSeal device. No immediate complication. Patient tolerated the procedure well. IMPRESSION: Positive angiogram for active upper GI bleeding along the mid to distal GDA. Successful micro coil embolization of the entire GDA across the bleeding site to complete stasis. Electronically Signed   By: Judie Petit.  Shick M.D.   On: 03/15/2019 15:25   Ir US Guide Vasc Access Right  Result Date: 03/15/2019 INDICATION: Acutely bleeding duodenal ulcer, anemia requiring transfusions, becoming hemodynamically unstable EXAM: Ultrasound guidance for vascular access Celiac, common hepatic, and GDA catheterizations and angiograms Successful GDA micro catheterization and coil embolization for active bleeding MEDICATIONS: 1% lidocaine local. ANESTHESIA/SEDATION: Patient is hemodynamically unstable. Requiring blood transfusion and pressor support. She is intubated. Moderate Sedation Time: None. The patient's level of consciousness and vital signs were monitored continuously by radiology  nursing throughout the procedure under my direct supervision. CONTRAST:  100 cc Omnipaque 300 FLUOROSCOPY TIME:  Fluoroscopy Time: 23 minutes 48 seconds (1,560 mGy). COMPLICATIONS: None immediate. PROCEDURE: Family is unavailable. Patient is intubated, acutely ill and unable to consent. Emergent consent was obtained from Dr Adela Lank and Miles Costain. A time out was performed prior to the initiation of the procedure. Maximal barrier sterile technique utilized including caps, mask, sterile gowns, sterile gloves, large sterile drape, hand hygiene, and Betadine prep. Under sterile conditions and local anesthesia, ultrasound micropuncture access performed of the right common femoral artery. Images obtained for documentation of the patent right common femoral artery. Five French sheath inserted. C2 catheter utilized to select the celiac origin. Celiac angiogram performed. Celiac: Celiac origin and its branches demonstrate diffuse small caliber vascular constriction related to hemodynamic instability. The celiac origin, splenic, left gastric, and hepatic vasculature all patent. Small caliber GDA and gastroepiploic artery noted. Stomach is markedly distended by fluoroscopy. C2 catheter advanced into the common hepatic artery. Common hepatic angiogram performed. Common hepatic: Common hepatic, proper, left and right hepatic vasculature are patent and diffusely constricted. Small caliber GDA however there is irregularity of the mid to distal GDA and caliber change suspicious for an area of vascular bleeding. Distal to this the gastroepiploic artery is patent. Through the C2 catheter, a Renegade STC catheter was eventually advanced over a single angle GT Glidewire into the GDA. GDA angiogram: Initial contrast injection demonstrates extravasation outside the vasculature into the duodenal lumen. GDA micro coil embolization: At this location of active bleeding, 2 mm interlock coils were initially deployed at the GDA bleeding site.  Over the guidewire, the microcatheter was advanced peripheral to the bleeding site. Contrast injection confirms peripheral position in the GDA below the active bleeding and preserved patency of the gastroepiploic artery. Complete back coil embolization was performed of the entire GDA with 3  and 4 mm interlock coils ranging in length from 6-12 cm. Following embolization, repeat GDA angiogram confirms complete occlusion. No further active bleeding. Microcatheter removed. Repeat common hepatic angiogram demonstrates no further active bleeding and coil occlusion of the GDA. Access removed. Hemostasis obtained with the ExoSeal device. No immediate complication. Patient tolerated the procedure well. IMPRESSION: Positive angiogram for active upper GI bleeding along the mid to distal GDA. Successful micro coil embolization of the entire GDA across the bleeding site to complete stasis. Electronically Signed   By: Judie Petit.  Shick M.D.   On: 03/15/2019 15:25   Dg Chest Port 1 View  Result Date: 03/15/2019 CLINICAL DATA:  Endotracheal tube placement. EXAM: PORTABLE CHEST 1 VIEW COMPARISON:  None. FINDINGS: 1143 hours. Endotracheal tube tip is 2.7 cm above the base of the carina. No pulmonary edema or pleural effusion. Small nodular opacity identified left mid lung, indeterminate. The cardiopericardial silhouette is within normal limits for size. The visualized bony structures of the thorax are intact. Telemetry leads overlie the chest. IMPRESSION: 1. Endotracheal tube tip 2.7 cm above the base of the carina. 2. Small nodular focal opacity left mid lung. Potentially infectious/inflammatory although neoplastic etiology not excluded. Follow-up chest CT recommended to further evaluate. Electronically Signed   By: Kennith Center M.D.   On: 03/15/2019 13:02   Ir Shana Chute [facility] [employer] Guide Roadmapping  Result Date: 03/15/2019 INDICATION: Acutely bleeding duodenal ulcer, anemia requiring transfusions, becoming  hemodynamically unstable EXAM: Ultrasound guidance for vascular access Celiac, common hepatic, and GDA catheterizations and angiograms Successful GDA micro catheterization and coil embolization for active bleeding MEDICATIONS: 1% lidocaine local. ANESTHESIA/SEDATION: Patient is hemodynamically unstable. Requiring blood transfusion and pressor support. She is intubated. Moderate Sedation Time: None. The patient's level of consciousness and vital signs were monitored continuously by radiology nursing throughout the procedure under my direct supervision. CONTRAST:  100 cc Omnipaque 300 FLUOROSCOPY TIME:  Fluoroscopy Time: 23 minutes 48 seconds (1,560 mGy). COMPLICATIONS: None immediate. PROCEDURE: Family is unavailable. Patient is intubated, acutely ill and unable to consent. Emergent consent was obtained from Dr Adela Lank and Miles Costain. A time out was performed prior to the initiation of the procedure. Maximal barrier sterile technique utilized including caps, mask, sterile gowns, sterile gloves, large sterile drape, hand hygiene, and Betadine prep. Under sterile conditions and local anesthesia, ultrasound micropuncture access performed of the right common femoral artery. Images obtained for documentation of the patent right common femoral artery. Five French sheath inserted. C2 catheter utilized to select the celiac origin. Celiac angiogram performed. Celiac: Celiac origin and its branches demonstrate diffuse small caliber vascular constriction related to hemodynamic instability. The celiac origin, splenic, left gastric, and hepatic vasculature all patent. Small caliber GDA and gastroepiploic artery noted. Stomach is markedly distended by fluoroscopy. C2 catheter advanced into the common hepatic artery. Common hepatic angiogram performed. Common hepatic: Common hepatic, proper, left and right hepatic vasculature are patent and diffusely constricted. Small caliber GDA however there is irregularity of the mid to distal  GDA and caliber change suspicious for an area of vascular bleeding. Distal to this the gastroepiploic artery is patent. Through the C2 catheter, a Renegade STC catheter was eventually advanced over a single angle GT Glidewire into the GDA. GDA angiogram: Initial contrast injection demonstrates extravasation outside the vasculature into the duodenal lumen. GDA micro coil embolization: At this location of active bleeding, 2 mm interlock coils were initially deployed at the GDA bleeding site. Over the guidewire, the microcatheter was advanced  peripheral to the bleeding site. Contrast injection confirms peripheral position in the GDA below the active bleeding and preserved patency of the gastroepiploic artery. Complete back coil embolization was performed of the entire GDA with 3 and 4 mm interlock coils ranging in length from 6-12 cm. Following embolization, repeat GDA angiogram confirms complete occlusion. No further active bleeding. Microcatheter removed. Repeat common hepatic angiogram demonstrates no further active bleeding and coil occlusion of the GDA. Access removed. Hemostasis obtained with the ExoSeal device. No immediate complication. Patient tolerated the procedure well. IMPRESSION: Positive angiogram for active upper GI bleeding along the mid to distal GDA. Successful micro coil embolization of the entire GDA across the bleeding site to complete stasis. Electronically Signed   By: Judie Petit.  Shick M.D.   On: 03/15/2019 15:25       Assessment / Plan:    #1 53 yo female admitted yesterday with acute upper GI bleed.  On EGD patient found to have multiple clean-based gastric ulcers and a large cratered pyloric ulcer with overlying clot obscuring lumen.  Patient had persistent active bleeding yesterday and required IR intervention with embolization of the gastroduodenal artery.  Intubated for airway protection-weaning currently Patient continues on PPI infusion. She is now complaining of severe abdominal  pain this morning apparently is new.  Rule out perforation  #2 anemia secondary to blood loss-patient has been transfused multiple units and last hemoglobin 15 #3 history of EtOH abuse  Plan; keep n.p.o., Stat abdominal films Continue PPI infusion Continue serial hemoglobins Critical care plans to wean and consider extubation later today.  Addendum - just extubated  KUB - negative , no free air    Active Problems:   Upper GI bleed     LOS: 1 day   Kimble Hitchens PA-C 03/16/2019, 9:04 AM

## 2019-03-16 NOTE — Progress Notes (Signed)
Referring Physician(s): Armbruster,S  Supervising Physician: Sandi Mariscal  Patient Status:  [facility] - In-pt  Chief Complaint:  Abdominal pain, recent bleed  Subjective: Pt still with intermittent epigastric pain, occ nausea; also anxious; reports no further visible bleeding   Allergies: Patient has no known allergies.  Medications: Prior to Admission medications   Medication Sig Start Date End Date Taking? Authorizing Provider  aspirin-acetaminophen-caffeine (EXCEDRIN MIGRAINE) [phone] MG tablet Take 1 tablet by mouth every 6 (six) hours as needed for headache.   Yes [provider]  dicyclomine (BENTYL) 10 MG capsule Take 1 capsule (10 mg total) by mouth every 6 (six) hours as needed for spasms. Patient not taking: Reported on 03/15/2019 02/25/19   Merlyn Lot, MD  HYDROcodone-acetaminophen (NORCO/VICODIN) 5-325 MG tablet Take 1 tablet by mouth every 4 (four) hours as needed for severe pain. Patient not taking: Reported on 03/15/2019 01/02/19   Saundra Shelling, MD  nicotine (NICODERM CQ - DOSED IN MG/24 HOURS) 14 mg/24hr patch Place 1 patch (14 mg total) onto the skin daily. Patient not taking: Reported on 03/15/2019 01/03/19   Saundra Shelling, MD  ondansetron (ZOFRAN) 4 MG tablet Take 1 tablet (4 mg total) by mouth every 6 (six) hours as needed for nausea. Patient not taking: Reported on 03/15/2019 01/02/19   Saundra Shelling, MD  pantoprazole (PROTONIX) 40 MG tablet Take 1 tablet (40 mg total) by mouth daily. Patient not taking: Reported on 03/15/2019 01/03/19 02/02/19  Saundra Shelling, MD  promethazine (PHENERGAN) 12.5 MG tablet Take 1 tablet (12.5 mg total) by mouth every 6 (six) hours as needed. Patient not taking: Reported on 03/15/2019 02/25/19   Merlyn Lot, MD     Vital Signs: BP (!) 168/89    Pulse (!) 104    Temp 98.4 F (36.9 C) (Oral)    Resp (!) 22    Ht 5\' 4"  (1.626 m)    Wt 140 lb (63.5 kg)    SpO2 99%    BMI 24.03 kg/m   Physical Exam awake/alert; abd  soft,+BS, tender epig region to palpation; rt CFA puncture site soft,no hematoma, NT  Imaging: Ir Angiogram Visceral Selective  Result Date: 03/15/2019 INDICATION: Acutely bleeding duodenal ulcer, anemia requiring transfusions, becoming hemodynamically unstable EXAM: Ultrasound guidance for vascular access Celiac, common hepatic, and GDA catheterizations and angiograms Successful GDA micro catheterization and coil embolization for active bleeding MEDICATIONS: 1% lidocaine local. ANESTHESIA/SEDATION: Patient is hemodynamically unstable. Requiring blood transfusion and pressor support. She is intubated. Moderate Sedation Time: None. The patient's level of consciousness and vital signs were monitored continuously by radiology nursing throughout the procedure under my direct supervision. CONTRAST:  100 cc Omnipaque 300 FLUOROSCOPY TIME:  Fluoroscopy Time: 23 minutes 48 seconds (1,560 mGy). COMPLICATIONS: None immediate. PROCEDURE: Family is unavailable. Patient is intubated, acutely ill and unable to consent. Emergent consent was obtained from Dr Havery Moros and Annamaria Boots. A time out was performed prior to the initiation of the procedure. Maximal barrier sterile technique utilized including caps, mask, sterile gowns, sterile gloves, large sterile drape, hand hygiene, and Betadine prep. Under sterile conditions and local anesthesia, ultrasound micropuncture access performed of the right common femoral artery. Images obtained for documentation of the patent right common femoral artery. Five French sheath inserted. C2 catheter utilized to select the celiac origin. Celiac angiogram performed. Celiac: Celiac origin and its branches demonstrate diffuse small caliber vascular constriction related to hemodynamic instability. The celiac origin, splenic, left gastric, and hepatic vasculature all patent. Small caliber GDA and gastroepiploic artery  noted. Stomach is markedly distended by fluoroscopy. C2 catheter advanced into the  common hepatic artery. Common hepatic angiogram performed. Common hepatic: Common hepatic, proper, left and right hepatic vasculature are patent and diffusely constricted. Small caliber GDA however there is irregularity of the mid to distal GDA and caliber change suspicious for an area of vascular bleeding. Distal to this the gastroepiploic artery is patent. Through the C2 catheter, a Renegade STC catheter was eventually advanced over a single angle GT Glidewire into the GDA. GDA angiogram: Initial contrast injection demonstrates extravasation outside the vasculature into the duodenal lumen. GDA micro coil embolization: At this location of active bleeding, 2 mm interlock coils were initially deployed at the GDA bleeding site. Over the guidewire, the microcatheter was advanced peripheral to the bleeding site. Contrast injection confirms peripheral position in the GDA below the active bleeding and preserved patency of the gastroepiploic artery. Complete back coil embolization was performed of the entire GDA with 3 and 4 mm interlock coils ranging in length from 6-12 cm. Following embolization, repeat GDA angiogram confirms complete occlusion. No further active bleeding. Microcatheter removed. Repeat common hepatic angiogram demonstrates no further active bleeding and coil occlusion of the GDA. Access removed. Hemostasis obtained with the ExoSeal device. No immediate complication. Patient tolerated the procedure well. IMPRESSION: Positive angiogram for active upper GI bleeding along the mid to distal GDA. Successful micro coil embolization of the entire GDA across the bleeding site to complete stasis. Electronically Signed   By: Judie Petit.  Shick M.D.   On: 03/15/2019 15:25   Ir Angiogram Selective Each Additional Vessel  Result Date: 03/15/2019 INDICATION: Acutely bleeding duodenal ulcer, anemia requiring transfusions, becoming hemodynamically unstable EXAM: Ultrasound guidance for vascular access Celiac, common hepatic,  and GDA catheterizations and angiograms Successful GDA micro catheterization and coil embolization for active bleeding MEDICATIONS: 1% lidocaine local. ANESTHESIA/SEDATION: Patient is hemodynamically unstable. Requiring blood transfusion and pressor support. She is intubated. Moderate Sedation Time: None. The patient's level of consciousness and vital signs were monitored continuously by radiology nursing throughout the procedure under my direct supervision. CONTRAST:  100 cc Omnipaque 300 FLUOROSCOPY TIME:  Fluoroscopy Time: 23 minutes 48 seconds (1,560 mGy). COMPLICATIONS: None immediate. PROCEDURE: Family is unavailable. Patient is intubated, acutely ill and unable to consent. Emergent consent was obtained from Dr Adela Lank and Miles Costain. A time out was performed prior to the initiation of the procedure. Maximal barrier sterile technique utilized including caps, mask, sterile gowns, sterile gloves, large sterile drape, hand hygiene, and Betadine prep. Under sterile conditions and local anesthesia, ultrasound micropuncture access performed of the right common femoral artery. Images obtained for documentation of the patent right common femoral artery. Five French sheath inserted. C2 catheter utilized to select the celiac origin. Celiac angiogram performed. Celiac: Celiac origin and its branches demonstrate diffuse small caliber vascular constriction related to hemodynamic instability. The celiac origin, splenic, left gastric, and hepatic vasculature all patent. Small caliber GDA and gastroepiploic artery noted. Stomach is markedly distended by fluoroscopy. C2 catheter advanced into the common hepatic artery. Common hepatic angiogram performed. Common hepatic: Common hepatic, proper, left and right hepatic vasculature are patent and diffusely constricted. Small caliber GDA however there is irregularity of the mid to distal GDA and caliber change suspicious for an area of vascular bleeding. Distal to this the  gastroepiploic artery is patent. Through the C2 catheter, a Renegade STC catheter was eventually advanced over a single angle GT Glidewire into the GDA. GDA angiogram: Initial contrast injection demonstrates extravasation outside the vasculature  into the duodenal lumen. GDA micro coil embolization: At this location of active bleeding, 2 mm interlock coils were initially deployed at the GDA bleeding site. Over the guidewire, the microcatheter was advanced peripheral to the bleeding site. Contrast injection confirms peripheral position in the GDA below the active bleeding and preserved patency of the gastroepiploic artery. Complete back coil embolization was performed of the entire GDA with 3 and 4 mm interlock coils ranging in length from 6-12 cm. Following embolization, repeat GDA angiogram confirms complete occlusion. No further active bleeding. Microcatheter removed. Repeat common hepatic angiogram demonstrates no further active bleeding and coil occlusion of the GDA. Access removed. Hemostasis obtained with the ExoSeal device. No immediate complication. Patient tolerated the procedure well. IMPRESSION: Positive angiogram for active upper GI bleeding along the mid to distal GDA. Successful micro coil embolization of the entire GDA across the bleeding site to complete stasis. Electronically Signed   By: Judie PetitM.  Shick M.D.   On: 03/15/2019 15:25   Ir Angiogram Selective Each Additional Vessel  Result Date: 03/15/2019 INDICATION: Acutely bleeding duodenal ulcer, anemia requiring transfusions, becoming hemodynamically unstable EXAM: Ultrasound guidance for vascular access Celiac, common hepatic, and GDA catheterizations and angiograms Successful GDA micro catheterization and coil embolization for active bleeding MEDICATIONS: 1% lidocaine local. ANESTHESIA/SEDATION: Patient is hemodynamically unstable. Requiring blood transfusion and pressor support. She is intubated. Moderate Sedation Time: None. The patient's level  of consciousness and vital signs were monitored continuously by radiology nursing throughout the procedure under my direct supervision. CONTRAST:  100 cc Omnipaque 300 FLUOROSCOPY TIME:  Fluoroscopy Time: 23 minutes 48 seconds (1,560 mGy). COMPLICATIONS: None immediate. PROCEDURE: Family is unavailable. Patient is intubated, acutely ill and unable to consent. Emergent consent was obtained from Dr Adela LankArmbruster and Miles CostainShick. A time out was performed prior to the initiation of the procedure. Maximal barrier sterile technique utilized including caps, mask, sterile gowns, sterile gloves, large sterile drape, hand hygiene, and Betadine prep. Under sterile conditions and local anesthesia, ultrasound micropuncture access performed of the right common femoral artery. Images obtained for documentation of the patent right common femoral artery. Five French sheath inserted. C2 catheter utilized to select the celiac origin. Celiac angiogram performed. Celiac: Celiac origin and its branches demonstrate diffuse small caliber vascular constriction related to hemodynamic instability. The celiac origin, splenic, left gastric, and hepatic vasculature all patent. Small caliber GDA and gastroepiploic artery noted. Stomach is markedly distended by fluoroscopy. C2 catheter advanced into the common hepatic artery. Common hepatic angiogram performed. Common hepatic: Common hepatic, proper, left and right hepatic vasculature are patent and diffusely constricted. Small caliber GDA however there is irregularity of the mid to distal GDA and caliber change suspicious for an area of vascular bleeding. Distal to this the gastroepiploic artery is patent. Through the C2 catheter, a Renegade STC catheter was eventually advanced over a single angle GT Glidewire into the GDA. GDA angiogram: Initial contrast injection demonstrates extravasation outside the vasculature into the duodenal lumen. GDA micro coil embolization: At this location of active bleeding,  2 mm interlock coils were initially deployed at the GDA bleeding site. Over the guidewire, the microcatheter was advanced peripheral to the bleeding site. Contrast injection confirms peripheral position in the GDA below the active bleeding and preserved patency of the gastroepiploic artery. Complete back coil embolization was performed of the entire GDA with 3 and 4 mm interlock coils ranging in length from 6-12 cm. Following embolization, repeat GDA angiogram confirms complete occlusion. No further active bleeding. Microcatheter removed. Repeat common  hepatic angiogram demonstrates no further active bleeding and coil occlusion of the GDA. Access removed. Hemostasis obtained with the ExoSeal device. No immediate complication. Patient tolerated the procedure well. IMPRESSION: Positive angiogram for active upper GI bleeding along the mid to distal GDA. Successful micro coil embolization of the entire GDA across the bleeding site to complete stasis. Electronically Signed   By: Judie Petit.  Shick M.D.   On: 03/15/2019 15:25   Ir US Guide Vasc Access Right  Result Date: 03/15/2019 INDICATION: Acutely bleeding duodenal ulcer, anemia requiring transfusions, becoming hemodynamically unstable EXAM: Ultrasound guidance for vascular access Celiac, common hepatic, and GDA catheterizations and angiograms Successful GDA micro catheterization and coil embolization for active bleeding MEDICATIONS: 1% lidocaine local. ANESTHESIA/SEDATION: Patient is hemodynamically unstable. Requiring blood transfusion and pressor support. She is intubated. Moderate Sedation Time: None. The patient's level of consciousness and vital signs were monitored continuously by radiology nursing throughout the procedure under my direct supervision. CONTRAST:  100 cc Omnipaque 300 FLUOROSCOPY TIME:  Fluoroscopy Time: 23 minutes 48 seconds (1,560 mGy). COMPLICATIONS: None immediate. PROCEDURE: Family is unavailable. Patient is intubated, acutely ill and unable to  consent. Emergent consent was obtained from Dr Adela Lank and Miles Costain. A time out was performed prior to the initiation of the procedure. Maximal barrier sterile technique utilized including caps, mask, sterile gowns, sterile gloves, large sterile drape, hand hygiene, and Betadine prep. Under sterile conditions and local anesthesia, ultrasound micropuncture access performed of the right common femoral artery. Images obtained for documentation of the patent right common femoral artery. Five French sheath inserted. C2 catheter utilized to select the celiac origin. Celiac angiogram performed. Celiac: Celiac origin and its branches demonstrate diffuse small caliber vascular constriction related to hemodynamic instability. The celiac origin, splenic, left gastric, and hepatic vasculature all patent. Small caliber GDA and gastroepiploic artery noted. Stomach is markedly distended by fluoroscopy. C2 catheter advanced into the common hepatic artery. Common hepatic angiogram performed. Common hepatic: Common hepatic, proper, left and right hepatic vasculature are patent and diffusely constricted. Small caliber GDA however there is irregularity of the mid to distal GDA and caliber change suspicious for an area of vascular bleeding. Distal to this the gastroepiploic artery is patent. Through the C2 catheter, a Renegade STC catheter was eventually advanced over a single angle GT Glidewire into the GDA. GDA angiogram: Initial contrast injection demonstrates extravasation outside the vasculature into the duodenal lumen. GDA micro coil embolization: At this location of active bleeding, 2 mm interlock coils were initially deployed at the GDA bleeding site. Over the guidewire, the microcatheter was advanced peripheral to the bleeding site. Contrast injection confirms peripheral position in the GDA below the active bleeding and preserved patency of the gastroepiploic artery. Complete back coil embolization was performed of the entire  GDA with 3 and 4 mm interlock coils ranging in length from 6-12 cm. Following embolization, repeat GDA angiogram confirms complete occlusion. No further active bleeding. Microcatheter removed. Repeat common hepatic angiogram demonstrates no further active bleeding and coil occlusion of the GDA. Access removed. Hemostasis obtained with the ExoSeal device. No immediate complication. Patient tolerated the procedure well. IMPRESSION: Positive angiogram for active upper GI bleeding along the mid to distal GDA. Successful micro coil embolization of the entire GDA across the bleeding site to complete stasis. Electronically Signed   By: Judie Petit.  Shick M.D.   On: 03/15/2019 15:25   Dg Chest Port 1 View  Result Date: 03/15/2019 CLINICAL DATA:  Endotracheal tube placement. EXAM: PORTABLE CHEST 1 VIEW COMPARISON:  None. FINDINGS: 1143 hours. Endotracheal tube tip is 2.7 cm above the base of the carina. No pulmonary edema or pleural effusion. Small nodular opacity identified left mid lung, indeterminate. The cardiopericardial silhouette is within normal limits for size. The visualized bony structures of the thorax are intact. Telemetry leads overlie the chest. IMPRESSION: 1. Endotracheal tube tip 2.7 cm above the base of the carina. 2. Small nodular focal opacity left mid lung. Potentially infectious/inflammatory although neoplastic etiology not excluded. Follow-up chest CT recommended to further evaluate. Electronically Signed   By: Kennith CenterEric  Mansell M.D.   On: 03/15/2019 13:02   Dg Abd Portable 2v  Result Date: 03/16/2019 CLINICAL DATA:  Recent onset GI bleed. EXAM: PORTABLE ABDOMEN - 2 VIEW COMPARISON:  None. FINDINGS: The bowel gas pattern is normal. There is no evidence of free air. No radio-opaque calculi or other significant radiographic abnormality is seen. Vascular coils are seen in the right upper quadrant. IMPRESSION: Negative exam. Vascular coils in the right upper quadrant the abdomen noted. Electronically Signed    By: Drusilla Kannerhomas  Dalessio M.D.   On: 03/16/2019 11:13   Ir Shana ChuteEmbo Lennox SoldersArt  Ven Hemorr Lymph Express ScriptsExtrav  Inc Guide Roadmapping  Result Date: 03/15/2019 INDICATION: Acutely bleeding duodenal ulcer, anemia requiring transfusions, becoming hemodynamically unstable EXAM: Ultrasound guidance for vascular access Celiac, common hepatic, and GDA catheterizations and angiograms Successful GDA micro catheterization and coil embolization for active bleeding MEDICATIONS: 1% lidocaine local. ANESTHESIA/SEDATION: Patient is hemodynamically unstable. Requiring blood transfusion and pressor support. She is intubated. Moderate Sedation Time: None. The patient's level of consciousness and vital signs were monitored continuously by radiology nursing throughout the procedure under my direct supervision. CONTRAST:  100 cc Omnipaque 300 FLUOROSCOPY TIME:  Fluoroscopy Time: 23 minutes 48 seconds (1,560 mGy). COMPLICATIONS: None immediate. PROCEDURE: Family is unavailable. Patient is intubated, acutely ill and unable to consent. Emergent consent was obtained from Dr Adela LankArmbruster and Miles CostainShick. A time out was performed prior to the initiation of the procedure. Maximal barrier sterile technique utilized including caps, mask, sterile gowns, sterile gloves, large sterile drape, hand hygiene, and Betadine prep. Under sterile conditions and local anesthesia, ultrasound micropuncture access performed of the right common femoral artery. Images obtained for documentation of the patent right common femoral artery. Five French sheath inserted. C2 catheter utilized to select the celiac origin. Celiac angiogram performed. Celiac: Celiac origin and its branches demonstrate diffuse small caliber vascular constriction related to hemodynamic instability. The celiac origin, splenic, left gastric, and hepatic vasculature all patent. Small caliber GDA and gastroepiploic artery noted. Stomach is markedly distended by fluoroscopy. C2 catheter advanced into the common hepatic  artery. Common hepatic angiogram performed. Common hepatic: Common hepatic, proper, left and right hepatic vasculature are patent and diffusely constricted. Small caliber GDA however there is irregularity of the mid to distal GDA and caliber change suspicious for an area of vascular bleeding. Distal to this the gastroepiploic artery is patent. Through the C2 catheter, a Renegade STC catheter was eventually advanced over a single angle GT Glidewire into the GDA. GDA angiogram: Initial contrast injection demonstrates extravasation outside the vasculature into the duodenal lumen. GDA micro coil embolization: At this location of active bleeding, 2 mm interlock coils were initially deployed at the GDA bleeding site. Over the guidewire, the microcatheter was advanced peripheral to the bleeding site. Contrast injection confirms peripheral position in the GDA below the active bleeding and preserved patency of the gastroepiploic artery. Complete back coil embolization was performed of the entire GDA with 3 and  4 mm interlock coils ranging in length from 6-12 cm. Following embolization, repeat GDA angiogram confirms complete occlusion. No further active bleeding. Microcatheter removed. Repeat common hepatic angiogram demonstrates no further active bleeding and coil occlusion of the GDA. Access removed. Hemostasis obtained with the ExoSeal device. No immediate complication. Patient tolerated the procedure well. IMPRESSION: Positive angiogram for active upper GI bleeding along the mid to distal GDA. Successful micro coil embolization of the entire GDA across the bleeding site to complete stasis. Electronically Signed   By: Judie Petit.  Shick M.D.   On: 03/15/2019 15:25    Labs:  CBC: Recent Labs    02/25/19 0815 03/15/19 0524 03/15/19 0919 03/15/19 1014 03/15/19 2008 03/16/19 0302 03/16/19 0800  WBC 9.7 24.1*  --   --  12.5* 16.9*  --   HGB 12.2 10.6* 8.8* 7.1* 16.3* 15.4* 15.6*  HCT 38.6 32.1* 26.0* 21.0* 48.3* 46.1*   --   PLT 337 501*  --   --  103* 129*  --     COAGS: Recent Labs    03/15/19 0524 03/16/19 0805  INR 1.1 1.0    BMP: Recent Labs    02/25/19 0815 03/15/19 0524 03/15/19 0919 03/15/19 1014 03/15/19 2008 03/16/19 0302 03/16/19 0807  NA 141 134* 140 139 143 143  --   K 3.0* 2.4* 2.6* 2.9* 3.3* 5.6* 4.4  CL 108 103  --   --  118* 119*  --   CO2 22 17*  --   --  18* 16*  --   GLUCOSE 116* 131* 85 144* 145* 199*  --   BUN 16 15  --   --  12 10  --   CALCIUM 9.2 8.3*  --   --  7.1* 6.9*  --   CREATININE 0.48 0.84  --   --  0.64 0.71  --   GFRNONAA >60 >60  --   --  >60 >60  --   GFRAA >60 >60  --   --  >60 >60  --     LIVER FUNCTION TESTS: Recent Labs    12/30/18 1703 02/25/19 0815 03/15/19 0524 03/16/19 0302  BILITOT 0.5 0.4 0.4 0.9  AST 15 15 22 27   ALT 12 11 13 20   ALKPHOS 106 112 128* 58  PROT 7.3 7.4 6.8 3.8*  ALBUMIN 4.5 4.0 3.5 2.2*    Assessment and Plan: Pt with hx bleeding duodenal ulcer, anemia, s/p visceral angio with microcoil embo of GDA 9/13; afebrile; BP ok; no further visible bleeding noted; hgb 15.6, creat 0.71, WBC 16.9, K 5.6, plts 129k; abd film today neg; plans as per GI/CCM/TRH   Electronically Signed: D. Jeananne Rama, PA-C 03/16/2019, 2:51 PM   I spent a total of 15 minutes at the the patient's bedside AND on the patient's hospital floor or unit, greater than 50% of which was counseling/coordinating care for visceral arteriogram with embolization    Patient ID: Ashley [name], female   DOB: [birth year], 53 y.o.   MRN: [id number]

## 2019-03-16 NOTE — Procedures (Signed)
Extubation Procedure Note  Patient Details:   Name: Ashley [name] DOB: 1966/03/10 MRN: [id number]   Airway Documentation:    Vent end date: (S) 03/16/19 Vent end time: (S) 1130   Evaluation  O2 sats: stable throughout Complications: No apparent complications Patient did tolerate procedure well. Bilateral Breath Sounds: Clear, Diminished   Yes  Baldwin [place] Nannette 03/16/2019, 11:37 AM   Positive cuff leak pre extubation. Placed PT on 2 lpm [place] post extubation.

## 2019-03-16 NOTE — Progress Notes (Signed)
NAME:  Ashley [name], MRN:  [id number], DOB:  Apr 20, 1966, LOS: 1 ADMISSION DATE:  03/15/2019, CONSULTATION DATE:  03/15/19 REFERRING MD:  [place] Cellar, MD CHIEF COMPLAINT:  Post-op vent management  Brief History   53 year old female who presented to ED with melena and hematemesis. She underwent EGD 9/13 and transferred to ICU intubated for upper GIB.  History of present illness   Ashley [name] is a 53 year old current smoker (1 PPD) with hx of abdominal pain, intractable nausea and vomiting since January who presented to the ED with abdominal pain/cramping associated with melena x 2-3 days and new onset of hematemesis x 1 day. Hematemesis began prior to admission and had an episode in the ED with bright red blood. GI was consulted. She was intubated for EGD. EGD with blood clots but no focal area of bleeding per GI. Post-procedure, patient transferred from endoscopy to ICU for concerns of ongoing bleed and need to remain intubated. PCCM consulted for upper GIB requiring mechanical ventilation.  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 No active bleeding  Micro Data:    Antimicrobials:    Interim history/subjective:  S/p IR embolization of GDA. No further episodes of hematemesis this morning Sedated this morning  Objective   Blood pressure (!) 149/89, pulse 84, temperature 98.1 F (36.7 C), temperature source Axillary, resp. rate 14, height 5\' 4"  (1.626 m), weight 63.5 kg, SpO2 98 %.    Vent Mode: CPAP;PSV FiO2 (%):  [40 %-100 %] 40 % Set Rate:  [14 bmp] 14 bmp Vt Set:  [430 mL] 430 mL PEEP:  [5  cmH20] 5 cmH20 Pressure Support:  [5 cmH20] 5 cmH20 Plateau Pressure:  [17 cmH20-23 cmH20] 17 cmH20   Intake/Output Summary (Last 24 hours) at 03/16/2019 0900 Last data filed at 03/16/2019 0800 Gross per 24 hour  Intake 6363.5 ml  Output 1050 ml  Net 5313.5 ml   Filed Weights   03/15/19 0506 03/15/19 0535  Weight: 63.5 kg 63.5 kg   Physical Exam: General: Chronically ill-appearing, on mechanical ventilation HENT: [place], AT, ETT in place Eyes: EOMI, no scleral icterus Respiratory: Clear to auscultation bilaterally.  No crackles, wheezing or rales Cardiovascular: RRR, -M/R/G, no JVD GI: BS+, soft, nontender Extremities:-Edema,-tenderness Neuro: [facility] Problem list     Assessment & Plan:   Acute respiratory insufficiency SBT today. If tolerates, will consider extubation Wean sedation for goal of -1: Fentanyl and Propofol  UGIB s/p embolization of gastroduodenal artery Acute blood loss anemia s/p PRBC x 9  Appreciate GI input Trend CBC PPI gtt  Hyperkalemia Hold IV repletion Follow-up repeat K  Best practice:  Diet: NPO Pain/Anxiety/Delirium protocol (if indicated): Yes VAP protocol (if indicated): Yes DVT prophylaxis: Holding in setting of bleed GI prophylaxis: PPI gtt Glucose control: CBG q4h Mobility: BR Code Status: Full Family Communication: Unable to reach family. GI left message with friend. Disposition: ICU  Labs   CBC: Recent Labs  Lab 03/15/19 0524 03/15/19 0919 03/15/19 1014 03/15/19 2008 03/16/19 0302  WBC 24.1*  --   --  12.5* 16.9*  NEUTROABS  --   --   --  10.6*  --   HGB 10.6* 8.8* 7.1* 16.3* 15.4*  HCT 32.1* 26.0* 21.0* 48.3* 46.1*  MCV 84.7  --   --  88.6 91.8  PLT 501*  --   --  103* 129*    Basic Metabolic Panel: Recent Labs  Lab 03/15/19 0524 03/15/19 0919 03/15/19 1014 03/15/19 2008 03/16/19 0302  NA 134* 140 139 143 143  K 2.4* 2.6* 2.9* 3.3* 5.6*  CL 103  --   --  118* 119*  CO2 17*  --   --  18*  16*  GLUCOSE 131* 85 144* 145* 199*  BUN 15  --   --  12 10  CREATININE 0.84  --   --  0.64 0.71  CALCIUM 8.3*  --   --  7.1* 6.9*  MG 2.1  --   --   --   --    GFR: Estimated Creatinine Clearance: 71.8 mL/min (by C-G formula based on SCr of 0.71 mg/dL). Recent Labs  Lab 03/15/19 0524 03/15/19 2008 03/16/19 0302  WBC 24.1* 12.5* 16.9*    Liver Function Tests: Recent Labs  Lab 03/15/19 0524 03/16/19 0302  AST 22 27  ALT 13 20  ALKPHOS 128* 58  BILITOT 0.4 0.9  PROT 6.8 3.8*  ALBUMIN 3.5 2.2*   No results for input(s): LIPASE, AMYLASE in the last 168 hours. No results for input(s): AMMONIA in the last 168 hours.  ABG    Component Value Date/Time   PHART 7.354 03/15/2019 1224   PCO2ART 28.8 (L) 03/15/2019 1224   PO2ART 368 (H) 03/15/2019 1224   HCO3 15.6 (L) 03/15/2019 1224   TCO2 21 03/15/2011 0026   ACIDBASEDEF 8.7 (H) 03/15/2019 1224   O2SAT 99.6 03/15/2019 1224     Coagulation Profile: Recent Labs  Lab 03/15/19 0524  INR 1.1    Cardiac Enzymes: No results for input(s): CKTOTAL, CKMB, CKMBINDEX, TROPONINI in the last 168 hours.  HbA1C: No results found for: HGBA1C  CBG: Recent Labs  Lab 03/15/19 1706 03/15/19 1927 03/15/19 2343 03/16/19 0401 03/16/19 0704  GLUCAP 158* 121* 123* 149* 155*   Critical care time: 36 min    The patient is critically ill with multiple organ systems failure and requires high complexity decision making for assessment and support, frequent evaluation and titration of therapies, application of advanced monitoring technologies and extensive interpretation of multiple databases.   Critical Care Time devoted to patient care services described in this note is 36 Minutes. This time reflects time of care of this signee Dr. Mechele CollinJane Chrishelle [name]. This critical care time does not reflect procedure time, or teaching time or supervisory time of PA/NP/Med student/Med Resident etc but could involve care discussion time.  Mechele CollinJane Ashley [name], M.D.  [facility] 03/16/2019 9:00 AM  Pager: [phone] After hours pager: [phone]

## 2019-03-16 NOTE — Progress Notes (Signed)
Pulmonary Progress Note  Extubated on [place]. Stable. No further episodes of hematemesis. Hg stable. Will transfer to [facility] service tomorrow. Accepted by Dr. Verlon Au.  Rodman Pickle, M.D. [facility] Pulmonary/Critical Care Medicine 03/16/2019 4:40 PM

## 2019-03-17 DIAGNOSIS — Z978 Presence of other specified devices: Secondary | ICD-10-CM

## 2019-03-17 DIAGNOSIS — R1084 Generalized abdominal pain: Secondary | ICD-10-CM

## 2019-03-17 DIAGNOSIS — K254 Chronic or unspecified gastric ulcer with hemorrhage: Principal | ICD-10-CM

## 2019-03-17 LAB — GLUCOSE, CAPILLARY
Glucose-Capillary: 100 mg/dL — ABNORMAL HIGH (ref 70–99)
Glucose-Capillary: 101 mg/dL — ABNORMAL HIGH (ref 70–99)
Glucose-Capillary: 81 mg/dL (ref 70–99)
Glucose-Capillary: 93 mg/dL (ref 70–99)
Glucose-Capillary: 118 mg/dL — ABNORMAL HIGH (ref 70–99)
Glucose-Capillary: 106 mg/dL — ABNORMAL HIGH (ref 70–99)

## 2019-03-17 LAB — BASIC METABOLIC PANEL WITH GFR
Anion gap: 6 (ref 5–15)
BUN: 27 mg/dL — ABNORMAL HIGH (ref 6–20)
CO2: 20 mmol/L — ABNORMAL LOW (ref 22–32)
Calcium: 7.8 mg/dL — ABNORMAL LOW (ref 8.9–10.3)
Chloride: 117 mmol/L — ABNORMAL HIGH (ref 98–111)
Creatinine, Ser: 0.46 mg/dL (ref 0.44–1.00)
GFR calc Af Amer: >60 mL/min
GFR calc non Af Amer: >60 mL/min
Glucose, Bld: 98 mg/dL (ref 70–99)
Potassium: 4 mmol/L (ref 3.5–5.1)
Sodium: 143 mmol/L (ref 135–145)

## 2019-03-17 LAB — CBC
HCT: 37.3 % (ref 36.0–46.0)
HCT: 36.4 % (ref 36.0–46.0)
Hemoglobin: 12.4 g/dL (ref 12.0–15.0)
Hemoglobin: 12 g/dL (ref 12.0–15.0)
MCH: 30.3 pg (ref 26.0–34.0)
MCH: 30.3 pg (ref 26.0–34.0)
MCHC: 33.2 g/dL (ref 30.0–36.0)
MCHC: 33 g/dL (ref 30.0–36.0)
MCV: 91.2 fL (ref 80.0–100.0)
MCV: 91.9 fL (ref 80.0–100.0)
Platelets: 139 10*3/uL — ABNORMAL LOW (ref 150–400)
Platelets: 129 10*3/uL — ABNORMAL LOW (ref 150–400)
RBC: 4.09 MIL/uL (ref 3.87–5.11)
RBC: 3.96 MIL/uL (ref 3.87–5.11)
RDW: 16 % — ABNORMAL HIGH (ref 11.5–15.5)
RDW: 16.1 % — ABNORMAL HIGH (ref 11.5–15.5)
WBC: 15 10*3/uL — ABNORMAL HIGH (ref 4.0–10.5)
WBC: 12.7 10*3/uL — ABNORMAL HIGH (ref 4.0–10.5)
nRBC: 0 % (ref 0.0–0.2)
nRBC: 0 % (ref 0.0–0.2)

## 2019-03-17 LAB — HEMOGLOBIN A1C
Hgb A1c MFr Bld: 5.4 % (ref 4.8–5.6)
Mean Plasma Glucose: 108 mg/dL

## 2019-03-17 LAB — H PYLORI, IGM, IGG, IGA AB
H Pylori IgG: 2.2 {index_val} — ABNORMAL HIGH (ref 0.00–0.79)
H. Pylogi, Iga Abs: <9 U (ref 0.0–8.9)
H. Pylogi, Igm Abs: <9 U (ref 0.0–8.9)

## 2019-03-17 LAB — HEMOGLOBIN: Hemoglobin: 12.7 g/dL (ref 12.0–15.0)

## 2019-03-17 MED ORDER — HYDROMORPHONE HCL 1 MG/ML IJ SOLN
0.5000 mg | INTRAMUSCULAR | Status: DC | PRN
  Administered 2019-03-17 – 2019-03-19 (×15): 0.5 mg via INTRAVENOUS
  Filled 2019-03-17 (×2): qty 1
  Filled 2019-03-17: qty 0.5
  Filled 2019-03-17 (×2): qty 1
  Filled 2019-03-17: qty 0.5
  Filled 2019-03-17 (×2): qty 1
  Filled 2019-03-17 (×3): qty 0.5
  Filled 2019-03-17 (×2): qty 1
  Filled 2019-03-17: qty 0.5
  Filled 2019-03-17 (×2): qty 1

## 2019-03-17 MED ORDER — DEXTROSE 5 % IV SOLN
INTRAVENOUS | Status: DC
  Administered 2019-03-17: via INTRAVENOUS

## 2019-03-17 NOTE — Progress Notes (Signed)
PROGRESS NOTE    Ashley [name]  FTD:[id number] DOB: 21-Jun-1966 DOA: 03/15/2019 PCP: Patient, No Pcp Per    Brief Narrative:  53 year old female who presented to ED with melena and hematemesis. She underwent EGD 9/13 and transferred to ICU intubated for upper GIB.  Assessment & Plan:   Active Problems:   Upper GI bleed  Upper GI bleed with likely hypovolemic shock -Required intubation for airway protection, since extubated 9/14 -GI following. Pt is now s/p EGD with findings of large cratered pyloric ulcer requiring GDA embolization -GI recommendation for continued PPI infusion today then IV PPI BID starting 9/16 -GI recommends repeat EGD in 2 months as outpt  Chronic pain -Primary concern seems to be pain related -NCCSR reviewed. No recent or long-term narcotics were prescribed in past (only 3 day supply of Vicodin was filled 01/02/19, no other controlled substances listed)  -Would wean off narcotics if possible  Prior heavy EtOH -Seems stable at this time -No evidence of withdrawals  Metabolic acidosis -Improved -Repeat bmet in AM  Leukocytosis -Unclear etiology -WBC trending down without abx -Will repeat CBC in AM  DVT prophylaxis: SCd's Code Status: Full Family Communication: Pt in room, family not at bedside Disposition Plan: Uncertain at this time  Consultants:   GI  CCM  IR  Procedures:   EGD 9/13  Antimicrobials: Anti-infectives (From admission, onward)   None       Subjective: Complaining of abd pain  Objective: Vitals:   03/17/19 1042 03/17/19 1200 03/17/19 1400 03/17/19 1600  BP: (!) 163/83 (!) 152/66 134/72   Pulse: 93 88 89 88  Resp: _0 Temp:  (!) 97.5 F (36.4 C)  98.4 F (36.9 C)  TempSrc:  Oral  Oral  SpO2: 98% 97% 96% (!) 89%  Weight:      Height:        Intake/Output Summary (Last 24 hours) at 03/17/2019 1652 Last data filed at 03/17/2019 1620 Gross per 24 hour  Intake 806.42 ml  Output 500 ml  Net 306.42 ml    Filed Weights   03/15/19 0506 03/15/19 0535  Weight: 63.5 kg 63.5 kg    Examination:  General exam: Appears calm and comfortable  Respiratory system: Clear to auscultation. Respiratory effort normal. Cardiovascular system: S1 & S2 heard, RRR Gastrointestinal system: Abdomen is nondistended, soft and nontender. No organomegaly or masses felt. Normal bowel sounds heard. Central nervous system: Alert and oriented. No focal neurological deficits. Extremities: Symmetric 5 x 5 power. Skin: No rashes, lesions  Psychiatry: Judgement and insight appear normal. Mood & affect appropriate.   Data Reviewed: I have personally reviewed following labs and imaging studies  CBC: Recent Labs  Lab 03/15/19 2008 03/16/19 0302 03/16/19 0800 03/16/19 1640 03/16/19 1952 03/17/19 0438 03/17/19 0842  WBC 12.5* 16.9*  --  18.9*  --  15.0* 12.7*  NEUTROABS 10.6*  --   --   --   --   --   --   HGB 16.3* 15.4* 15.6* 14.5 14.0 12.4 12.0  HCT 48.3* 46.1*  --  43.1  --  37.3 36.4  MCV 88.6 91.8  --  90.2  --  91.2 91.9  PLT 103* 129*  --  164  --  139* 062*   Basic Metabolic Panel: Recent Labs  Lab 03/15/19 0524 03/15/19 0919 03/15/19 1014 03/15/19 2008 03/16/19 0302 03/16/19 0807 03/17/19 0438  NA 134* 140 139 143 143  --  143  K 2.4* 2.6*  2.9* 3.3* 5.6* 4.4 4.0  CL 103  --   --  118* 119*  --  117*  CO2 17*  --   --  18* 16*  --  20*  GLUCOSE 131* 85 144* 145* 199*  --  98  BUN 15  --   --  12 10  --  27*  CREATININE 0.84  --   --  0.64 0.71  --  0.46  CALCIUM 8.3*  --   --  7.1* 6.9*  --  7.8*  MG 2.1  --   --   --   --   --   --    GFR: Estimated Creatinine Clearance: 71.8 mL/min (by C-G formula based on SCr of 0.46 mg/dL). Liver Function Tests: Recent Labs  Lab 03/15/19 0524 03/16/19 0302  AST 22 27  ALT 13 20  ALKPHOS 128* 58  BILITOT 0.4 0.9  PROT 6.8 3.8*  ALBUMIN 3.5 2.2*   No results for input(s): LIPASE, AMYLASE in the last 168 hours. No results for input(s):  AMMONIA in the last 168 hours. Coagulation Profile: Recent Labs  Lab 03/15/19 0524 03/16/19 0805  INR 1.1 1.0   Cardiac Enzymes: No results for input(s): CKTOTAL, CKMB, CKMBINDEX, TROPONINI in the last 168 hours. BNP (last 3 results) No results for input(s): PROBNP in the last 8760 hours. HbA1C: Recent Labs    03/15/19 2008  HGBA1C 5.4   CBG: Recent Labs  Lab 03/16/19 1936 03/16/19 2342 03/17/19 0339 03/17/19 0727 03/17/19 1215  GLUCAP 97 86 100* 101* 81   Lipid Profile: Recent Labs    03/15/19 2008  TRIG 127   Thyroid Function Tests: No results for input(s): TSH, T4TOTAL, FREET4, T3FREE, THYROIDAB in the last 72 hours. Anemia Panel: No results for input(s): VITAMINB12, FOLATE, FERRITIN, TIBC, IRON, RETICCTPCT in the last 72 hours. Sepsis Labs: No results for input(s): PROCALCITON, LATICACIDVEN in the last 168 hours.  Recent Results (from the past 240 hour(s))  SARS Coronavirus 2 (Hospital order, Performed in [place] hospital lab) Nasopharyngeal Nasopharyngeal Swab     Status: None   Collection Time: 03/15/19  6:32 AM   Specimen: Nasopharyngeal Swab  Result Value Ref Range Status   SARS Coronavirus 2 NEGATIVE NEGATIVE Final    Comment: (NOTE) If result is NEGATIVE SARS-CoV-2 target nucleic acids are NOT DETECTED. The SARS-CoV-2 RNA is generally detectable in upper and lower  respiratory specimens during the acute phase of infection. The lowest  concentration of SARS-CoV-2 viral copies this assay can detect is 250  copies / mL. A negative result does not preclude SARS-CoV-2 infection  and should not be used as the sole basis for treatment or other  patient management decisions.  A negative result may occur with  improper specimen collection / handling, submission of specimen other  than nasopharyngeal swab, presence of viral mutation(s) within the  areas targeted by this assay, and inadequate number of viral copies  (<250 copies / mL). A negative result  must be combined with clinical  observations, patient history, and epidemiological information. If result is POSITIVE SARS-CoV-2 target nucleic acids are DETECTED. The SARS-CoV-2 RNA is generally detectable in upper and lower  respiratory specimens dur ing the acute phase of infection.  Positive  results are indicative of active infection with SARS-CoV-2.  Clinical  correlation with patient history and other diagnostic information is  necessary to determine patient infection status.  Positive results do  not rule out bacterial infection or co-infection with other   viruses. If result is PRESUMPTIVE POSTIVE SARS-CoV-2 nucleic acids MAY BE PRESENT.   A presumptive positive result was obtained on the submitted specimen  and confirmed on repeat testing.  While 2019 novel coronavirus  (SARS-CoV-2) nucleic acids may be present in the submitted sample  additional confirmatory testing may be necessary for epidemiological  and / or clinical management purposes  to differentiate between  SARS-CoV-2 and other Sarbecovirus currently known to infect humans.  If clinically indicated additional testing with an alternate test  methodology [phone]) is advised. The SARS-CoV-2 RNA is generally  detectable in upper and lower respiratory sp ecimens during the acute  phase of infection. The expected result is Negative. Fact Sheet for Patients:  StrictlyIdeas.no Fact Sheet for Healthcare Providers: BankingDealers.co.za This test is not yet approved or cleared by the [place] FDA and has been authorized for detection and/or diagnosis of SARS-CoV-2 by FDA under an Emergency Use Authorization (EUA).  This EUA will remain in effect (meaning this test can be used) for the duration of the COVID-19 declaration under Section 564(b)(1) of the Act, 21 U.S.C. section 360bbb-3(b)(1), unless the authorization is terminated or revoked sooner. Performed at [facility], [place] [address], [place], [place] [address]   MRSA PCR Screening     Status: None   Collection Time: 03/15/19 11:29 AM   Specimen: Nasal Mucosa; Nasopharyngeal  Result Value Ref Range Status   MRSA by PCR NEGATIVE NEGATIVE Final    Comment:        The GeneXpert MRSA Assay (FDA approved for NASAL specimens only), is one component of a comprehensive MRSA colonization surveillance program. It is not intended to diagnose MRSA infection nor to guide or monitor treatment for MRSA infections. Performed at [facility], [place] [address] [place], [place] [address]      Radiology Studies: Dg Abd Portable 2v  Result Date: 03/16/2019 CLINICAL DATA:  Recent onset GI bleed. EXAM: PORTABLE ABDOMEN - 2 VIEW COMPARISON:  None. FINDINGS: The bowel gas pattern is normal. There is no evidence of free air. No radio-opaque calculi or other significant radiographic abnormality is seen. Vascular coils are seen in the right upper quadrant. IMPRESSION: Negative exam. Vascular coils in the right upper quadrant the abdomen noted. Electronically Signed   By: Inge Rise M.D.   On: 03/16/2019 11:13    Scheduled Meds: . chlorhexidine gluconate (MEDLINE KIT)  15 mL Mouth Rinse BID  . Chlorhexidine Gluconate Cloth  6 each Topical Daily  . insulin aspart  0-9 Units Subcutaneous Q4H  . mouth rinse  15 mL Mouth Rinse 10 times per day  . nicotine  14 mg Transdermal Daily  . [START ON 03/18/2019] pantoprazole  40 mg Intravenous Q12H   Continuous Infusions: . sodium chloride Stopped (03/15/19 1543)  . dextrose Stopped (03/17/19 0700)  . ondansetron (ZOFRAN) IV Stopped (03/16/19 1612)  . pantoprozole (PROTONIX) infusion 8 mg/hr (03/17/19 1603)     LOS: 2 days   Marylu Lund, MD [facility] Pager On Amion  If 7PM-7AM, please contact night-coverage 03/17/2019, 4:52 PM

## 2019-03-17 NOTE — Progress Notes (Signed)
Patient ID: Ashley [name], female   DOB: 1966-03-05, 53 y.o.   MRN: [id number]    Progress Note    Subjective   Day # 3  CC; GI bleed  Patient was tearful when I went into the room, says she is scared because of the abdominal pain and does not understand what is happening. Nurses report 2 episodes of old tarry stools during the night.  She has not had any this morning and no vomiting.  Mild nausea but says she is thirsty.  HGb 12.4, WBC 15    Objective   Vital signs in last 24 hours: Temp:  [97.9 F (36.6 C)-98.6 F (37 C)] 98.4 F (36.9 C) (09/15 0400) Pulse Rate:  [79-122] 81 (09/15 0700) Resp:  [9-23] 15 (09/15 0700) BP: (115-195)/(66-105) 133/74 (09/15 0600) SpO2:  [93 %-100 %] 99 % (09/15 0700) FiO2 (%):  [40 %] 40 % (09/15 0600) Last BM Date: 03/17/19 General:    white female in NAD, tearful Heart:  Regular rate and rhythm; no murmurs Lungs: Respirations even and unlabored, lungs CTA bilaterally Abdomen:  Soft, mildly tender upper abdomen/epigastrium and nondistended. Normal bowel sounds. Extremities:  Without edema. Neurologic:  Alert and oriented,  grossly normal neurologically. Psych:  Cooperative. Normal mood and affect.  Intake/Output from previous day: 09/14 0701 - 09/15 0700 In: 956 [I.V.:905.5; IV Piggyback:50.5] Out: 360 [Urine:360] Intake/Output this shift: No intake/output data recorded.  Lab Results: Recent Labs    03/16/19 0302  03/16/19 1640 03/16/19 1952 03/17/19 0438  WBC 16.9*  --  18.9*  --  15.0*  HGB 15.4*   < > 14.5 14.0 12.4  HCT 46.1*  --  43.1  --  37.3  PLT 129*  --  164  --  139*   < > = values in this interval not displayed.   BMET Recent Labs    03/15/19 2008 03/16/19 0302 03/16/19 0807 03/17/19 0438  NA 143 143  --  143  K 3.3* 5.6* 4.4 4.0  CL 118* 119*  --  117*  CO2 18* 16*  --  20*  GLUCOSE 145* 199*  --  98  BUN 12 10  --  27*  CREATININE 0.64 0.71  --  0.46  CALCIUM 7.1* 6.9*  --  7.8*   LFT Recent Labs      03/16/19 0302  PROT 3.8*  ALBUMIN 2.2*  AST 27  ALT 20  ALKPHOS 58  BILITOT 0.9   PT/INR Recent Labs    03/15/19 0524 03/16/19 0805  LABPROT 14.3 12.9  INR 1.1 1.0    Studies/Results: Ir Angiogram Visceral Selective  Result Date: 03/15/2019 INDICATION: Acutely bleeding duodenal ulcer, anemia requiring transfusions, becoming hemodynamically unstable EXAM: Ultrasound guidance for vascular access Celiac, common hepatic, and GDA catheterizations and angiograms Successful GDA micro catheterization and coil embolization for active bleeding MEDICATIONS: 1% lidocaine local. ANESTHESIA/SEDATION: Patient is hemodynamically unstable. Requiring blood transfusion and pressor support. She is intubated. Moderate Sedation Time: None. The patient's level of consciousness and vital signs were monitored continuously by radiology nursing throughout the procedure under my direct supervision. CONTRAST:  100 cc Omnipaque 300 FLUOROSCOPY TIME:  Fluoroscopy Time: 23 minutes 48 seconds (1,560 mGy). COMPLICATIONS: None immediate. PROCEDURE: Family is unavailable. Patient is intubated, acutely ill and unable to consent. Emergent consent was obtained from Dr Adela Lank and Miles Costain. A time out was performed prior to the initiation of the procedure. Maximal barrier sterile technique utilized including caps, mask, sterile gowns, sterile gloves, large  sterile drape, hand hygiene, and Betadine prep. Under sterile conditions and local anesthesia, ultrasound micropuncture access performed of the right common femoral artery. Images obtained for documentation of the patent right common femoral artery. Five French sheath inserted. C2 catheter utilized to select the celiac origin. Celiac angiogram performed. Celiac: Celiac origin and its branches demonstrate diffuse small caliber vascular constriction related to hemodynamic instability. The celiac origin, splenic, left gastric, and hepatic vasculature all patent. Small caliber GDA  and gastroepiploic artery noted. Stomach is markedly distended by fluoroscopy. C2 catheter advanced into the common hepatic artery. Common hepatic angiogram performed. Common hepatic: Common hepatic, proper, left and right hepatic vasculature are patent and diffusely constricted. Small caliber GDA however there is irregularity of the mid to distal GDA and caliber change suspicious for an area of vascular bleeding. Distal to this the gastroepiploic artery is patent. Through the C2 catheter, a Renegade STC catheter was eventually advanced over a single angle GT Glidewire into the GDA. GDA angiogram: Initial contrast injection demonstrates extravasation outside the vasculature into the duodenal lumen. GDA micro coil embolization: At this location of active bleeding, 2 mm interlock coils were initially deployed at the GDA bleeding site. Over the guidewire, the microcatheter was advanced peripheral to the bleeding site. Contrast injection confirms peripheral position in the GDA below the active bleeding and preserved patency of the gastroepiploic artery. Complete back coil embolization was performed of the entire GDA with 3 and 4 mm interlock coils ranging in length from 6-12 cm. Following embolization, repeat GDA angiogram confirms complete occlusion. No further active bleeding. Microcatheter removed. Repeat common hepatic angiogram demonstrates no further active bleeding and coil occlusion of the GDA. Access removed. Hemostasis obtained with the ExoSeal device. No immediate complication. Patient tolerated the procedure well. IMPRESSION: Positive angiogram for active upper GI bleeding along the mid to distal GDA. Successful micro coil embolization of the entire GDA across the bleeding site to complete stasis. Electronically Signed   By: Judie Petit.  Shick M.D.   On: 03/15/2019 15:25   Ir Angiogram Selective Each Additional Vessel  Result Date: 03/15/2019 INDICATION: Acutely bleeding duodenal ulcer, anemia requiring  transfusions, becoming hemodynamically unstable EXAM: Ultrasound guidance for vascular access Celiac, common hepatic, and GDA catheterizations and angiograms Successful GDA micro catheterization and coil embolization for active bleeding MEDICATIONS: 1% lidocaine local. ANESTHESIA/SEDATION: Patient is hemodynamically unstable. Requiring blood transfusion and pressor support. She is intubated. Moderate Sedation Time: None. The patient's level of consciousness and vital signs were monitored continuously by radiology nursing throughout the procedure under my direct supervision. CONTRAST:  100 cc Omnipaque 300 FLUOROSCOPY TIME:  Fluoroscopy Time: 23 minutes 48 seconds (1,560 mGy). COMPLICATIONS: None immediate. PROCEDURE: Family is unavailable. Patient is intubated, acutely ill and unable to consent. Emergent consent was obtained from Dr Adela Lank and Miles Costain. A time out was performed prior to the initiation of the procedure. Maximal barrier sterile technique utilized including caps, mask, sterile gowns, sterile gloves, large sterile drape, hand hygiene, and Betadine prep. Under sterile conditions and local anesthesia, ultrasound micropuncture access performed of the right common femoral artery. Images obtained for documentation of the patent right common femoral artery. Five French sheath inserted. C2 catheter utilized to select the celiac origin. Celiac angiogram performed. Celiac: Celiac origin and its branches demonstrate diffuse small caliber vascular constriction related to hemodynamic instability. The celiac origin, splenic, left gastric, and hepatic vasculature all patent. Small caliber GDA and gastroepiploic artery noted. Stomach is markedly distended by fluoroscopy. C2 catheter advanced into the common hepatic  artery. Common hepatic angiogram performed. Common hepatic: Common hepatic, proper, left and right hepatic vasculature are patent and diffusely constricted. Small caliber GDA however there is irregularity  of the mid to distal GDA and caliber change suspicious for an area of vascular bleeding. Distal to this the gastroepiploic artery is patent. Through the C2 catheter, a Renegade STC catheter was eventually advanced over a single angle GT Glidewire into the GDA. GDA angiogram: Initial contrast injection demonstrates extravasation outside the vasculature into the duodenal lumen. GDA micro coil embolization: At this location of active bleeding, 2 mm interlock coils were initially deployed at the GDA bleeding site. Over the guidewire, the microcatheter was advanced peripheral to the bleeding site. Contrast injection confirms peripheral position in the GDA below the active bleeding and preserved patency of the gastroepiploic artery. Complete back coil embolization was performed of the entire GDA with 3 and 4 mm interlock coils ranging in length from 6-12 cm. Following embolization, repeat GDA angiogram confirms complete occlusion. No further active bleeding. Microcatheter removed. Repeat common hepatic angiogram demonstrates no further active bleeding and coil occlusion of the GDA. Access removed. Hemostasis obtained with the ExoSeal device. No immediate complication. Patient tolerated the procedure well. IMPRESSION: Positive angiogram for active upper GI bleeding along the mid to distal GDA. Successful micro coil embolization of the entire GDA across the bleeding site to complete stasis. Electronically Signed   By: Judie Petit.  Shick M.D.   On: 03/15/2019 15:25   Ir Angiogram Selective Each Additional Vessel  Result Date: 03/15/2019 INDICATION: Acutely bleeding duodenal ulcer, anemia requiring transfusions, becoming hemodynamically unstable EXAM: Ultrasound guidance for vascular access Celiac, common hepatic, and GDA catheterizations and angiograms Successful GDA micro catheterization and coil embolization for active bleeding MEDICATIONS: 1% lidocaine local. ANESTHESIA/SEDATION: Patient is hemodynamically unstable. Requiring  blood transfusion and pressor support. She is intubated. Moderate Sedation Time: None. The patient's level of consciousness and vital signs were monitored continuously by radiology nursing throughout the procedure under my direct supervision. CONTRAST:  100 cc Omnipaque 300 FLUOROSCOPY TIME:  Fluoroscopy Time: 23 minutes 48 seconds (1,560 mGy). COMPLICATIONS: None immediate. PROCEDURE: Family is unavailable. Patient is intubated, acutely ill and unable to consent. Emergent consent was obtained from Dr Adela Lank and Miles Costain. A time out was performed prior to the initiation of the procedure. Maximal barrier sterile technique utilized including caps, mask, sterile gowns, sterile gloves, large sterile drape, hand hygiene, and Betadine prep. Under sterile conditions and local anesthesia, ultrasound micropuncture access performed of the right common femoral artery. Images obtained for documentation of the patent right common femoral artery. Five French sheath inserted. C2 catheter utilized to select the celiac origin. Celiac angiogram performed. Celiac: Celiac origin and its branches demonstrate diffuse small caliber vascular constriction related to hemodynamic instability. The celiac origin, splenic, left gastric, and hepatic vasculature all patent. Small caliber GDA and gastroepiploic artery noted. Stomach is markedly distended by fluoroscopy. C2 catheter advanced into the common hepatic artery. Common hepatic angiogram performed. Common hepatic: Common hepatic, proper, left and right hepatic vasculature are patent and diffusely constricted. Small caliber GDA however there is irregularity of the mid to distal GDA and caliber change suspicious for an area of vascular bleeding. Distal to this the gastroepiploic artery is patent. Through the C2 catheter, a Renegade STC catheter was eventually advanced over a single angle GT Glidewire into the GDA. GDA angiogram: Initial contrast injection demonstrates extravasation outside  the vasculature into the duodenal lumen. GDA micro coil embolization: At this location of active bleeding,  2 mm interlock coils were initially deployed at the GDA bleeding site. Over the guidewire, the microcatheter was advanced peripheral to the bleeding site. Contrast injection confirms peripheral position in the GDA below the active bleeding and preserved patency of the gastroepiploic artery. Complete back coil embolization was performed of the entire GDA with 3 and 4 mm interlock coils ranging in length from 6-12 cm. Following embolization, repeat GDA angiogram confirms complete occlusion. No further active bleeding. Microcatheter removed. Repeat common hepatic angiogram demonstrates no further active bleeding and coil occlusion of the GDA. Access removed. Hemostasis obtained with the ExoSeal device. No immediate complication. Patient tolerated the procedure well. IMPRESSION: Positive angiogram for active upper GI bleeding along the mid to distal GDA. Successful micro coil embolization of the entire GDA across the bleeding site to complete stasis. Electronically Signed   By: Judie PetitM.  Shick M.D.   On: 03/15/2019 15:25   Ir [place] Guide Vasc Access Right  Result Date: 03/15/2019 INDICATION: Acutely bleeding duodenal ulcer, anemia requiring transfusions, becoming hemodynamically unstable EXAM: Ultrasound guidance for vascular access Celiac, common hepatic, and GDA catheterizations and angiograms Successful GDA micro catheterization and coil embolization for active bleeding MEDICATIONS: 1% lidocaine local. ANESTHESIA/SEDATION: Patient is hemodynamically unstable. Requiring blood transfusion and pressor support. She is intubated. Moderate Sedation Time: None. The patient's level of consciousness and vital signs were monitored continuously by radiology nursing throughout the procedure under my direct supervision. CONTRAST:  100 cc Omnipaque 300 FLUOROSCOPY TIME:  Fluoroscopy Time: 23 minutes 48 seconds (1,560 mGy).  COMPLICATIONS: None immediate. PROCEDURE: Family is unavailable. Patient is intubated, acutely ill and unable to consent. Emergent consent was obtained from Dr Adela LankArmbruster and Miles CostainShick. A time out was performed prior to the initiation of the procedure. Maximal barrier sterile technique utilized including caps, mask, sterile gowns, sterile gloves, large sterile drape, hand hygiene, and Betadine prep. Under sterile conditions and local anesthesia, ultrasound micropuncture access performed of the right common femoral artery. Images obtained for documentation of the patent right common femoral artery. Five French sheath inserted. C2 catheter utilized to select the celiac origin. Celiac angiogram performed. Celiac: Celiac origin and its branches demonstrate diffuse small caliber vascular constriction related to hemodynamic instability. The celiac origin, splenic, left gastric, and hepatic vasculature all patent. Small caliber GDA and gastroepiploic artery noted. Stomach is markedly distended by fluoroscopy. C2 catheter advanced into the common hepatic artery. Common hepatic angiogram performed. Common hepatic: Common hepatic, proper, left and right hepatic vasculature are patent and diffusely constricted. Small caliber GDA however there is irregularity of the mid to distal GDA and caliber change suspicious for an area of vascular bleeding. Distal to this the gastroepiploic artery is patent. Through the C2 catheter, a Renegade STC catheter was eventually advanced over a single angle GT Glidewire into the GDA. GDA angiogram: Initial contrast injection demonstrates extravasation outside the vasculature into the duodenal lumen. GDA micro coil embolization: At this location of active bleeding, 2 mm interlock coils were initially deployed at the GDA bleeding site. Over the guidewire, the microcatheter was advanced peripheral to the bleeding site. Contrast injection confirms peripheral position in the GDA below the active bleeding  and preserved patency of the gastroepiploic artery. Complete back coil embolization was performed of the entire GDA with 3 and 4 mm interlock coils ranging in length from 6-12 cm. Following embolization, repeat GDA angiogram confirms complete occlusion. No further active bleeding. Microcatheter removed. Repeat common hepatic angiogram demonstrates no further active bleeding and coil occlusion of the GDA. Access  removed. Hemostasis obtained with the ExoSeal device. No immediate complication. Patient tolerated the procedure well. IMPRESSION: Positive angiogram for active upper GI bleeding along the mid to distal GDA. Successful micro coil embolization of the entire GDA across the bleeding site to complete stasis. Electronically Signed   By: Judie PetitM.  Shick M.D.   On: 03/15/2019 15:25   Dg Chest Port 1 View  Result Date: 03/15/2019 CLINICAL DATA:  Endotracheal tube placement. EXAM: PORTABLE CHEST 1 VIEW COMPARISON:  None. FINDINGS: 1143 hours. Endotracheal tube tip is 2.7 cm above the base of the carina. No pulmonary edema or pleural effusion. Small nodular opacity identified left mid lung, indeterminate. The cardiopericardial silhouette is within normal limits for size. The visualized bony structures of the thorax are intact. Telemetry leads overlie the chest. IMPRESSION: 1. Endotracheal tube tip 2.7 cm above the base of the carina. 2. Small nodular focal opacity left mid lung. Potentially infectious/inflammatory although neoplastic etiology not excluded. Follow-up chest CT recommended to further evaluate. Electronically Signed   By: Kennith CenterEric  Mansell M.D.   On: 03/15/2019 13:02   Dg Abd Portable 2v  Result Date: 03/16/2019 CLINICAL DATA:  Recent onset GI bleed. EXAM: PORTABLE ABDOMEN - 2 VIEW COMPARISON:  None. FINDINGS: The bowel gas pattern is normal. There is no evidence of free air. No radio-opaque calculi or other significant radiographic abnormality is seen. Vascular coils are seen in the right upper quadrant.  IMPRESSION: Negative exam. Vascular coils in the right upper quadrant the abdomen noted. Electronically Signed   By: Drusilla Kannerhomas  Dalessio M.D.   On: 03/16/2019 11:13   Ir Shana ChuteEmbo [place] SoldersArt  Ven Hemorr Lymph Express ScriptsExtrav  Inc Guide Roadmapping  Result Date: 03/15/2019 INDICATION: Acutely bleeding duodenal ulcer, anemia requiring transfusions, becoming hemodynamically unstable EXAM: Ultrasound guidance for vascular access Celiac, common hepatic, and GDA catheterizations and angiograms Successful GDA micro catheterization and coil embolization for active bleeding MEDICATIONS: 1% lidocaine local. ANESTHESIA/SEDATION: Patient is hemodynamically unstable. Requiring blood transfusion and pressor support. She is intubated. Moderate Sedation Time: None. The patient's level of consciousness and vital signs were monitored continuously by radiology nursing throughout the procedure under my direct supervision. CONTRAST:  100 cc Omnipaque 300 FLUOROSCOPY TIME:  Fluoroscopy Time: 23 minutes 48 seconds (1,560 mGy). COMPLICATIONS: None immediate. PROCEDURE: Family is unavailable. Patient is intubated, acutely ill and unable to consent. Emergent consent was obtained from Dr Adela LankArmbruster and Miles CostainShick. A time out was performed prior to the initiation of the procedure. Maximal barrier sterile technique utilized including caps, mask, sterile gowns, sterile gloves, large sterile drape, hand hygiene, and Betadine prep. Under sterile conditions and local anesthesia, ultrasound micropuncture access performed of the right common femoral artery. Images obtained for documentation of the patent right common femoral artery. Five French sheath inserted. C2 catheter utilized to select the celiac origin. Celiac angiogram performed. Celiac: Celiac origin and its branches demonstrate diffuse small caliber vascular constriction related to hemodynamic instability. The celiac origin, splenic, left gastric, and hepatic vasculature all patent. Small caliber GDA and  gastroepiploic artery noted. Stomach is markedly distended by fluoroscopy. C2 catheter advanced into the common hepatic artery. Common hepatic angiogram performed. Common hepatic: Common hepatic, proper, left and right hepatic vasculature are patent and diffusely constricted. Small caliber GDA however there is irregularity of the mid to distal GDA and caliber change suspicious for an area of vascular bleeding. Distal to this the gastroepiploic artery is patent. Through the C2 catheter, a Renegade STC catheter was eventually advanced over a single angle GT Glidewire into  the GDA. GDA angiogram: Initial contrast injection demonstrates extravasation outside the vasculature into the duodenal lumen. GDA micro coil embolization: At this location of active bleeding, 2 mm interlock coils were initially deployed at the [place] bleeding site. Over the guidewire, the microcatheter was advanced peripheral to the bleeding site. Contrast injection confirms peripheral position in the GDA below the active bleeding and preserved patency of the gastroepiploic artery. Complete back coil embolization was performed of the entire GDA with 3 and 4 mm interlock coils ranging in length from 6-12 cm. Following embolization, repeat GDA angiogram confirms complete occlusion. No further active bleeding. Microcatheter removed. Repeat common hepatic angiogram demonstrates no further active bleeding and coil occlusion of the GDA. Access removed. Hemostasis obtained with the ExoSeal device. No immediate complication. Patient tolerated the procedure well. IMPRESSION: Positive angiogram for active upper GI bleeding along the mid to distal GDA. Successful micro coil embolization of the entire GDA across the bleeding site to complete stasis. Electronically Signed   By: Jerilynn Mages.  Shick M.D.   On: 03/15/2019 15:25       Assessment / Plan:    #25 53 year old white female with acute upper GI bleed secondary to large cratered pyloric ulcer, requiring GDA  embolization for control of bleeding. Patient also had multiple gastric ulcers, nonbleeding  H. pylori serology pending  Patient has not had any active bleeding over the past 24 hours. She has been complaining of significant pain but says this is the same pain she had prior to coming to the hospital.  She thought the ulcer was gone because she had undergone the procedure and was not sure why she was still having discomfort.  Long discussion today regarding events since she was hospitalized and findings.  Continue PPI infusion through today then IV PPI twice daily Start clear liquids Follow-up H. Pylori Patient will need repeat EGD in about 2 months as outpatient.  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

## 2019-03-17 NOTE — Progress Notes (Signed)
[facility] Progress Note Patient Name: Ashley [name] DOB: 1966-04-02 MRN: [id number]   Date of Service  03/17/2019  HPI/Events of Note  Blood glucose trending lower - Blood glucose = 97 --> 86.  eICU Interventions  Will order: 1. D5W to run IV at 30 mL/hour.      Intervention Category Major Interventions: Other:  Sommer,Steven Cornelia Copa 03/17/2019, 12:12 AM

## 2019-03-17 NOTE — Progress Notes (Signed)
The patient is sleeping soundly , with equal rise and fall of chest while nurse in room administering medication.

## 2019-03-18 ENCOUNTER — Inpatient Hospital Stay (HOSPITAL_COMMUNITY): Payer: Self-pay

## 2019-03-18 DIAGNOSIS — D649 Anemia, unspecified: Secondary | ICD-10-CM | POA: Diagnosis present

## 2019-03-18 DIAGNOSIS — E876 Hypokalemia: Secondary | ICD-10-CM | POA: Diagnosis present

## 2019-03-18 DIAGNOSIS — R1010 Upper abdominal pain, unspecified: Secondary | ICD-10-CM

## 2019-03-18 DIAGNOSIS — Z8719 Personal history of other diseases of the digestive system: Secondary | ICD-10-CM

## 2019-03-18 DIAGNOSIS — K259 Gastric ulcer, unspecified as acute or chronic, without hemorrhage or perforation: Secondary | ICD-10-CM | POA: Diagnosis present

## 2019-03-18 DIAGNOSIS — I959 Hypotension, unspecified: Secondary | ICD-10-CM | POA: Diagnosis present

## 2019-03-18 LAB — GLUCOSE, CAPILLARY
Glucose-Capillary: 101 mg/dL — ABNORMAL HIGH (ref 70–99)
Glucose-Capillary: 106 mg/dL — ABNORMAL HIGH (ref 70–99)
Glucose-Capillary: 134 mg/dL — ABNORMAL HIGH (ref 70–99)
Glucose-Capillary: 101 mg/dL — ABNORMAL HIGH (ref 70–99)

## 2019-03-18 LAB — BASIC METABOLIC PANEL
Anion gap: 7 (ref 5–15)
BUN: 8 mg/dL (ref 6–20)
CO2: 21 mmol/L — ABNORMAL LOW (ref 22–32)
Calcium: 8 mg/dL — ABNORMAL LOW (ref 8.9–10.3)
Chloride: 109 mmol/L (ref 98–111)
Creatinine, Ser: 0.42 mg/dL — ABNORMAL LOW (ref 0.44–1.00)
GFR calc Af Amer: >60 mL/min (ref >60)
GFR calc non Af Amer: >60 mL/min (ref >60)
Glucose, Bld: 118 mg/dL — ABNORMAL HIGH (ref 70–99)
Potassium: 3.3 mmol/L — ABNORMAL LOW (ref 3.5–5.1)
Sodium: 137 mmol/L (ref 135–145)

## 2019-03-18 LAB — CBC
HCT: 35.6 % — ABNORMAL LOW (ref 36.0–46.0)
Hemoglobin: 11.9 g/dL — ABNORMAL LOW (ref 12.0–15.0)
MCH: 30.7 pg (ref 26.0–34.0)
MCHC: 33.4 g/dL (ref 30.0–36.0)
MCV: 91.8 fL (ref 80.0–100.0)
Platelets: 162 10*3/uL (ref 150–400)
RBC: 3.88 MIL/uL (ref 3.87–5.11)
RDW: 15.9 % — ABNORMAL HIGH (ref 11.5–15.5)
WBC: 12.1 10*3/uL — ABNORMAL HIGH (ref 4.0–10.5)
nRBC: 0 % (ref 0.0–0.2)

## 2019-03-18 LAB — MAGNESIUM: Magnesium: 1.6 mg/dL — ABNORMAL LOW (ref 1.7–2.4)

## 2019-03-18 LAB — TRIGLYCERIDES: Triglycerides: 140 mg/dL (ref <150)

## 2019-03-18 LAB — HEMOGLOBIN: Hemoglobin: 12.6 g/dL (ref 12.0–15.0)

## 2019-03-18 MED ORDER — SUCRALFATE 1 GM/10ML PO SUSP
1.0000 g | Freq: Three times a day (TID) | ORAL | Status: DC
  Administered 2019-03-18 – 2019-03-20 (×7): 1 g via ORAL
  Filled 2019-03-18 (×7): qty 10

## 2019-03-18 MED ORDER — MAGNESIUM SULFATE 2 GM/50ML IV SOLN
2.0000 g | INTRAVENOUS | Status: AC
  Administered 2019-03-18 (×2): 2 g via INTRAVENOUS
  Filled 2019-03-18 (×2): qty 50

## 2019-03-18 MED ORDER — ALUM & MAG HYDROXIDE-SIMETH 200-200-20 MG/5ML PO SUSP
30.0000 mL | Freq: Once | ORAL | Status: AC
  Administered 2019-03-18: 30 mL via ORAL
  Filled 2019-03-18: qty 30

## 2019-03-18 MED ORDER — POTASSIUM CHLORIDE 20 MEQ PO PACK
40.0000 meq | PACK | Freq: Once | ORAL | Status: AC
  Administered 2019-03-18: 09:00:00 40 meq via ORAL
  Filled 2019-03-18: qty 2

## 2019-03-18 MED ORDER — DICYCLOMINE HCL 10 MG/5ML PO SOLN
10.0000 mg | Freq: Once | ORAL | Status: AC
  Administered 2019-03-18: 10 mg via ORAL
  Filled 2019-03-18: qty 5

## 2019-03-18 NOTE — Progress Notes (Signed)
Patient ID: Ashley [name], female   DOB: 1966/04/18, 53 y.o.   MRN: [id number]    Progress Note   Subjective  Day # 4 CC; GI bleed/ large duodenal ulcer   Still complaining of abdominal pain, not as bad as it had been a couple of days ago.  She tried to take some clear liquids this morning which exacerbated the pain.. No melena, some nausea no vomiting  WBC 12.1, HGB 11.9 K+ 3.3 , MG 1.6 Hpylori IGG +, IGA ab neg, Igm ab neg       Objective   Vital signs in last 24 hours: Temp:  [97.5 F (36.4 C)-99.1 F (37.3 C)] 98.4 F (36.9 C) (09/16 0800) Pulse Rate:  [86-103] 99 (09/16 0800) Resp:  [10-24] 19 (09/16 0800) BP: (130-163)/(62-83) 131/67 (09/16 0400) SpO2:  [89 %-99 %] 99 % (09/16 0800) Last BM Date: 03/17/19 General:   Older white female in NAD, uncomfortable appearing Heart:  Regular rate and rhythm; no murmurs Lungs: Respirations even and unlabored, lungs CTA bilaterally Abdomen:  Soft, tender epigastrium and nondistended. Normal bowel sounds. Extremities:  Without edema. Neurologic:  Alert and oriented,  grossly normal neurologically. Psych:  Cooperative. Normal mood and affect.  Intake/Output from previous day: 09/15 0701 - 09/16 0700 In: 520 [P.O.:100; I.V.:420] Out: 850 [Urine:750; Stool:100] Intake/Output this shift: No intake/output data recorded.  Lab Results: Recent Labs    03/17/19 0438 03/17/19 0842 03/17/19 1924 03/18/19 0457  WBC 15.0* 12.7*  --  12.1*  HGB 12.4 12.0 12.7 11.9*  HCT 37.3 36.4  --  35.6*  PLT 139* 129*  --  162   BMET Recent Labs    03/16/19 0302 03/16/19 0807 03/17/19 0438 03/18/19 0457  NA 143  --  143 137  K 5.6* 4.4 4.0 3.3*  CL 119*  --  117* 109  CO2 16*  --  20* 21*  GLUCOSE 199*  --  98 118*  BUN 10  --  27* 8  CREATININE 0.71  --  0.46 0.42*  CALCIUM 6.9*  --  7.8* 8.0*   LFT Recent Labs    03/16/19 0302  PROT 3.8*  ALBUMIN 2.2*  AST 27  ALT 20  ALKPHOS 58  BILITOT 0.9   PT/INR Recent Labs     03/16/19 0805  LABPROT 12.9  INR 1.0    Studies/Results: Dg Abd Portable 2v  Result Date: 03/16/2019 CLINICAL DATA:  Recent onset GI bleed. EXAM: PORTABLE ABDOMEN - 2 VIEW COMPARISON:  None. FINDINGS: The bowel gas pattern is normal. There is no evidence of free air. No radio-opaque calculi or other significant radiographic abnormality is seen. Vascular coils are seen in the right upper quadrant. IMPRESSION: Negative exam. Vascular coils in the right upper quadrant the abdomen noted. Electronically Signed   By: Inge Rise M.D.   On: 03/16/2019 11:13       Assessment / Plan:    #20 53 year old white female admitted with acute upper GI bleed, found secondary to large cratered pyloric ulcer Patient required IR intervention with embolization of GDA for control of bleeding, on 03/15/2019  She has not had any active bleeding since but has had some drift in hemoglobin. She has had persistent complaints of abdominal pain and nausea.  She says she was having the same pain prior to admission.  #2 history of EtOH abuse, no active withdrawal #3 hypokalemia-correcting  Plan; repeat abdominal films this a.m. and chest x-ray Continue IV PPI twice daily We will  add GI cocktail 30 cc every 4 hours PRN, and also ordered Carafate suspension 1 g 3 times daily between meals If abdominal films negative, will start gradually advancing diet  Patient will need to be scheduled for follow-up EGD as an outpatient with Dr. Adela LankArmbruster in about 2 months.    Active Problems:   Upper GI bleed     LOS: 3 days   Ashley Nedrow EsterwoodPA-C  03/18/2019, 8:56 AM

## 2019-03-18 NOTE — Progress Notes (Signed)
[facility]  PROGRESS NOTE  Ashley [name] KJZ:[id number] DOB: 1965/08/26 DOA: 03/15/2019 PCP: Patient, No Pcp Per  Brief History    Ashley [name] is a 53 y.o. year old female with medical history significant for intermittent nausea/vomiting x several months, unintentional weight loss x several monthswho presented on 03/15/2019 with bright red blood per rectum and dark stool and abdominal painin setting of admitted NSAID use for several days and was found to have acute symptomatic anemia related to upper GI bleed.   ED Course: HR 115, BP 87/93, fecal occult positive, K 2.4, hgb 10.6, WBC 24.  Patient was given IV fluids and started on IV protonix due to witnessed large volume hematemesis in ED and concern for upper GIB.    A large cratered ulcer was found on EGD ( 9/13) with no active bleeding at that time but residual blood/blood clots seen in stomach.  She was transferred to ICU as she was intubated for EGD and continued to protect airway and minimize risk of aspiration.  IR proceeded with CTA and emergent angiography embolization .  She did require Levophed and bolus fluids for persistent hypotension as well as 2 U PRBC for hgb  A & P     Acute upper gi bleed secondary to gastric ulcer, in setting of NSAID use. Stable. S/p IR coil embolization on 9/13. Has some minimal residual dark stool and abdominal pain, abd xr shows no acute findings. Has complted 72 hours of IV PPI infusion, transition to IV PPI BID per GI. Added sucralfate and bentyl for supportive care. Attempt to wean narcotics, antiemetics PRN,  advance diet slowly   Acute on chronic normocytic anemia secondary to GI bleed. Nadir of 7.1 ( baseline 9-10). S/p 2 U during hospital course. Has not needed repeat transfusions with stable hgb of 12.6 and no signs of active bleeding. Closely monitor    History of EtoH abuse. No signs of withdrawal here    Hypokalemia and hypomagnesemia, continue oral/IV repletion respectively,  monitor in am.    GERD, continue PPI.    DVT prophylaxis: SCDs Code Status: FULL  Family Communication: none at bedside Disposition Plan: transfer from SDU to regular floor, continue IV PPI, advance diet, monitor hgb and electrolytes.       [facility] Direct contact: see [email or web address] (further directions at bottom of note if needed) 7PM-7AM contact night coverage as at bottom of note 03/18/2019, 2:42 PM  LOS: 3 days   Consultants  . PCCM, IR, GI  Procedures  . Intubated 9/13, extubated 9/14 . visceral arteriogram with embolization 9/13 . EGD 9/13  Antibiotics  . none  Interval History/Subjective  Still has some abdominal pain and nausea  Objective   Vitals:  Vitals:   03/18/19 0825 03/18/19 1200  BP: (!) 161/70   Pulse: 90 99  Resp: 20 16  Temp:  98.3 F (36.8 C)  SpO2: 97% 100%    Exam:  Awake Alert, Oriented X 3, No new F.N deficits, Normal affect [place].AT, Symmetrical Chest wall movement, Good air movement bilaterally, CTAB RRR,No Gallops,Rubs or new Murmurs, No Parasternal Heave +ve B.Sounds, Abd Soft, slightly tender with palpation in epigastric area.,No rebound - guarding or rigidity. No Cyanosis, Clubbing or edema, No new Rash or bruise     I have personally reviewed the following:   Data Reviewed: Basic Metabolic Panel: Recent Labs  Lab 03/15/19 0524  03/15/19 1014 03/15/19 2008 03/16/19 0302 03/16/19 0807 03/17/19 0438 03/18/19 0457  NA 134*   < >  139 143 143  --  143 137  K 2.4*   < > 2.9* 3.3* 5.6* 4.4 4.0 3.3*  CL 103  --   --  118* 119*  --  117* 109  CO2 17*  --   --  18* 16*  --  20* 21*  GLUCOSE 131*   < > 144* 145* 199*  --  98 118*  BUN 15  --   --  12 10  --  27* 8  CREATININE 0.84  --   --  0.64 0.71  --  0.46 0.42*  CALCIUM 8.3*  --   --  7.1* 6.9*  --  7.8* 8.0*  MG 2.1  --   --   --   --   --   --  1.6*   < > = values in this interval not displayed.   Liver Function Tests: Recent Labs  Lab 03/15/19 0524 03/16/19  0302  AST 22 27  ALT 13 20  ALKPHOS 128* 58  BILITOT 0.4 0.9  PROT 6.8 3.8*  ALBUMIN 3.5 2.2*   No results for input(s): LIPASE, AMYLASE in the last 168 hours. No results for input(s): AMMONIA in the last 168 hours. CBC: Recent Labs  Lab 03/15/19 2008 03/16/19 0302  03/16/19 1640  03/17/19 0438 03/17/19 0842 03/17/19 1924 03/18/19 0457 03/18/19 1138  WBC 12.5* 16.9*  --  18.9*  --  15.0* 12.7*  --  12.1*  --   NEUTROABS 10.6*  --   --   --   --   --   --   --   --   --   HGB 16.3* 15.4*   < > 14.5   < > 12.4 12.0 12.7 11.9* 12.6  HCT 48.3* 46.1*  --  43.1  --  37.3 36.4  --  35.6*  --   MCV 88.6 91.8  --  90.2  --  91.2 91.9  --  91.8  --   PLT 103* 129*  --  164  --  139* 129*  --  162  --    < > = values in this interval not displayed.   Cardiac Enzymes: No results for input(s): CKTOTAL, CKMB, CKMBINDEX, TROPONINI in the last 168 hours. BNP (last 3 results) No results for input(s): BNP in the last 8760 hours.  ProBNP (last 3 results) No results for input(s): PROBNP in the last 8760 hours.  CBG: Recent Labs  Lab 03/17/19 1941 03/17/19 2308 03/18/19 0307 03/18/19 0754 03/18/19 1115  GLUCAP 118* 106* 101* 106* 134*    Recent Results (from the past 240 hour(s))  SARS Coronavirus 2 [facility] order, Performed in [facility] hospital lab) Nasopharyngeal Nasopharyngeal Swab     Status: None   Collection Time: 03/15/19  6:32 AM   Specimen: Nasopharyngeal Swab  Result Value Ref Range Status   SARS Coronavirus 2 NEGATIVE NEGATIVE Final    Comment: (NOTE) If result is NEGATIVE SARS-CoV-2 target nucleic acids are NOT DETECTED. The SARS-CoV-2 RNA is generally detectable in upper and lower  respiratory specimens during the acute phase of infection. The lowest  concentration of SARS-CoV-2 viral copies this assay can detect is 250  copies / mL. A negative result does not preclude SARS-CoV-2 infection  and should not be used as the sole basis for treatment or other   patient management decisions.  A negative result may occur with  improper specimen collection / handling, submission of specimen other  than nasopharyngeal swab,  presence of viral mutation(s) within the  areas targeted by this assay, and inadequate number of viral copies  (<250 copies / mL). A negative result must be combined with clinical  observations, patient history, and epidemiological information. If result is POSITIVE SARS-CoV-2 target nucleic acids are DETECTED. The SARS-CoV-2 RNA is generally detectable in upper and lower  respiratory specimens dur ing the acute phase of infection.  Positive  results are indicative of active infection with SARS-CoV-2.  Clinical  correlation with patient history and other diagnostic information is  necessary to determine patient infection status.  Positive results do  not rule out bacterial infection or co-infection with other viruses. If result is PRESUMPTIVE POSTIVE SARS-CoV-2 nucleic acids MAY BE PRESENT.   A presumptive positive result was obtained on the submitted specimen  and confirmed on repeat testing.  While 2019 novel coronavirus  (SARS-CoV-2) nucleic acids may be present in the submitted sample  additional confirmatory testing may be necessary for epidemiological  and / or clinical management purposes  to differentiate between  SARS-CoV-2 and other Sarbecovirus currently known to infect humans.  If clinically indicated additional testing with an alternate test  methodology [phone]) is advised. The SARS-CoV-2 RNA is generally  detectable in upper and lower respiratory sp ecimens during the acute  phase of infection. The expected result is Negative. Fact Sheet for Patients:  StrictlyIdeas.no Fact Sheet for Healthcare Providers: BankingDealers.co.za This test is not yet approved or cleared by the [place] FDA and has been authorized for detection and/or diagnosis of SARS-CoV-2 by  FDA under an Emergency Use Authorization (EUA).  This EUA will remain in effect (meaning this test can be used) for the duration of the COVID-19 declaration under Section 564(b)(1) of the Act, 21 U.S.C. section 360bbb-3(b)(1), unless the authorization is terminated or revoked sooner. Performed at [facility], [place] [address], [place], [place] [address]   MRSA PCR Screening     Status: None   Collection Time: 03/15/19 11:29 AM   Specimen: Nasal Mucosa; Nasopharyngeal  Result Value Ref Range Status   MRSA by PCR NEGATIVE NEGATIVE Final    Comment:        The GeneXpert MRSA Assay (FDA approved for NASAL specimens only), is one component of a comprehensive MRSA colonization surveillance program. It is not intended to diagnose MRSA infection nor to guide or monitor treatment for MRSA infections. Performed at [facility], [place] [address] [place], [place] [address]      Studies: Dg Abd Acute 2+v W 1v Chest  Result Date: 03/18/2019 CLINICAL DATA:  Abdominal pain, history of ulcers EXAM: DG ABDOMEN ACUTE W/ 1V CHEST COMPARISON:  03/16/2019 FINDINGS: There is no evidence of dilated bowel loops or free intraperitoneal air. Gaseous distension of the small bowel and colon. No radiopaque calculi or other significant radiographic abnormality is seen. Heart size and mediastinal contours are within normal limits. Both lungs are clear. IMPRESSION: Negative abdominal radiographs.  No acute cardiopulmonary disease. Electronically Signed   By: Kathreen Devoid   On: 03/18/2019 13:33    Scheduled Meds: . chlorhexidine gluconate (MEDLINE KIT)  15 mL Mouth Rinse BID  . Chlorhexidine Gluconate Cloth  6 each Topical Daily  . dicyclomine  10 mg Oral Once  . insulin aspart  0-9 Units Subcutaneous Q4H  . nicotine  14 mg Transdermal Daily  . pantoprazole  40 mg Intravenous Q12H  . sucralfate  1 g Oral TID BM   Continuous Infusions: . sodium chloride Stopped (  03/15/19  1543)  . dextrose Stopped (03/17/19 0700)  . ondansetron (ZOFRAN) IV Stopped (03/16/19 1612)    Active Problems:   Upper GI bleed      Desiree Hane  [facility]

## 2019-03-19 DIAGNOSIS — R768 Other specified abnormal immunological findings in serum: Secondary | ICD-10-CM

## 2019-03-19 DIAGNOSIS — K253 Acute gastric ulcer without hemorrhage or perforation: Secondary | ICD-10-CM

## 2019-03-19 LAB — BPAM FFP
Blood Product Expiration Date: 202009182359
Blood Product Expiration Date: 202009182359
Blood Product Expiration Date: 202009182359
Blood Product Expiration Date: 202009182359
Unit Type and Rh: 600
Unit Type and Rh: 600
Unit Type and Rh: 6200
Unit Type and Rh: 6200

## 2019-03-19 LAB — BASIC METABOLIC PANEL
Anion gap: 9 (ref 5–15)
BUN: <5 mg/dL — ABNORMAL LOW (ref 6–20)
CO2: 23 mmol/L (ref 22–32)
Calcium: 8.1 mg/dL — ABNORMAL LOW (ref 8.9–10.3)
Chloride: 109 mmol/L (ref 98–111)
Creatinine, Ser: 0.39 mg/dL — ABNORMAL LOW (ref 0.44–1.00)
GFR calc Af Amer: >60 mL/min (ref >60)
GFR calc non Af Amer: >60 mL/min (ref >60)
Glucose, Bld: 118 mg/dL — ABNORMAL HIGH (ref 70–99)
Potassium: 3.9 mmol/L (ref 3.5–5.1)
Sodium: 141 mmol/L (ref 135–145)

## 2019-03-19 LAB — PREPARE FRESH FROZEN PLASMA
Unit division: 0
Unit division: 0
Unit division: 0
Unit division: 0

## 2019-03-19 LAB — CBC
HCT: 40.5 % (ref 36.0–46.0)
Hemoglobin: 13.6 g/dL (ref 12.0–15.0)
MCH: 30 pg (ref 26.0–34.0)
MCHC: 33.6 g/dL (ref 30.0–36.0)
MCV: 89.4 fL (ref 80.0–100.0)
Platelets: 134 10*3/uL — ABNORMAL LOW (ref 150–400)
RBC: 4.53 MIL/uL (ref 3.87–5.11)
RDW: 15 % (ref 11.5–15.5)
WBC: 10.7 10*3/uL — ABNORMAL HIGH (ref 4.0–10.5)
nRBC: 0 % (ref 0.0–0.2)

## 2019-03-19 LAB — MAGNESIUM: Magnesium: 2.2 mg/dL (ref 1.7–2.4)

## 2019-03-19 MED ORDER — HYDROCODONE-ACETAMINOPHEN 5-325 MG PO TABS
1.0000 | ORAL_TABLET | ORAL | Status: DC | PRN
  Administered 2019-03-19 – 2019-03-20 (×5): 1 via ORAL
  Filled 2019-03-19 (×5): qty 1

## 2019-03-19 MED ORDER — DICYCLOMINE HCL 10 MG PO CAPS
10.0000 mg | ORAL_CAPSULE | Freq: Four times a day (QID) | ORAL | Status: DC | PRN
  Administered 2019-03-19 – 2019-03-20 (×3): 10 mg via ORAL
  Filled 2019-03-19 (×5): qty 1

## 2019-03-19 MED ORDER — HYDROMORPHONE HCL 1 MG/ML IJ SOLN
0.5000 mg | INTRAMUSCULAR | Status: DC | PRN
  Administered 2019-03-19 – 2019-03-21 (×3): 0.5 mg via INTRAVENOUS
  Filled 2019-03-19 (×4): qty 0.5

## 2019-03-19 MED ORDER — HYDROCODONE-ACETAMINOPHEN 5-325 MG PO TABS: 1.0000 | ORAL_TABLET | ORAL | Status: DC | PRN

## 2019-03-19 NOTE — Plan of Care (Signed)

## 2019-03-19 NOTE — Progress Notes (Signed)
     [facility] Progress Note  CC:  GI bleed and large duodenal ulcer  Subjective:  Day #5  Abdominal pain is better.  Tolerating clear liquids, but coffee made her nauseous this AM.  Hgb 13.6 this AM.  Still having some dark stool, likely old blood.  Objective:  Vital signs in last 24 hours: Temp:  [98.2 F (36.8 C)-99.3 F (37.4 C)] 98.4 F (36.9 C) (09/17 0552) Pulse Rate:  [74-99] 74 (09/17 0552) Resp:  [16-18] 18 (09/17 0552) BP: (119-156)/(68-89) 156/89 (09/17 0552) SpO2:  [97 %-100 %] 99 % (09/17 0552) Last BM Date: 03/17/19 General:  Alert, Well-developed, in NAD Heart:  Regular rate and rhythm; no murmurs Pulm:  CTAB.  No increased WOB. Abdomen:  Soft, non-distended.  BS present.  Non-tender. Extremities:  Without edema. Neurologic:  Alert and oriented x 4;  grossly normal neurologically. Psych:  Alert and cooperative. Normal mood and affect.  Intake/Output from previous day: 09/16 0701 - 09/17 0700 In: 698.9 [P.O.:480; I.V.:64.7; IV Piggyback:154.2] Out: 5601 [Urine:5600; Stool:1]  Lab Results: Recent Labs    03/17/19 0842  03/18/19 0457 03/18/19 1138 03/19/19 0234  WBC 12.7*  --  12.1*  --  10.7*  HGB 12.0   < > 11.9* 12.6 13.6  HCT 36.4  --  35.6*  --  40.5  PLT 129*  --  162  --  134*   < > = values in this interval not displayed.   BMET Recent Labs    03/17/19 0438 03/18/19 0457 03/19/19 0234  NA 143 137 141  K 4.0 3.3* 3.9  CL 117* 109 109  CO2 20* 21* 23  GLUCOSE 98 118* 118*  BUN 27* 8 <5*  CREATININE 0.46 0.42* 0.39*  CALCIUM 7.8* 8.0* 8.1*   Dg Abd Acute 2+v W 1v Chest  Result Date: 03/18/2019 CLINICAL DATA:  Abdominal pain, history of ulcers EXAM: DG ABDOMEN ACUTE W/ 1V CHEST COMPARISON:  03/16/2019 FINDINGS: There is no evidence of dilated bowel loops or free intraperitoneal air. Gaseous distension of the small bowel and colon. No radiopaque calculi or other significant radiographic abnormality is seen. Heart size and  mediastinal contours are within normal limits. Both lungs are clear. IMPRESSION: Negative abdominal radiographs.  No acute cardiopulmonary disease. Electronically Signed   By: Kathreen Devoid   On: 03/18/2019 13:33   Assessment / Plan: #18 53 year old white female admitted with acute upper GI bleed, found secondary to large cratered pyloric ulcer.  Patient required IR intervention with embolization of GDA for control of bleeding, on 03/15/2019.  She has not had any active bleeding since and her Hgb has normalized.  #2 history of EtOH abuse, no active withdrawal #3 hypokalemia-normalized.  -Continue IV PPI twice daily as well as Carafate suspension 3 times daily. -Will advance to full liquid diet. -Once tolerating more PO will need to start treatment for Hpylori. -Patient will need to be scheduled for follow-up EGD as an outpatient with Dr. Havery Moros in about 2 months.   LOS: 4 days   Laban Emperor. Vaishnav Demartin  03/19/2019, 9:44 AM

## 2019-03-19 NOTE — Plan of Care (Signed)

## 2019-03-19 NOTE — Progress Notes (Signed)
[facility]  PROGRESS NOTE  [name] EHO:[id number] DOB: Feb 15, 1966 DOA: 03/15/2019 PCP: Patient, No Pcp Per  Brief History    Ashley [name] is a 53 y.o. year old female with medical history significant for intermittent nausea/vomiting x several months, unintentional weight loss x several monthswho presented on 03/15/2019 with bright red blood per rectum and dark stool and abdominal painin setting of admitted NSAID use for several days and was found to have acute symptomatic anemia related to upper GI bleed.   ED Course: HR 115, BP 87/93, fecal occult positive, K 2.4, hgb 10.6, WBC 24.  Patient was given IV fluids and started on IV protonix due to witnessed large volume hematemesis in ED and concern for upper GIB.    A large cratered ulcer was found on EGD ( 9/13) with no active bleeding at that time but residual blood/blood clots seen in stomach.  She was transferred to ICU as she was intubated for EGD and continued to protect airway and minimize risk of aspiration.  IR proceeded with CTA and emergent angiography embolization .  She did require Levophed and bolus fluids for persistent hypotension as well as 2 U PRBC for hgb  A & P     Acute upper gi bleed secondary to gastric ulcer, in setting of NSAID use. Stable. S/p IR coil embolization on 9/13. Has some minimal residual dark stool and abdominal pain, hemoglobin stable, will advance to full liquid diet, continue IV PPI twice daily per GI recommendations, Carafate suspension 3 times daily, Bentyl as needed, attempt to wean narcotics, antiemetics PRN    Acute on chronic normocytic anemia secondary to GI bleed. Nadir of 7.1 ( baseline 9-10). S/p 2 U during hospital course. Has not needed repeat transfusions with normalization of hemoglobin currently and no signs of active bleeding. Closely monitor    History of EtoH abuse. No signs of withdrawal here    Hypokalemia and hypomagnesemia, continue oral/IV repletion respectively,  monitor in am.    GERD, continue PPI.    DVT prophylaxis: SCDs Code Status: FULL  Family Communication: none at bedside Disposition Plan: , continue IV PPI, advance diet, monitor hgb and electrolytes.       [facility] Direct contact: see [email or web address] (further directions at bottom of note if needed) 7PM-7AM contact night coverage as at bottom of note 03/19/2019, 4:21 PM  LOS: 4 days   Consultants  . PCCM, IR, GI  Procedures  . Intubated 9/13, extubated 9/14 . visceral arteriogram with embolization 9/13 . EGD 9/13  Antibiotics  . none  Interval History/Subjective  Still has some abdominal pain and nausea but feels much better, ready to try full liquids  Objective   Vitals:  Vitals:   03/19/19 0552 03/19/19 1444  BP: (!) 156/89 137/61  Pulse: 74 88  Resp: 18 15  Temp: 98.4 F (36.9 C) 98.7 F (37.1 C)  SpO2: 99% 99%    Exam:  Awake Alert, Oriented X 3, No new F.N deficits, Normal affect [place].AT, Symmetrical Chest wall movement, Good air movement bilaterally, CTAB RRR,No Gallops,Rubs or new Murmurs, No Parasternal Heave +ve B.Sounds, Abd Soft, slightly tender with palpation in epigastric area.,No rebound - guarding or rigidity. No Cyanosis, Clubbing or edema, No new Rash or bruise     I have personally reviewed the following:   Data Reviewed: Basic Metabolic Panel: Recent Labs  Lab 03/15/19 0524  03/15/19 2008 03/16/19 0302 03/16/19 0807 03/17/19 0438 03/18/19 0457 03/19/19 0234  NA 134*   < >  143 143  --  143 137 141  K 2.4*   < > 3.3* 5.6* 4.4 4.0 3.3* 3.9  CL 103  --  118* 119*  --  117* 109 109  CO2 17*  --  18* 16*  --  20* 21* 23  GLUCOSE 131*   < > 145* 199*  --  98 118* 118*  BUN 15  --  12 10  --  27* 8 <5*  CREATININE 0.84  --  0.64 0.71  --  0.46 0.42* 0.39*  CALCIUM 8.3*  --  7.1* 6.9*  --  7.8* 8.0* 8.1*  MG 2.1  --   --   --   --   --  1.6* 2.2   < > = values in this interval not displayed.   Liver Function Tests: Recent  Labs  Lab 03/15/19 0524 03/16/19 0302  AST 22 27  ALT 13 20  ALKPHOS 128* 58  BILITOT 0.4 0.9  PROT 6.8 3.8*  ALBUMIN 3.5 2.2*   No results for input(s): LIPASE, AMYLASE in the last 168 hours. No results for input(s): AMMONIA in the last 168 hours. CBC: Recent Labs  Lab 03/15/19 2008  03/16/19 1640  03/17/19 0438 03/17/19 0842 03/17/19 1924 03/18/19 0457 03/18/19 1138 03/19/19 0234  WBC 12.5*   < > 18.9*  --  15.0* 12.7*  --  12.1*  --  10.7*  NEUTROABS 10.6*  --   --   --   --   --   --   --   --   --   HGB 16.3*   < > 14.5   < > 12.4 12.0 12.7 11.9* 12.6 13.6  HCT 48.3*   < > 43.1  --  37.3 36.4  --  35.6*  --  40.5  MCV 88.6   < > 90.2  --  91.2 91.9  --  91.8  --  89.4  PLT 103*   < > 164  --  139* 129*  --  162  --  134*   < > = values in this interval not displayed.   Cardiac Enzymes: No results for input(s): CKTOTAL, CKMB, CKMBINDEX, TROPONINI in the last 168 hours. BNP (last 3 results) No results for input(s): BNP in the last 8760 hours.  ProBNP (last 3 results) No results for input(s): PROBNP in the last 8760 hours.  CBG: Recent Labs  Lab 03/17/19 2308 03/18/19 0307 03/18/19 0754 03/18/19 1115 03/18/19 1525  GLUCAP 106* 101* 106* 134* 101*    Recent Results (from the past 240 hour(s))  SARS Coronavirus 2 [facility] order, Performed in [facility] hospital lab) Nasopharyngeal Nasopharyngeal Swab     Status: None   Collection Time: 03/15/19  6:32 AM   Specimen: Nasopharyngeal Swab  Result Value Ref Range Status   SARS Coronavirus 2 NEGATIVE NEGATIVE Final    Comment: (NOTE) If result is NEGATIVE SARS-CoV-2 target nucleic acids are NOT DETECTED. The SARS-CoV-2 RNA is generally detectable in upper and lower  respiratory specimens during the acute phase of infection. The lowest  concentration of SARS-CoV-2 viral copies this assay can detect is 250  copies / mL. A negative result does not preclude SARS-CoV-2 infection  and should not be used as the  sole basis for treatment or other  patient management decisions.  A negative result may occur with  improper specimen collection / handling, submission of specimen other  than nasopharyngeal swab, presence of viral mutation(s) within the  areas  targeted by this assay, and inadequate number of viral copies  (<250 copies / mL). A negative result must be combined with clinical  observations, patient history, and epidemiological information. If result is POSITIVE SARS-CoV-2 target nucleic acids are DETECTED. The SARS-CoV-2 RNA is generally detectable in upper and lower  respiratory specimens dur ing the acute phase of infection.  Positive  results are indicative of active infection with SARS-CoV-2.  Clinical  correlation with patient history and other diagnostic information is  necessary to determine patient infection status.  Positive results do  not rule out bacterial infection or co-infection with other viruses. If result is PRESUMPTIVE POSTIVE SARS-CoV-2 nucleic acids MAY BE PRESENT.   A presumptive positive result was obtained on the submitted specimen  and confirmed on repeat testing.  While 2019 novel coronavirus  (SARS-CoV-2) nucleic acids may be present in the submitted sample  additional confirmatory testing may be necessary for epidemiological  and / or clinical management purposes  to differentiate between  SARS-CoV-2 and other Sarbecovirus currently known to infect humans.  If clinically indicated additional testing with an alternate test  methodology [phone]) is advised. The SARS-CoV-2 RNA is generally  detectable in upper and lower respiratory sp ecimens during the acute  phase of infection. The expected result is Negative. Fact Sheet for Patients:  StrictlyIdeas.no Fact Sheet for Healthcare Providers: BankingDealers.co.za This test is not yet approved or cleared by the [place] FDA and has been authorized for detection  and/or diagnosis of SARS-CoV-2 by FDA under an Emergency Use Authorization (EUA).  This EUA will remain in effect (meaning this test can be used) for the duration of the COVID-19 declaration under Section 564(b)(1) of the Act, 21 U.S.C. section 360bbb-3(b)(1), unless the authorization is terminated or revoked sooner. Performed at [facility], [place] [address] [place], [place] [address]   MRSA PCR Screening     Status: None   Collection Time: 03/15/19 11:29 AM   Specimen: Nasal Mucosa; Nasopharyngeal  Result Value Ref Range Status   MRSA by PCR NEGATIVE NEGATIVE Final    Comment:        The GeneXpert MRSA Assay (FDA approved for NASAL specimens only), is one component of a comprehensive MRSA colonization surveillance program. It is not intended to diagnose MRSA infection nor to guide or monitor treatment for MRSA infections. Performed at [facility], [place] [address], [place], [place] [address]      Studies: Dg Abd Acute 2+v W 1v Chest  Result Date: 03/18/2019 CLINICAL DATA:  Abdominal pain, history of ulcers EXAM: DG ABDOMEN ACUTE W/ 1V CHEST COMPARISON:  03/16/2019 FINDINGS: There is no evidence of dilated bowel loops or free intraperitoneal air. Gaseous distension of the small bowel and colon. No radiopaque calculi or other significant radiographic abnormality is seen. Heart size and mediastinal contours are within normal limits. Both lungs are clear. IMPRESSION: Negative abdominal radiographs.  No acute cardiopulmonary disease. Electronically Signed   By: Kathreen Devoid   On: 03/18/2019 13:33    Scheduled Meds: . chlorhexidine gluconate (MEDLINE KIT)  15 mL Mouth Rinse BID  . Chlorhexidine Gluconate Cloth  6 each Topical Daily  . nicotine  14 mg Transdermal Daily  . pantoprazole  40 mg Intravenous Q12H  . sucralfate  1 g Oral TID BM   Continuous Infusions: . sodium chloride Stopped (03/15/19 1543)  . dextrose Stopped (03/17/19 0700)  .  ondansetron [facility]) IV 8 mg (03/19/19 0859)    Active Problems:  Intractable nausea and vomiting   Upper GI bleed   Symptomatic anemia   Hypotension   Pyloric ulcer   Hypokalemia   Hypomagnesemia      Desiree Hane  [facility]

## 2019-03-20 DIAGNOSIS — R112 Nausea with vomiting, unspecified: Secondary | ICD-10-CM

## 2019-03-20 DIAGNOSIS — R1013 Epigastric pain: Secondary | ICD-10-CM

## 2019-03-20 DIAGNOSIS — K259 Gastric ulcer, unspecified as acute or chronic, without hemorrhage or perforation: Secondary | ICD-10-CM

## 2019-03-20 LAB — CBC
HCT: 38.4 % (ref 36.0–46.0)
Hemoglobin: 12.7 g/dL (ref 12.0–15.0)
MCH: 30.7 pg (ref 26.0–34.0)
MCHC: 33.1 g/dL (ref 30.0–36.0)
MCV: 92.8 fL (ref 80.0–100.0)
Platelets: 252 10*3/uL (ref 150–400)
RBC: 4.14 MIL/uL (ref 3.87–5.11)
RDW: 14.7 % (ref 11.5–15.5)
WBC: 12.3 10*3/uL — ABNORMAL HIGH (ref 4.0–10.5)
nRBC: 0 % (ref 0.0–0.2)

## 2019-03-20 MED ORDER — PANTOPRAZOLE SODIUM 40 MG PO TBEC: 40.0000 mg | DELAYED_RELEASE_TABLET | Freq: Two times a day (BID) | ORAL | Status: DC

## 2019-03-20 MED ORDER — ONDANSETRON HCL 4 MG/2ML IJ SOLN
4.0000 mg | Freq: Four times a day (QID) | INTRAMUSCULAR | Status: DC
  Administered 2019-03-20 – 2019-03-21 (×5): 4 mg via INTRAVENOUS
  Filled 2019-03-20 (×5): qty 2

## 2019-03-20 MED ORDER — PANTOPRAZOLE SODIUM 40 MG IV SOLR
40.0000 mg | Freq: Two times a day (BID) | INTRAVENOUS | Status: AC
  Administered 2019-03-20: 40 mg via INTRAVENOUS
  Filled 2019-03-20: qty 40

## 2019-03-20 MED ORDER — DICYCLOMINE HCL 10 MG PO CAPS
10.0000 mg | ORAL_CAPSULE | Freq: Three times a day (TID) | ORAL | Status: DC
  Administered 2019-03-20 – 2019-03-21 (×5): 10 mg via ORAL
  Filled 2019-03-20 (×6): qty 1

## 2019-03-20 MED ORDER — SUCRALFATE 1 GM/10ML PO SUSP
1.0000 g | Freq: Three times a day (TID) | ORAL | Status: DC
  Administered 2019-03-20 – 2019-03-21 (×4): 1 g via ORAL
  Filled 2019-03-20 (×4): qty 10

## 2019-03-20 MED ORDER — PANTOPRAZOLE SODIUM 40 MG PO TBEC
40.0000 mg | DELAYED_RELEASE_TABLET | Freq: Two times a day (BID) | ORAL | Status: DC
  Administered 2019-03-21: 40 mg via ORAL
  Filled 2019-03-20: qty 1

## 2019-03-20 NOTE — Progress Notes (Signed)
[facility]  PROGRESS NOTE  Ashley [name] YFV:[id number] DOB: 21-Feb-1966 DOA: 03/15/2019 PCP: Patient, No Pcp Per  Brief History    Ashley [name] is a 53 y.o. year old female with medical history significant for intermittent nausea/vomiting x several months, unintentional weight loss x several monthswho presented on 03/15/2019 with bright red blood per rectum and dark stool and abdominal painin setting of admitted NSAID use for several days and was found to have acute symptomatic anemia related to upper GI bleed.   ED Course: HR 115, BP 87/93, fecal occult positive, K 2.4, hgb 10.6, WBC 24.  Patient was given IV fluids and started on IV protonix due to witnessed large volume hematemesis in ED and concern for upper GIB.    A large cratered ulcer was found on EGD ( 9/13) with no active bleeding at that time but residual blood/blood clots seen in stomach.  She was transferred to ICU as she was intubated for EGD and continued to protect airway and minimize risk of aspiration.  IR proceeded with CTA and emergent angiography embolization .  She did require Levophed and bolus fluids for persistent hypotension as well as 2 U PRBC for hgb  A & P     Acute upper gi bleed secondary to gastric ulcer, in setting of NSAID use. Stable. S/p IR coil embolization on 9/13. Has some minimal residual dark stool and abdominal pain, hemoglobin stable, will advance to full liquid diet, continue IV Protonix twice daily, switch to oral PPI twice daily  on discharge, Carafate suspension 3 times daily, increase Bentyl to scheduled dosing to assist with better pain control given patient had more nausea/abdominal pain last night with diet, attempt to wean narcotics, antiemetics PRN before meals, avid NSAIDs ordered treatment with Pylera for 10 days with close outpatient follow-up for surveillance EGD (2 months with Dr. Kyla Balzarine confirm eradication of H. pylori per GI recommendations on discharge.   Acute on  chronic normocytic anemia secondary to GI bleed. Nadir of 7.1 ( baseline 9-10). S/p 2 U during hospital course. Has not needed repeat transfusions with normalization of hemoglobin currently and no signs of active bleeding. Closely monitor    History of EtoH abuse. No signs of withdrawal here    Hypokalemia and hypomagnesemia, continue oral/IV repletion respectively, monitor in am.    GERD, continue PPI.    DVT prophylaxis: SCDs Code Status: FULL  Family Communication: none at bedside Disposition Plan: , continue IV PPI, advance diet as able, adequate pain control, monitor electrolytes, anticipate discharge in 24 hours if remains clinically stable, monitor hgb and electrolytes.       [facility] Direct contact: see [email or web address] (further directions at bottom of note if needed) 7PM-7AM contact night coverage as at bottom of note 03/20/2019, 5:56 PM  LOS: 5 days   Consultants  . PCCM, IR, GI  Procedures  . Intubated 9/13, extubated 9/14 . visceral arteriogram with embolization 9/13 . EGD 9/13  Antibiotics  . none  Interval History/Subjective  Still has some abdominal pain and nausea but feels much better, ready to try full liquids  Objective   Vitals:  Vitals:   03/19/19 2147 03/20/19 0540  BP: 139/81 134/89  Pulse: 75 76  Resp: 18   Temp: 98.4 F (36.9 C) 98.4 F (36.9 C)  SpO2: 100% 99%    Exam:  Awake Alert, Oriented X 3, No new F.N deficits, Normal affect [place].AT, Symmetrical Chest wall movement, Good air movement bilaterally, CTAB RRR,No Gallops,Rubs or  new Murmurs, No Parasternal Heave +ve B.Sounds, Abd Soft, slightly tender with palpation in epigastric area.,No rebound - guarding or rigidity. No Cyanosis, Clubbing or edema, No new Rash or bruise     I have personally reviewed the following:   Data Reviewed: Basic Metabolic Panel: Recent Labs  Lab 03/15/19 0524  03/15/19 2008 03/16/19 0302 03/16/19 0807 03/17/19 0438 03/18/19 0457 03/19/19  0234  NA 134*   < > 143 143  --  143 137 141  K 2.4*   < > 3.3* 5.6* 4.4 4.0 3.3* 3.9  CL 103  --  118* 119*  --  117* 109 109  CO2 17*  --  18* 16*  --  20* 21* 23  GLUCOSE 131*   < > 145* 199*  --  98 118* 118*  BUN 15  --  12 10  --  27* 8 <5*  CREATININE 0.84  --  0.64 0.71  --  0.46 0.42* 0.39*  CALCIUM 8.3*  --  7.1* 6.9*  --  7.8* 8.0* 8.1*  MG 2.1  --   --   --   --   --  1.6* 2.2   < > = values in this interval not displayed.   Liver Function Tests: Recent Labs  Lab 03/15/19 0524 03/16/19 0302  AST 22 27  ALT 13 20  ALKPHOS 128* 58  BILITOT 0.4 0.9  PROT 6.8 3.8*  ALBUMIN 3.5 2.2*   No results for input(s): LIPASE, AMYLASE in the last 168 hours. No results for input(s): AMMONIA in the last 168 hours. CBC: Recent Labs  Lab 03/15/19 2008  03/17/19 0438 03/17/19 0842 03/17/19 1924 03/18/19 0457 03/18/19 1138 03/19/19 0234 03/20/19 0235  WBC 12.5*   < > 15.0* 12.7*  --  12.1*  --  10.7* 12.3*  NEUTROABS 10.6*  --   --   --   --   --   --   --   --   HGB 16.3*   < > 12.4 12.0 12.7 11.9* 12.6 13.6 12.7  HCT 48.3*   < > 37.3 36.4  --  35.6*  --  40.5 38.4  MCV 88.6   < > 91.2 91.9  --  91.8  --  89.4 92.8  PLT 103*   < > 139* 129*  --  162  --  134* 252   < > = values in this interval not displayed.   Cardiac Enzymes: No results for input(s): CKTOTAL, CKMB, CKMBINDEX, TROPONINI in the last 168 hours. BNP (last 3 results) No results for input(s): BNP in the last 8760 hours.  ProBNP (last 3 results) No results for input(s): PROBNP in the last 8760 hours.  CBG: Recent Labs  Lab 03/17/19 2308 03/18/19 0307 03/18/19 0754 03/18/19 1115 03/18/19 1525  GLUCAP 106* 101* 106* 134* 101*    Recent Results (from the past 240 hour(s))  SARS Coronavirus 2 [facility] order, Performed in [facility] hospital lab) Nasopharyngeal Nasopharyngeal Swab     Status: None   Collection Time: 03/15/19  6:32 AM   Specimen: Nasopharyngeal Swab  Result Value Ref Range Status    SARS Coronavirus 2 NEGATIVE NEGATIVE Final    Comment: (NOTE) If result is NEGATIVE SARS-CoV-2 target nucleic acids are NOT DETECTED. The SARS-CoV-2 RNA is generally detectable in upper and lower  respiratory specimens during the acute phase of infection. The lowest  concentration of SARS-CoV-2 viral copies this assay can detect is 250  copies / mL. A  negative result does not preclude SARS-CoV-2 infection  and should not be used as the sole basis for treatment or other  patient management decisions.  A negative result may occur with  improper specimen collection / handling, submission of specimen other  than nasopharyngeal swab, presence of viral mutation(s) within the  areas targeted by this assay, and inadequate number of viral copies  (<250 copies / mL). A negative result must be combined with clinical  observations, patient history, and epidemiological information. If result is POSITIVE SARS-CoV-2 target nucleic acids are DETECTED. The SARS-CoV-2 RNA is generally detectable in upper and lower  respiratory specimens dur ing the acute phase of infection.  Positive  results are indicative of active infection with SARS-CoV-2.  Clinical  correlation with patient history and other diagnostic information is  necessary to determine patient infection status.  Positive results do  not rule out bacterial infection or co-infection with other viruses. If result is PRESUMPTIVE POSTIVE SARS-CoV-2 nucleic acids MAY BE PRESENT.   A presumptive positive result was obtained on the submitted specimen  and confirmed on repeat testing.  While 2019 novel coronavirus  (SARS-CoV-2) nucleic acids may be present in the submitted sample  additional confirmatory testing may be necessary for epidemiological  and / or clinical management purposes  to differentiate between  SARS-CoV-2 and other Sarbecovirus currently known to infect humans.  If clinically indicated additional testing with an alternate test   methodology [phone]) is advised. The SARS-CoV-2 RNA is generally  detectable in upper and lower respiratory sp ecimens during the acute  phase of infection. The expected result is Negative. Fact Sheet for Patients:  StrictlyIdeas.no Fact Sheet for Healthcare Providers: BankingDealers.co.za This test is not yet approved or cleared by the [place] FDA and has been authorized for detection and/or diagnosis of SARS-CoV-2 by FDA under an Emergency Use Authorization (EUA).  This EUA will remain in effect (meaning this test can be used) for the duration of the COVID-19 declaration under Section 564(b)(1) of the Act, 21 U.S.C. section 360bbb-3(b)(1), unless the authorization is terminated or revoked sooner. Performed at [facility], [place] [address], [place], [place] [address]   MRSA PCR Screening     Status: None   Collection Time: 03/15/19 11:29 AM   Specimen: Nasal Mucosa; Nasopharyngeal  Result Value Ref Range Status   MRSA by PCR NEGATIVE NEGATIVE Final    Comment:        The GeneXpert MRSA Assay (FDA approved for NASAL specimens only), is one component of a comprehensive MRSA colonization surveillance program. It is not intended to diagnose MRSA infection nor to guide or monitor treatment for MRSA infections. Performed at [facility], [place] [address] [place], [place] [address]      Studies: No results found.  Scheduled Meds: . chlorhexidine gluconate (MEDLINE KIT)  15 mL Mouth Rinse BID  . Chlorhexidine Gluconate Cloth  6 each Topical Daily  . dicyclomine  10 mg Oral TID AC & HS  . nicotine  14 mg Transdermal Daily  . ondansetron (ZOFRAN) IV  4 mg Intravenous Q6H  . pantoprazole  40 mg Intravenous Q12H  . sucralfate  1 g Oral TID AC & HS   Continuous Infusions: . sodium chloride Stopped (03/15/19 1543)    Active Problems:   Intractable nausea and vomiting   Upper GI bleed    Symptomatic anemia   Hypotension   Pyloric ulcer   Hypokalemia   Hypomagnesemia      Joyelle Siedlecki  D Demetries Coia  [facility]

## 2019-03-20 NOTE — Progress Notes (Addendum)
[facility] Progress Note  CC:  GI bleed and large duodenal ulcer  Subjective:  Day #6  Did well yesterday but then had more pain and nausea starting last night around 1030 PM.  Hgb normal.  Mild leukocytosis with WBC count 12.3.  Objective:  Vital signs in last 24 hours: Temp:  [98.4 F (36.9 C)-98.7 F (37.1 C)] 98.4 F (36.9 C) (09/18 0540) Pulse Rate:  [75-88] 76 (09/18 0540) Resp:  [15-18] 18 (09/17 2147) BP: (134-139)/(61-89) 134/89 (09/18 0540) SpO2:  [99 %-100 %] 99 % (09/18 0540) Last BM Date: 03/18/19 General:  Alert, Well-developed, in NAD Heart:  Regular rate and rhythm; no murmurs Pulm:  CTAB.  No increased WOB.  No M/R/G. Abdomen:  Soft, non-distended.  BS present.  Mild to moderate TTP. Extremities:  Without edema. Neurologic:  Alert and oriented x 4;  grossly normal neurologically. Psych:  Alert and cooperative. Normal mood and affect.  Intake/Output from previous day: 09/17 0701 - 09/18 0700 In: 634 [P.O.:340; I.V.:240; IV Piggyback:54] Out: 2650 [Urine:2650]  Lab Results: Recent Labs    03/18/19 0457 03/18/19 1138 03/19/19 0234 03/20/19 0235  WBC 12.1*  --  10.7* 12.3*  HGB 11.9* 12.6 13.6 12.7  HCT 35.6*  --  40.5 38.4  PLT 162  --  134* 252   BMET Recent Labs    03/18/19 0457 03/19/19 0234  NA 137 141  K 3.3* 3.9  CL 109 109  CO2 21* 23  GLUCOSE 118* 118*  BUN 8 <5*  CREATININE 0.42* 0.39*  CALCIUM 8.0* 8.1*   Dg Abd Acute 2+v W 1v Chest  Result Date: 03/18/2019 CLINICAL DATA:  Abdominal pain, history of ulcers EXAM: DG ABDOMEN ACUTE W/ 1V CHEST COMPARISON:  03/16/2019 FINDINGS: There is no evidence of dilated bowel loops or free intraperitoneal air. Gaseous distension of the small bowel and colon. No radiopaque calculi or other significant radiographic abnormality is seen. Heart size and mediastinal contours are within normal limits. Both lungs are clear. IMPRESSION: Negative abdominal radiographs.  No acute  cardiopulmonary disease. Electronically Signed   By: Kathreen Devoid   On: 03/18/2019 13:33   Assessment / Plan: #43 53 year old white female admitted with acute upper GI bleed, found secondary to large cratered pyloric ulcer.  Patient required IR intervention with embolization of GDA for control of bleeding, on 03/15/2019.  She has not had any active bleeding since and her Hgb has normalized.  #2 history of EtOH abuse, no active withdrawal #3 hypokalemia-normalized.  -Continue IV PPI twice daily (change to PO upon discharge) -Will change carafate suspension to ACHS (so she gets dose at bedtime as well). -Will keep on full liquid diet today. -Will schedule zofran to 4 mg every 6 hours ATC. -Initiate Hpylori treatment upon discharge for 10 days. -Patient will need to be scheduled for follow-up EGD as an outpatient with Dr. Havery Moros in about 2 months.   LOS: 5 days   Laban Emperor. Zehr  03/20/2019, 12:02 PM   Attending physician's note   I have taken an interval history, reviewed the chart and examined the patient. I agree with the Advanced Practitioner's note, impression and recommendations.   53 year old female with large cratered pyloric channel ulcer status post GDA embolization by IR  Hemoglobin remained stable Continue PPI twice daily Carafate before meals and at bedtime Zofran before meals as needed Advance diet as tolerated Avoid NSAIDs  H .pylori treatment on discharge with Pylera for 10 days  Follow-up as  outpatient for surveillance EGD to document healing of ulcer and also to confirm eradication of H. Pylori  We will sign off, available if have any questions or concerns  K. Scherry RanVeena Amarii Amy , MD [phone]

## 2019-03-20 NOTE — Plan of Care (Signed)

## 2019-03-21 DIAGNOSIS — I959 Hypotension, unspecified: Secondary | ICD-10-CM

## 2019-03-21 DIAGNOSIS — D649 Anemia, unspecified: Secondary | ICD-10-CM

## 2019-03-21 LAB — CBC
HCT: 39.3 % (ref 36.0–46.0)
Hemoglobin: 12.6 g/dL (ref 12.0–15.0)
MCH: 29.7 pg (ref 26.0–34.0)
MCHC: 32.1 g/dL (ref 30.0–36.0)
MCV: 92.7 fL (ref 80.0–100.0)
Platelets: 292 10*3/uL (ref 150–400)
RBC: 4.24 MIL/uL (ref 3.87–5.11)
RDW: 14.5 % (ref 11.5–15.5)
WBC: 11.7 10*3/uL — ABNORMAL HIGH (ref 4.0–10.5)
nRBC: 0 % (ref 0.0–0.2)

## 2019-03-21 LAB — BASIC METABOLIC PANEL
Anion gap: 9 (ref 5–15)
BUN: <5 mg/dL — ABNORMAL LOW (ref 6–20)
CO2: 24 mmol/L (ref 22–32)
Calcium: 8.2 mg/dL — ABNORMAL LOW (ref 8.9–10.3)
Chloride: 107 mmol/L (ref 98–111)
Creatinine, Ser: 0.43 mg/dL — ABNORMAL LOW (ref 0.44–1.00)
GFR calc Af Amer: >60 mL/min (ref >60)
GFR calc non Af Amer: >60 mL/min (ref >60)
Glucose, Bld: 121 mg/dL — ABNORMAL HIGH (ref 70–99)
Potassium: 3.7 mmol/L (ref 3.5–5.1)
Sodium: 140 mmol/L (ref 135–145)

## 2019-03-21 MED ORDER — SUCRALFATE 1 GM/10ML PO SUSP: 1.0000 g | Freq: Three times a day (TID) | ORAL | 0 refills | Status: DC

## 2019-03-21 MED ORDER — DICYCLOMINE HCL 10 MG PO CAPS: 10.0000 mg | ORAL_CAPSULE | Freq: Three times a day (TID) | ORAL | 0 refills | Status: DC | PRN

## 2019-03-21 MED ORDER — ONDANSETRON HCL 4 MG PO TABS: 4.0000 mg | ORAL_TABLET | Freq: Four times a day (QID) | ORAL | 0 refills | Status: AC | PRN

## 2019-03-21 MED ORDER — PANTOPRAZOLE SODIUM 40 MG PO TBEC: 40.0000 mg | DELAYED_RELEASE_TABLET | Freq: Two times a day (BID) | ORAL | 1 refills | Status: DC

## 2019-03-21 MED ORDER — HYDROCODONE-ACETAMINOPHEN 5-325 MG PO TABS: 1.0000 | ORAL_TABLET | ORAL | 0 refills | Status: AC | PRN

## 2019-03-21 MED ORDER — PYLERA 140-125-125 MG PO CAPS: 3.0000 | ORAL_CAPSULE | Freq: Three times a day (TID) | ORAL | 0 refills | Status: DC

## 2019-03-21 NOTE — Plan of Care (Signed)

## 2019-03-21 NOTE — Plan of Care (Signed)

## 2019-03-21 NOTE — TOC Transition Note (Signed)
Transition of Care [facility]) - CM/SW Discharge Note   Patient Details  Name: REIS PIENTA MRN: [id number] Date of Birth: 25-Dec-1965  Transition of Care [facility]) CM/SW Contact:  Nila Nephew, LCSW Phone Number: weekend coverage [phone] 03/21/2019, 2:21 PM   Clinical Narrative:   Pt reports not being established with PCP in some time- uninsured currently. Educated about area options for primary care for uninsured patients - provided with contact information for [facility] and instructions to call Monday morning to schedule new patient hospital f/u appointment. Information in pt's AVS. Pt agreeable/gracious.                Social Determinants of Health (SDOH) Interventions     Readmission Risk Interventions No flowsheet data found.

## 2019-03-21 NOTE — Plan of Care (Signed)
  Problem: Education: Goal: Knowledge of General Education information will improve Description: Including pain rating scale, medication(s)/side effects and non-pharmacologic comfort measures 03/21/2019 1506 by Hubert Azure, RN Outcome: Adequate for Discharge 03/21/2019 1424 by Hubert Azure, RN Outcome: Progressing   Problem: Health Behavior/Discharge Planning: Goal: Ability to manage health-related needs will improve 03/21/2019 1506 by Hubert Azure, RN Outcome: Adequate for Discharge 03/21/2019 1424 by Hubert Azure, RN Outcome: Progressing   Problem: Clinical Measurements: Goal: Ability to maintain clinical measurements within normal limits will improve 03/21/2019 1506 by Hubert Azure, RN Outcome: Adequate for Discharge 03/21/2019 1424 by Hubert Azure, RN Outcome: Progressing Goal: Will remain free from infection 03/21/2019 1506 by Hubert Azure, RN Outcome: Adequate for Discharge 03/21/2019 1424 by Hubert Azure, RN Outcome: Progressing Goal: Diagnostic test results will improve 03/21/2019 1506 by Hubert Azure, RN Outcome: Adequate for Discharge 03/21/2019 1424 by Hubert Azure, RN Outcome: Progressing Goal: Respiratory complications will improve 03/21/2019 1506 by Hubert Azure, RN Outcome: Adequate for Discharge 03/21/2019 1424 by Hubert Azure, RN Outcome: Progressing Goal: Cardiovascular complication will be avoided 03/21/2019 1506 by Hubert Azure, RN Outcome: Adequate for Discharge 03/21/2019 1424 by Hubert Azure, RN Outcome: Progressing   Problem: Activity: Goal: Risk for activity intolerance will decrease Outcome: Adequate for Discharge   Problem: Nutrition: Goal: Adequate nutrition will be maintained Outcome: Adequate for Discharge   Problem: Coping: Goal: Level of anxiety will decrease Outcome: Adequate for Discharge   Problem: Elimination: Goal: Will not experience complications related to bowel motility Outcome: Adequate for  Discharge Goal: Will not experience complications related to urinary retention Outcome: Adequate for Discharge   Problem: Pain Managment: Goal: General experience of comfort will improve Outcome: Adequate for Discharge   Problem: Safety: Goal: Ability to remain free from injury will improve Outcome: Adequate for Discharge   Problem: Skin Integrity: Goal: Risk for impaired skin integrity will decrease Outcome: Adequate for Discharge

## 2019-03-21 NOTE — Discharge Summary (Signed)
Ashley [name] UQJ:[id number] DOB: [birth year] DOA: 03/15/2019  PCP: Patient, No Pcp Per  Admit date: 03/15/2019 Discharge date: 03/21/2019  Admitted From: Home  Disposition: Home  Recommendations for Outpatient Follow-up:  1. Follow up with PCP in 1-2 weeks.  GI to arrange follow-up, will need EGD surveillance in 2 months to confirm medication of H. pylori 2. Please obtain BMP/CBC in one week 3. New medications: Pylera x10 days, Protonix twice daily, Carafate,short prescription for hydrocodone/acetaminophen every 4 hours as needed x3 days.  Instructed to avoid NSAIDs   Home Health: No Equipment/Devices: None  Discharge Condition: Stable  CODE STATUS: Full code Diet recommendation: Liquid diet until able to tolerate p.o.  Brief/Interim Summary: History of present illness:  Ashley [name] is a 53 y.o. year old female with medical history significant for intermittent nausea/vomiting x several months, unintentional weight loss x several monthswho presented on 03/15/2019 with bright red blood per rectum and dark stool and abdominal painin setting of admitted NSAID use for several days and was found to have acute symptomatic anemia related to upper GI bleed.   ED Course: HR 115, BP 87/93, fecal occult positive, K 2.4, hgb 10.6, WBC 24.  Patient was given IV fluids and started on IV protonix due to witnessed large volume hematemesis in ED and concern for upper GIB.    A large cratered ulcer was found on EGD ( 9/13) with no active bleeding at that time but residual blood/blood clots seen in stomach.  She was transferred to ICU as she was intubated for EGD and continued to protect airway and minimize risk of aspiration.  IR proceeded with CTA and emergent angiography embolization .  She did require Levophed and bolus fluids for persistent hypotension as well as 2 U PRBC for hgb  Remaining hospital course addressed in problem based format below:   Hospital Course:    Acute upper gi bleed  secondary to large cratered gastric ulcer, in setting of NSAID use.  S/p IR coil embolization on 9/13.  After procedure hemoglobin was monitored while continuing IV Protonix twice daily.  Patient did have some minimal residual dark stool and abdominal pain but that was well controlled on oral pain regimen, for which she was provided a short prescription of opioids on discharge.  Hemoglobin has since normalized and patient able to tolerate full liquid diet, with instructions to advance to heart healthy diet on discharge.  She will continue Protonix twice daily, Carafate suspension 3 times daily, Bentyl and antiemetics PRN before meals,.  Patient instructed to avoid  NSAIDs.  On discharge provided Pylera for 10 days for treatment of H. pylori, with close outpatient follow-up for surveillance EGD (2 months with Dr. Kyla Balzarine confirm eradication of H. pylori per GI recommendations on discharge.   Acute on chronic normocytic anemia secondary to GI bleed. Nadir of 7.1 ( baseline 9-10). S/p 2 U during hospital course.  On discharge hemoglobin normalized to 12.6   History of EtoH abuse. No signs of withdrawal here    Hypokalemia and hypomagnesemia, resolved with repletion during hospital course   GERD, continue PPI.    Consultations:  IR, GI  Procedures/Studies:  Intubated 9/13, extubated 9/14  visceral arteriogram with embolization 9/13  EGD 9/13 Subjective: Having some occasional belly pain but feeling much better Tolerated grits this morning Discharge Exam: Vitals:   03/21/19 0542 03/21/19 1326  BP: 128/66 140/85  Pulse: 75 74  Resp: 16 16  Temp: 98.7 F (37.1 C) 99.3 F (37.4 C)  SpO2: 98% 100%   Vitals:   03/20/19 1830 03/20/19 2112 03/21/19 0542 03/21/19 1326  BP: 139/76 140/67 128/66 140/85  Pulse: 66 82 75 74  Resp: 15 16 16 16   Temp: 99.1 F (37.3 C) 99.6 F (37.6 C) 98.7 F (37.1 C) 99.3 F (37.4 C)  TempSrc: Oral Oral Oral Oral  SpO2: 98% 99% 98% 100%   Weight:      Height:        General: Lying in bed, no apparent distress Eyes: EOMI, anicteric ENT: Oral Mucosa clear and moist Cardiovascular: regular rate and rhythm, no murmurs, rubs or gallops, no edema, Respiratory: Normal respiratory effort, lungs clear to auscultation bilaterally Abdomen: soft, non-distended, non-tender, normal bowel sounds Skin: No Rash Neurologic: Grossly no focal neuro deficit.Mental status AAOx3, speech normal, Psychiatric:Appropriate affect, and mood  Discharge Diagnoses:  Active Problems:   Intractable nausea and vomiting   Upper GI bleed   Symptomatic anemia   Hypotension   Pyloric ulcer   Hypokalemia   Hypomagnesemia    Discharge Instructions  Discharge Instructions    Diet - low sodium heart healthy   Complete by: As directed    Increase activity slowly   Complete by: As directed      Allergies as of 03/21/2019   No Known Allergies     Medication List    STOP taking these medications   aspirin-acetaminophen-caffeine 250-250-65 MG tablet Commonly known as: EXCEDRIN MIGRAINE   nicotine 14 mg/24hr patch Commonly known as: NICODERM CQ - dosed in mg/24 hours   promethazine 12.5 MG tablet Commonly known as: PHENERGAN     TAKE these medications   dicyclomine 10 MG capsule Commonly known as: BENTYL Take 1 capsule (10 mg total) by mouth 3 (three) times daily as needed for spasms. What changed: when to take this   HYDROcodone-acetaminophen 5-325 MG tablet Commonly known as: NORCO/VICODIN Take 1 tablet by mouth every 4 (four) hours as needed for up to 3 days for severe pain.   ondansetron 4 MG tablet Commonly known as: ZOFRAN Take 1 tablet (4 mg total) by mouth every 6 (six) hours as needed for up to 10 days for nausea.   pantoprazole 40 MG tablet Commonly known as: PROTONIX Take 1 tablet (40 mg total) by mouth 2 (two) times daily. What changed: when to take this   Pylera 140-125-125 MG capsule Generic drug:  bismuth-metronidazole-tetracycline Take 3 capsules by mouth 4 (four) times daily -  before meals and at bedtime for 10 days.   sucralfate 1 GM/10ML suspension Commonly known as: CARAFATE Take 10 mLs (1 g total) by mouth 4 (four) times daily -  before meals and at bedtime.       No Known Allergies      The results of significant diagnostics from this hospitalization (including imaging, microbiology, ancillary and laboratory) are listed below for reference.     Microbiology: Recent Results (from the past 240 hour(s))  SARS Coronavirus 2 [facility] order, Performed in [facility] hospital lab) Nasopharyngeal Nasopharyngeal Swab     Status: None   Collection Time: 03/15/19  6:32 AM   Specimen: Nasopharyngeal Swab  Result Value Ref Range Status   SARS Coronavirus 2 NEGATIVE NEGATIVE Final    Comment: (NOTE) If result is NEGATIVE SARS-CoV-2 target nucleic acids are NOT DETECTED. The SARS-CoV-2 RNA is generally detectable in upper and lower  respiratory specimens during the acute phase of infection. The lowest  concentration of SARS-CoV-2 viral copies this assay can detect is  250  copies / mL. A negative result does not preclude SARS-CoV-2 infection  and should not be used as the sole basis for treatment or other  patient management decisions.  A negative result may occur with  improper specimen collection / handling, submission of specimen other  than nasopharyngeal swab, presence of viral mutation(s) within the  areas targeted by this assay, and inadequate number of viral copies  (<250 copies / mL). A negative result must be combined with clinical  observations, patient history, and epidemiological information. If result is POSITIVE SARS-CoV-2 target nucleic acids are DETECTED. The SARS-CoV-2 RNA is generally detectable in upper and lower  respiratory specimens dur ing the acute phase of infection.  Positive  results are indicative of active infection with SARS-CoV-2.  Clinical   correlation with patient history and other diagnostic information is  necessary to determine patient infection status.  Positive results do  not rule out bacterial infection or co-infection with other viruses. If result is PRESUMPTIVE POSTIVE SARS-CoV-2 nucleic acids MAY BE PRESENT.   A presumptive positive result was obtained on the submitted specimen  and confirmed on repeat testing.  While 2019 novel coronavirus  (SARS-CoV-2) nucleic acids may be present in the submitted sample  additional confirmatory testing may be necessary for epidemiological  and / or clinical management purposes  to differentiate between  SARS-CoV-2 and other Sarbecovirus currently known to infect humans.  If clinically indicated additional testing with an alternate test  methodology [phone]([id number]) is advised. The SARS-CoV-2 RNA is generally  detectable in upper and lower respiratory sp ecimens during the acute  phase of infection. The expected result is Negative. Fact Sheet for Patients:  BoilerBrush.com.cyhttps://www.fda.gov/media/136312/download Fact Sheet for Healthcare Providers: https://pope.com/https://www.fda.gov/media/136313/download This test is not yet approved or cleared by the [place] FDA and has been authorized for detection and/or diagnosis of SARS-CoV-2 by FDA under an Emergency Use Authorization (EUA).  This EUA will remain in effect (meaning this test can be used) for the duration of the COVID-19 declaration under Section 564(b)(1) of the Act, 21 U.S.C. section 360bbb-3(b)(1), unless the authorization is terminated or revoked sooner. Performed at [facility], [address] [place], [place] [address]   MRSA PCR Screening     Status: None   Collection Time: 03/15/19 11:29 AM   Specimen: Nasal Mucosa; Nasopharyngeal  Result Value Ref Range Status   MRSA by PCR NEGATIVE NEGATIVE Final    Comment:        The GeneXpert MRSA Assay (FDA approved for NASAL specimens only), is one component of  a comprehensive MRSA colonization surveillance program. It is not intended to diagnose MRSA infection nor to guide or monitor treatment for MRSA infections. Performed at [facility], [address] [place], [place] [address]      Labs: BNP (last 3 results) No results for input(s): BNP in the last 8760 hours. Basic Metabolic Panel: Recent Labs  Lab 03/15/19 0524  03/16/19 0302 03/16/19 0807 03/17/19 0438 03/18/19 0457 03/19/19 0234 03/21/19 0239  NA 134*   < > 143  --  143 137 141 140  K 2.4*   < > 5.6* 4.4 4.0 3.3* 3.9 3.7  CL 103   < > 119*  --  117* 109 109 107  CO2 17*   < > 16*  --  20* 21* 23 24  GLUCOSE 131*   < > 199*  --  98 118* 118* 121*  BUN 15   < > 10  --  27* 8 <5* <5*  CREATININE 0.84   < > 0.71  --  0.46 0.42* 0.39* 0.43*  CALCIUM 8.3*   < > 6.9*  --  7.8* 8.0* 8.1* 8.2*  MG 2.1  --   --   --   --  1.6* 2.2  --    < > = values in this interval not displayed.   Liver Function Tests: Recent Labs  Lab 03/15/19 0524 03/16/19 0302  AST 22 27  ALT 13 20  ALKPHOS 128* 58  BILITOT 0.4 0.9  PROT 6.8 3.8*  ALBUMIN 3.5 2.2*   No results for input(s): LIPASE, AMYLASE in the last 168 hours. No results for input(s): AMMONIA in the last 168 hours. CBC: Recent Labs  Lab 03/15/19 2008  03/17/19 4174  03/18/19 0457 03/18/19 1138 03/19/19 0234 03/20/19 0235 03/21/19 0239  WBC 12.5*   < > 12.7*  --  12.1*  --  10.7* 12.3* 11.7*  NEUTROABS 10.6*  --   --   --   --   --   --   --   --   HGB 16.3*   < > 12.0   < > 11.9* 12.6 13.6 12.7 12.6  HCT 48.3*   < > 36.4  --  35.6*  --  40.5 38.4 39.3  MCV 88.6   < > 91.9  --  91.8  --  89.4 92.8 92.7  PLT 103*   < > 129*  --  162  --  134* 252 292   < > = values in this interval not displayed.   Cardiac Enzymes: No results for input(s): CKTOTAL, CKMB, CKMBINDEX, TROPONINI in the last 168 hours. BNP: Invalid input(s): POCBNP CBG: Recent Labs  Lab 03/17/19 2308 03/18/19 0307 03/18/19 0754  03/18/19 1115 03/18/19 1525  GLUCAP 106* 101* 106* 134* 101*   D-Dimer No results for input(s): DDIMER in the last 72 hours. Hgb A1c No results for input(s): HGBA1C in the last 72 hours. Lipid Profile No results for input(s): CHOL, HDL, LDLCALC, TRIG, CHOLHDL, LDLDIRECT in the last 72 hours. Thyroid function studies No results for input(s): TSH, T4TOTAL, T3FREE, THYROIDAB in the last 72 hours.  Invalid input(s): FREET3 Anemia work up No results for input(s): VITAMINB12, FOLATE, FERRITIN, TIBC, IRON, RETICCTPCT in the last 72 hours. Urinalysis    Component Value Date/Time   COLORURINE STRAW (A) 02/25/2019 0815   APPEARANCEUR CLEAR (A) 02/25/2019 0815   LABSPEC 1.008 02/25/2019 0815   PHURINE 6.0 02/25/2019 0815   GLUCOSEU NEGATIVE 02/25/2019 0815   HGBUR NEGATIVE 02/25/2019 0815   BILIRUBINUR NEGATIVE 02/25/2019 0815   KETONESUR NEGATIVE 02/25/2019 0815   PROTEINUR NEGATIVE 02/25/2019 0815   UROBILINOGEN 0.2 08/18/2014 0900   NITRITE NEGATIVE 02/25/2019 0815   LEUKOCYTESUR NEGATIVE 02/25/2019 0815   Sepsis Labs Invalid input(s): PROCALCITONIN,  WBC,  LACTICIDVEN Microbiology Recent Results (from the past 240 hour(s))  SARS Coronavirus 2 [facility] order, Performed in [facility] hospital lab) Nasopharyngeal Nasopharyngeal Swab     Status: None   Collection Time: 03/15/19  6:32 AM   Specimen: Nasopharyngeal Swab  Result Value Ref Range Status   SARS Coronavirus 2 NEGATIVE NEGATIVE Final    Comment: (NOTE) If result is NEGATIVE SARS-CoV-2 target nucleic acids are NOT DETECTED. The SARS-CoV-2 RNA is generally detectable in upper and lower  respiratory specimens during the acute phase of infection. The lowest  concentration of SARS-CoV-2 viral copies this assay can detect is 250  copies / mL. A negative  result does not preclude SARS-CoV-2 infection  and should not be used as the sole basis for treatment or other  patient management decisions.  A negative result may occur  with  improper specimen collection / handling, submission of specimen other  than nasopharyngeal swab, presence of viral mutation(s) within the  areas targeted by this assay, and inadequate number of viral copies  (<250 copies / mL). A negative result must be combined with clinical  observations, patient history, and epidemiological information. If result is POSITIVE SARS-CoV-2 target nucleic acids are DETECTED. The SARS-CoV-2 RNA is generally detectable in upper and lower  respiratory specimens dur ing the acute phase of infection.  Positive  results are indicative of active infection with SARS-CoV-2.  Clinical  correlation with patient history and other diagnostic information is  necessary to determine patient infection status.  Positive results do  not rule out bacterial infection or co-infection with other viruses. If result is PRESUMPTIVE POSTIVE SARS-CoV-2 nucleic acids MAY BE PRESENT.   A presumptive positive result was obtained on the submitted specimen  and confirmed on repeat testing.  While 2019 novel coronavirus  (SARS-CoV-2) nucleic acids may be present in the submitted sample  additional confirmatory testing may be necessary for epidemiological  and / or clinical management purposes  to differentiate between  SARS-CoV-2 and other Sarbecovirus currently known to infect humans.  If clinically indicated additional testing with an alternate test  methodology [phone]([id number]) is advised. The SARS-CoV-2 RNA is generally  detectable in upper and lower respiratory sp ecimens during the acute  phase of infection. The expected result is Negative. Fact Sheet for Patients:  BoilerBrush.com.cyhttps://www.fda.gov/media/136312/download Fact Sheet for Healthcare Providers: https://pope.com/https://www.fda.gov/media/136313/download This test is not yet approved or cleared by the [place] FDA and has been authorized for detection and/or diagnosis of SARS-CoV-2 by FDA under an Emergency Use Authorization (EUA).  This EUA  will remain in effect (meaning this test can be used) for the duration of the COVID-19 declaration under Section 564(b)(1) of the Act, 21 U.S.C. section 360bbb-3(b)(1), unless the authorization is terminated or revoked sooner. Performed at [facility], [address] [place], [place] [address]   MRSA PCR Screening     Status: None   Collection Time: 03/15/19 11:29 AM   Specimen: Nasal Mucosa; Nasopharyngeal  Result Value Ref Range Status   MRSA by PCR NEGATIVE NEGATIVE Final    Comment:        The GeneXpert MRSA Assay (FDA approved for NASAL specimens only), is one component of a comprehensive MRSA colonization surveillance program. It is not intended to diagnose MRSA infection nor to guide or monitor treatment for MRSA infections. Performed at [facility], [address] [place], [place] [address]      Time coordinating discharge: Over 30 minutes  SIGNED:   Laverna PeaceShayla D Itza Maniaci, MD  [facility] 03/21/2019, 2:07 PM Pager   If 7PM-7AM, please contact night-coverage [email or web address] Password [email or web address]

## 2019-03-21 NOTE — Discharge Instructions (Signed)
[facility]- Please call Monday to schedule hospital follow-up appointment Address: [address], [place], [place] [address] Opens 8:30AM Mon Phone: [phone]

## 2019-03-24 ENCOUNTER — Telehealth: Payer: Self-pay

## 2019-03-24 NOTE — Telephone Encounter (Signed)
Message received from Nancy Marus, RN CM requesting a hospital follow up appointment for the patient.  Attempted to contact patient # [phone] and phone rings busy. Call placed to # [phone], message left requesting a call back to this CM # [phone]. There are appointments available at [facility]

## 2019-04-01 NOTE — Progress Notes (Signed)
Virtual Visit via Telephone Note  I connected with Ashley [name] on 04/01/19 at  1:50 PM EDT by telephone and verified that I am speaking with the correct person using two identifiers.   I discussed the limitations, risks, security and privacy concerns of performing an evaluation and management service by telephone and the availability of in person appointments. I also discussed with the patient that there may be a patient responsible charge related to this service. The patient expressed understanding and agreed to proceed.  Patient location:  Home My Location:  home office Persons on the call:  Me and the patient    History of Present Illness: After hospitalization for GI bleed 9/13-9/19/2020.  Still eating bland diet. She did not get Pylera bc it was ~$900.  No further blood in stool.  Not taking NSAIDS.  Says abdomen is cramping at times but not "painful." She has not drank alcohol since January.  She has not scheduled her gastroenterology appt.  Appetite is good but not eating much.  From discharge summary: Recommendations for Outpatient Follow-up:  1. Follow up with PCP in 1-2 weeks.  GI to arrange follow-up, will need EGD surveillance in 2 months to confirm medication of H. pylori 2. Please obtain BMP/CBC in one week 3. New medications: Pylera x10 days, Protonix twice daily, Carafate,short prescription for hydrocodone/acetaminophen every 4 hours as needed x3 days.  Instructed to avoid NSAIDs   Home Health: No Equipment/Devices: None  Discharge Condition: Stable  CODE STATUS: Full code Diet recommendation: Liquid diet until able to tolerate p.o.  Brief/Interim Summary: History of present illness:  Ashley [name] a 53 y.o.year old femalewith medical history significant for intermittent nausea/vomiting x several months, unintentional weight loss x several monthswho presented on 9/13/2020with bright red blood per rectum and dark stool and abdominal painin setting of  admitted NSAID use for several days and was found to have acute symptomatic anemia related to upper GI bleed.   ED Course: HR 115, BP 87/93, fecal occult positive, K 2.4, hgb 10.6, WBC 24. Patient was given IV fluids and started on IV protonix due to witnessed large volume hematemesis in ED and concern for upper GIB.   A large cratered ulcer was found on EGD ( 9/13) with no active bleeding at that time but residual blood/blood clots seen in stomach.  She was transferred to ICU as she was intubated for EGD and continued to protect airway and minimize risk of aspiration. IR proceeded with CTA and emergent angiography embolization . She did require Levophed and bolus fluids for persistent hypotension as well as 2 U PRBC for hgb  Remaining hospital course addressed in problem based format below:   Hospital Course:    Acute upper gi bleed secondary to large cratered gastric ulcer, in setting of NSAID use.  S/p IR coil embolization on 9/13.  After procedure hemoglobin was monitored while continuing IV Protonix twice daily.  Patient did have some minimal residual dark stool and abdominal pain but that was well controlled on oral pain regimen, for which she was provided a short prescription of opioids on discharge.  Hemoglobin has since normalized and patient able to tolerate full liquid diet, with instructions to advance to heart healthy diet on discharge.  She will continue Protonix twice daily, Carafate suspension 3 times daily, Bentyl and antiemetics PRN before meals,.  Patient instructed to avoid  NSAIDs.  On discharge provided Pylera for 10 days for treatment of H. pylori, with close outpatient follow-up for  surveillance EGD(2 months with Dr. Gerilyn Pilgrim confirm eradication of H. pylori per GI recommendations on discharge.   Acute on chronic normocytic anemia secondary to GI bleed. Nadir of 7.1 ( baseline 9-10). S/p 2 U during hospital course.  On discharge hemoglobin normalized to  12.6   History of EtoH abuse. No signs of withdrawal here    Hypokalemia and hypomagnesemia, resolved with repletion during hospital course   GERD, continue PPI.    Observations/Objective:  NAD.  A&Ox3   Assessment and Plan: 1. Symptomatic anemia - CBC with Differential/Platelet; Future - Ambulatory referral to Gastroenterology  2. Upper GI bleed - CBC with Differential/Platelet; Future - Ambulatory referral to Gastroenterology  3. Encounter for examination following treatment at hospital  4. Pylorus ulcer, unspecified chronicity She could not afford regimen.  Regimen changes- - Basic metabolic panel; Future - amoxicillin (AMOXIL) 500 MG capsule; Take 1 capsule (500 mg total) by mouth 2 (two) times daily.  Dispense: 56 capsule; Refill: 0 - clarithromycin (BIAXIN) 500 MG tablet; Take 1 tablet (500 mg total) by mouth 2 (two) times daily.  Dispense: 28 tablet; Refill: 0 - omeprazole (PRILOSEC) 20 MG capsule; Take 1 capsule (20 mg total) by mouth daily.  Dispense: 30 capsule; Refill: 3 - fluconazole (DIFLUCAN) 150 MG tablet; Take 1 tablet (150 mg total) by mouth once for 1 dose.  Dispense: 1 tablet; Refill: 0 -apply for orange card.    5. Insomnia, unspecified type - traZODone (DESYREL) 50 MG tablet; Take 0.5-1 tablets (25-50 mg total) by mouth at bedtime as needed for sleep.  Dispense: 30 tablet; Refill: 3   Follow Up Instructions: Assign PCP in 1 month    I discussed the assessment and treatment plan with the patient. The patient was provided an opportunity to ask questions and all were answered. The patient agreed with the plan and demonstrated an understanding of the instructions.   The patient was advised to call back or seek an in-person evaluation if the symptoms worsen or if the condition fails to improve as anticipated.  I provided 12 minutes of non-face-to-face time during this encounter.   Georgian Co, PA-C  Patient ID: Ashley [name], female   DOB:  Nov 03, 1965, 53 y.o.   MRN: [id number]

## 2019-04-02 ENCOUNTER — Ambulatory Visit: Payer: Self-pay | Attending: Family Medicine | Admitting: Physician Assistant

## 2019-04-02 DIAGNOSIS — Z09 Encounter for follow-up examination after completed treatment for conditions other than malignant neoplasm: Secondary | ICD-10-CM

## 2019-04-02 DIAGNOSIS — G4700 Insomnia, unspecified: Secondary | ICD-10-CM

## 2019-04-02 DIAGNOSIS — D649 Anemia, unspecified: Secondary | ICD-10-CM

## 2019-04-02 DIAGNOSIS — K922 Gastrointestinal hemorrhage, unspecified: Secondary | ICD-10-CM

## 2019-04-02 DIAGNOSIS — K259 Gastric ulcer, unspecified as acute or chronic, without hemorrhage or perforation: Secondary | ICD-10-CM

## 2019-04-02 MED ORDER — CLARITHROMYCIN 500 MG PO TABS: 500.0000 mg | ORAL_TABLET | Freq: Two times a day (BID) | ORAL | 0 refills | Status: DC

## 2019-04-02 MED ORDER — AMOXICILLIN 500 MG PO CAPS: 500.0000 mg | ORAL_CAPSULE | Freq: Two times a day (BID) | ORAL | 0 refills | Status: DC

## 2019-04-02 MED ORDER — OMEPRAZOLE 20 MG PO CPDR: 20.0000 mg | DELAYED_RELEASE_CAPSULE | Freq: Every day | ORAL | 3 refills | Status: DC

## 2019-04-02 MED ORDER — FLUCONAZOLE 150 MG PO TABS: 150.0000 mg | ORAL_TABLET | Freq: Once | ORAL | 0 refills | Status: AC

## 2019-04-02 MED ORDER — TRAZODONE HCL 50 MG PO TABS: 25.0000 mg | ORAL_TABLET | Freq: Every evening | ORAL | 3 refills | Status: DC | PRN

## 2019-04-02 MED FILL — FLUCONAZOLE 150 MG TABLET: 150 | 1 days supply | Qty: 1 | Fill #0

## 2019-04-02 MED FILL — CLARITHROMYCIN 500 MG TAB: 500 | 14 days supply | Qty: 28 | Fill #0

## 2019-04-02 MED FILL — AMOXICILLIN 500 MG CAPSULE: 500 | 28 days supply | Qty: 56 | Fill #0

## 2019-04-02 MED FILL — traZODone HCL 50 MG TABS: 50 | 30 days supply | Qty: 30 | Fill #0

## 2019-04-02 MED FILL — OMEPRAZOLE 20 MG CAP: 20 | 30 days supply | Qty: 30 | Fill #0

## 2019-04-06 ENCOUNTER — Other Ambulatory Visit: Payer: Self-pay

## 2019-04-13 ENCOUNTER — Ambulatory Visit: Payer: Self-pay | Attending: Family Medicine

## 2019-04-13 ENCOUNTER — Ambulatory Visit: Payer: Self-pay | Admitting: Physician Assistant

## 2019-04-13 ENCOUNTER — Ambulatory Visit: Payer: Self-pay

## 2019-04-13 ENCOUNTER — Other Ambulatory Visit: Payer: Self-pay

## 2019-04-13 DIAGNOSIS — K922 Gastrointestinal hemorrhage, unspecified: Secondary | ICD-10-CM

## 2019-04-13 DIAGNOSIS — K259 Gastric ulcer, unspecified as acute or chronic, without hemorrhage or perforation: Secondary | ICD-10-CM

## 2019-04-13 DIAGNOSIS — D649 Anemia, unspecified: Secondary | ICD-10-CM

## 2019-04-14 LAB — CBC WITH DIFFERENTIAL/PLATELET
Basophils Absolute: 0.1 10*3/uL (ref 0.0–0.2)
Basos: 1 %
EOS (ABSOLUTE): 0.1 10*3/uL (ref 0.0–0.4)
Eos: 1 %
Hematocrit: 42.2 % (ref 34.0–46.6)
Hemoglobin: 13.9 g/dL (ref 11.1–15.9)
Immature Grans (Abs): 0 10*3/uL (ref 0.0–0.1)
Immature Granulocytes: 0 %
Lymphocytes Absolute: 3 10*3/uL (ref 0.7–3.1)
Lymphs: 30 %
MCH: 29.4 pg (ref 26.6–33.0)
MCHC: 32.9 g/dL (ref 31.5–35.7)
MCV: 89 fL (ref 79–97)
Monocytes Absolute: 1 10*3/uL — ABNORMAL HIGH (ref 0.1–0.9)
Monocytes: 10 %
Neutrophils Absolute: 5.7 10*3/uL (ref 1.4–7.0)
Neutrophils: 58 %
Platelets: 252 10*3/uL (ref 150–450)
RBC: 4.73 x10E6/uL (ref 3.77–5.28)
RDW: 13.2 % (ref 11.7–15.4)
WBC: 10 10*3/uL (ref 3.4–10.8)

## 2019-04-14 LAB — BASIC METABOLIC PANEL
BUN/Creatinine Ratio: 15 (ref 9–23)
BUN: 9 mg/dL (ref 6–24)
CO2: 24 mmol/L (ref 20–29)
Calcium: 9.5 mg/dL (ref 8.7–10.2)
Chloride: 104 mmol/L (ref 96–106)
Creatinine, Ser: 0.61 mg/dL (ref 0.57–1.00)
GFR calc Af Amer: 121 mL/min/{1.73_m2} (ref >59)
GFR calc non Af Amer: 105 mL/min/{1.73_m2} (ref >59)
Glucose: 94 mg/dL (ref 65–99)
Potassium: 3.9 mmol/L (ref 3.5–5.2)
Sodium: 142 mmol/L (ref 134–144)

## 2019-04-22 ENCOUNTER — Telehealth: Payer: Self-pay | Admitting: *Deleted

## 2019-04-22 NOTE — Telephone Encounter (Signed)
Patient has question regarding pain and discomfort that is returning. "Feels like it's starting again".   She continues to have burning in stomach. Does not drink alcohol and refrains from acidic foods. Has completed 1 of 2 ATB's and taking omeprazole. Scheduled an appointment in November for a follow up appointment.    Please advise.

## 2019-04-22 NOTE — Telephone Encounter (Signed)
Spoke to patient. She has seen the GI specialist already. Really hopes she can qualify for the orange card. She was denied medicaid. Symptoms are manageable at the moment but is afraid that if flares up again what options she will have.   Reminded of the upcoming appointments with [facility] Encouraged to monitor diet intake.  Patient verbalized understanding

## 2019-04-22 NOTE — Telephone Encounter (Signed)
A referral was already placed for gastroenterology.  She needs to follow-up with them for this problem(GI bleed, ulcers) as there is nothing further we can offer to treat it.  Continue the omeprazole.  Thanks, Freeman Caldron, PA-C

## 2019-04-23 ENCOUNTER — Ambulatory Visit: Payer: Self-pay | Admitting: Physician Assistant

## 2019-04-27 MED FILL — OMEPRAZOLE 20 MG CAP: 20 | 30 days supply | Qty: 30 | Fill #1

## 2019-04-27 MED FILL — traZODone HCL 50 MG TABS: 50 | 30 days supply | Qty: 30 | Fill #1

## 2019-04-28 ENCOUNTER — Other Ambulatory Visit: Payer: Self-pay

## 2019-04-28 ENCOUNTER — Ambulatory Visit: Payer: Self-pay | Attending: Family Medicine

## 2019-05-04 ENCOUNTER — Telehealth: Payer: Self-pay | Admitting: General Practice

## 2019-05-04 NOTE — Telephone Encounter (Signed)
Patient called to check and see if patient also applied for CAFA when she came in October. Please f/u

## 2019-05-04 NOTE — Telephone Encounter (Signed)
Pt was informed that the CAFA approval letter will take 3 to 4 weeks to be review

## 2019-05-20 ENCOUNTER — Ambulatory Visit: Payer: Medicaid Other | Admitting: Family Medicine

## 2019-05-25 MED FILL — ?OMEPRAZOLE 20MG CAP DR: 20 | 30 days supply | Qty: 30 | Fill #2

## 2019-05-25 MED FILL — ?traZODONE HCL 50MG TAB: 50 | 30 days supply | Qty: 30 | Fill #2

## 2019-06-10 ENCOUNTER — Other Ambulatory Visit: Payer: Self-pay

## 2019-06-10 ENCOUNTER — Telehealth: Payer: Self-pay | Admitting: Physician Assistant

## 2019-06-10 ENCOUNTER — Ambulatory Visit (INDEPENDENT_AMBULATORY_CARE_PROVIDER_SITE_OTHER): Payer: Self-pay | Admitting: Physician Assistant

## 2019-06-10 ENCOUNTER — Encounter: Payer: Self-pay | Admitting: Physician Assistant

## 2019-06-10 VITALS — BP 162/82 | HR 80 | Temp 98.5°F | Ht 63.0 in | Wt 151.0 lb

## 2019-06-10 DIAGNOSIS — Z1159 Encounter for screening for other viral diseases: Secondary | ICD-10-CM

## 2019-06-10 DIAGNOSIS — R1012 Left upper quadrant pain: Secondary | ICD-10-CM

## 2019-06-10 DIAGNOSIS — Z8711 Personal history of peptic ulcer disease: Secondary | ICD-10-CM

## 2019-06-10 DIAGNOSIS — Z8719 Personal history of other diseases of the digestive system: Secondary | ICD-10-CM

## 2019-06-10 MED ORDER — AMBULATORY NON FORMULARY MEDICATION: 0 refills | Status: DC

## 2019-06-10 MED ORDER — OMEPRAZOLE 40 MG PO CPDR: 40.0000 mg | DELAYED_RELEASE_CAPSULE | Freq: Two times a day (BID) | ORAL | 3 refills | Status: DC

## 2019-06-10 NOTE — Progress Notes (Signed)
Dr. Arm also wants CBC check given occasional black stools. Please order and let pt know. Thanks-JLL

## 2019-06-10 NOTE — Progress Notes (Signed)
Agree with assessment as outlined. Ashley [name] would also recommend checking a CBC to ensure stable Hgb if she has had occasional dark stools. If she feels worse in the interim despite recommendations she needs to let us know. Thanks

## 2019-06-10 NOTE — Patient Instructions (Addendum)
If you are age 51 or older, your body mass index should be between 23-30. Your Body mass index is 26.75 kg/m. If this is out of the aforementioned range listed, please consider follow up with your Primary Care Provider.  If you are age 58 or younger, your body mass index should be between 19-25. Your Body mass index is 26.75 kg/m. If this is out of the aformentioned range listed, please consider follow up with your Primary Care Provider.   You have been scheduled for an endoscopy. Please follow written instructions given to you at your visit today. If you use inhalers (even only as needed), please bring them with you on the day of your procedure. Your physician has requested that you go to www.startemmi.com and enter the access code given to you at your visit today. This web site gives a general overview about your procedure. However, you should still follow specific instructions given to you by our office regarding your preparation for the procedure.  We have sent the following medications to your pharmacy for you to pick up at your convenience: Omeprazole 40 mg - [place] [facility] [phone]  Use Tylenol for acute pain.  You are being referred to Dr. Billey Gosling at [facility]  Call [phone] for an appointment.  Thank you for choosing me and [facility].    Ellouise Newer, PA-C

## 2019-06-10 NOTE — Progress Notes (Signed)
Chief Complaint: History of GI bleed, history of H. pylori  HPI:    Ashley [name] is a 53 year old female with a past medical history as listed below, known to Dr. Adela LankArmbruster, who was referred to me by Anders SimmondsMcClung, Angela M, PA-C for follow-up after hospitalization for GI bleed with finding of H. pylori.    03/15/2019 EGD with esophagitis, clotted blood in the entire stomach, multiple clean-based gastric ulcers, large cratered ulcer at the pylorus extending into the bulb with adherent clot.  Biopsies positive for H. pylori.  Recommended patient have repeat EGD in 2 months.    04/02/2019 patient seen by her PCP for follow-up after recent hospitalization for GI bleed 9/13-9/19/2020, at that time and not taken Pylera yet because it was $900.  Abdominal pain is cramping at times but not painful.  At that time patient was given amoxicillin, clarithromycin, omeprazole and fluconazole.  At time of discharge from the hospital patient was recommended to have repeat EGD in 2 months to confirm eradication of H. Pylori.    04/13/2019 CMP normal.  CBC with a normal hemoglobin at 13.9.  White count normal.    Today, the patient tells me that she finished her medication for H. pylori about 3 weeks ago and had no pain at that time but slowly she has started back with what she describes as a left upper quadrant/epigastric pain rated as a 8-9/10 at times for which she has been using Ibuprofen sometimes (tells me that when she left the hospital she was told not to use Excedrin or Tylenol) 3 tabs 3 times a day over the past couple of weeks.  Has also continued on her Omeprazole but is unsure what dose this is.  Tells me that she feels like some of this pain may be anxiety related and she has also been losing sleep.  She does not follow with a primary care provider but does tell me she just got her current assistance card.  Also describes occasional black tarry looking stools "here and there over the past couple of weeks.    Denies  fever, chills, weight loss, nausea or vomiting.  Past Medical History:  Diagnosis Date  . GERD (gastroesophageal reflux disease)     Past Surgical History:  Procedure Laterality Date  . ESOPHAGOGASTRODUODENOSCOPY (EGD) WITH PROPOFOL N/A 03/15/2019   Procedure: ESOPHAGOGASTRODUODENOSCOPY (EGD) WITH PROPOFOL;  Surgeon: Benancio DeedsArmbruster, Steven P, MD;  Location: [facility];  Service: Gastroenterology;  Laterality: N/A;  . IR ANGIOGRAM SELECTIVE EACH ADDITIONAL VESSEL  03/15/2019  . IR ANGIOGRAM SELECTIVE EACH ADDITIONAL VESSEL  03/15/2019  . IR ANGIOGRAM VISCERAL SELECTIVE  03/15/2019  . IR EMBO ART  VEN HEMORR LYMPH EXTRAV  INC GUIDE ROADMAPPING  03/15/2019  . IR US GUIDE VASC ACCESS RIGHT  03/15/2019  . TUBAL LIGATION      Current Outpatient Medications  Medication Sig Dispense Refill  . amoxicillin (AMOXIL) 500 MG capsule Take 1 capsule (500 mg total) by mouth 2 (two) times daily. 56 capsule 0  . clarithromycin (BIAXIN) 500 MG tablet Take 1 tablet (500 mg total) by mouth 2 (two) times daily. 28 tablet 0  . omeprazole (PRILOSEC) 20 MG capsule Take 1 capsule (20 mg total) by mouth daily. 30 capsule 3  . traZODone (DESYREL) 50 MG tablet Take 0.5-1 tablets (25-50 mg total) by mouth at bedtime as needed for sleep. 30 tablet 3   No current facility-administered medications for this visit.     Allergies as of 06/10/2019  . (No  Known Allergies)    No family history on file.  Social History   Socioeconomic History  . Marital status: Divorced    Spouse name: Not on file  . Number of children: Not on file  . Years of education: Not on file  . Highest education level: Not on file  Occupational History  . Not on file  Social Needs  . Financial resource strain: Not on file  . Food insecurity    Worry: Not on file    Inability: Not on file  . Transportation needs    Medical: Not on file    Non-medical: Not on file  Tobacco Use  . Smoking status: Current Every Day Smoker    Types:  Cigarettes  . Smokeless tobacco: Never Used  Substance and Sexual Activity  . Alcohol use: No  . Drug use: No  . Sexual activity: Not on file  Lifestyle  . Physical activity    Days per week: Not on file    Minutes per session: Not on file  . Stress: Not on file  Relationships  . Social Musician on phone: Not on file    Gets together: Not on file    Attends religious service: Not on file    Active member of club or organization: Not on file    Attends meetings of clubs or organizations: Not on file    Relationship status: Not on file  . Intimate partner violence    Fear of current or ex partner: Not on file    Emotionally abused: Not on file    Physically abused: Not on file    Forced sexual activity: Not on file  Other Topics Concern  . Not on file  Social History Narrative  . Not on file    Review of Systems:    Constitutional: No weight loss, fever or chills Skin: No rash  Cardiovascular: No chest pain  Respiratory: No SOB Gastrointestinal: See HPI and otherwise negative   Physical Exam:  Vital signs: BP (!) 162/82   Pulse 80   Temp 98.5 F (36.9 C)   Ht 5\' 3"  (1.6 m)   Wt 151 lb (68.5 kg)   BMI 26.75 kg/m   Constitutional:   Pleasant Caucasian female appears to be in NAD, Well developed, Well nourished, alert and cooperative Respiratory: Respirations even and unlabored. Lungs clear to auscultation bilaterally.   No wheezes, crackles, or rhonchi.  Cardiovascular: Normal S1, S2. No MRG. Regular rate and rhythm. No peripheral edema, cyanosis or pallor.  Gastrointestinal:  Soft, nondistended, Moderate epigastric/LUQ ttp. No rebound or guarding. Normal bowel sounds. No appreciable masses or hepatomegaly. Rectal:  Not performed.  Msk:  Symmetrical without gross deformities. Without edema, no deformity or joint abnormality.Using crutches to ambulate  Psychiatric:  Demonstrates good judgement and reason without abnormal affect or behaviors.  MOST RECENT  LABS AND IMAGING: CBC    Component Value Date/Time   WBC 10.0 04/13/2019 1351   WBC 11.7 (H) 03/21/2019 0239   RBC 4.73 04/13/2019 1351   RBC 4.24 03/21/2019 0239   HGB 13.9 04/13/2019 1351   HCT 42.2 04/13/2019 1351   PLT 252 04/13/2019 1351   MCV 89 04/13/2019 1351   MCH 29.4 04/13/2019 1351   MCH 29.7 03/21/2019 0239   MCHC 32.9 04/13/2019 1351   MCHC 32.1 03/21/2019 0239   RDW 13.2 04/13/2019 1351   LYMPHSABS 3.0 04/13/2019 1351   MONOABS 0.5 03/15/2019 2008   EOSABS 0.1 04/13/2019  1351   BASOSABS 0.1 04/13/2019 1351    CMP     Component Value Date/Time   NA 142 04/13/2019 1351   K 3.9 04/13/2019 1351   CL 104 04/13/2019 1351   CO2 24 04/13/2019 1351   GLUCOSE 94 04/13/2019 1351   GLUCOSE 121 (H) 03/21/2019 0239   BUN 9 04/13/2019 1351   CREATININE 0.61 04/13/2019 1351   CALCIUM 9.5 04/13/2019 1351   PROT 3.8 (L) 03/16/2019 0302   ALBUMIN 2.2 (L) 03/16/2019 0302   AST 27 03/16/2019 0302   ALT 20 03/16/2019 0302   ALKPHOS 58 03/16/2019 0302   BILITOT 0.9 03/16/2019 0302   GFRNONAA 105 04/13/2019 1351   GFRAA 121 04/13/2019 1351    Assessment: 1.  Left upper quadrant/epigastric pain: Has returned and is increasing over the past 3 weeks, patient has been using Ibuprofen 3 tabs 3 times a day for the past week or so to help with this pain, does notice occasional black tarry stools, history of EGD in the hospital with multiple ulcers as well as positive for H. pylori has finished treatment; consider continued PUD 2.  History of GI bleed: Due to above 3.  History of H. pylori: Unable to afford initial Pylera prescribed at discharge, finished treatment just 3 weeks ago and since then pain has been increasing  Plan: 1.  Patient needs repeat EGD.  This was scheduled with Dr. Havery Moros in the [facility].  Did discuss risks, benefits, limitations and alternatives and patient agrees to proceed.  Patient be Covid tested 2 weeks prior time of exam. 2.  Increased Omeprazole to 40 mg  twice daily, 30-60 minutes before breakfast and dinner.  #60 with 3 refills. 3.  Prescribed GI cocktail, 5-10 mL every 4-6 hours as needed for pain. 4.  Discussed with the patient and reiterated that she should not be using any NSAIDs given her history of PUD.  Explained that this means no Ibuprofen, Aleve, Excedrin, Goody powders, BC powders etc.  She can use Tylenol as needed for pain if this helps. 5.  Sent a message to Dr. Quay Burow with [facility] [facility] at [facility] to see if she can get patient in within the next couple of weeks to establish care given her anxiety and insomnia. 6.  Patient to follow in clinic per recommendations from Dr. Havery Moros after time of procedure.  Ellouise Newer, PA-C [facility] 06/10/2019, 10:08 AM  Cc: Argentina Donovan, PA-C

## 2019-06-11 ENCOUNTER — Other Ambulatory Visit: Payer: Self-pay

## 2019-06-11 ENCOUNTER — Telehealth: Payer: Self-pay

## 2019-06-11 ENCOUNTER — Other Ambulatory Visit: Payer: Self-pay | Admitting: Physician Assistant

## 2019-06-11 ENCOUNTER — Telehealth: Payer: Self-pay | Admitting: General Practice

## 2019-06-11 DIAGNOSIS — Z8719 Personal history of other diseases of the digestive system: Secondary | ICD-10-CM

## 2019-06-11 DIAGNOSIS — F419 Anxiety disorder, unspecified: Secondary | ICD-10-CM

## 2019-06-11 MED ORDER — BUSPIRONE HCL 15 MG PO TABS: 15.0000 mg | ORAL_TABLET | Freq: Two times a day (BID) | ORAL | 2 refills | Status: DC

## 2019-06-11 MED ORDER — OMEPRAZOLE 40 MG PO CPDR: 40.0000 mg | DELAYED_RELEASE_CAPSULE | Freq: Two times a day (BID) | ORAL | 3 refills | Status: DC

## 2019-06-11 MED FILL — busPIRone HCL 15 MG TABS: 15 | 30 days supply | Qty: 60 | Fill #0

## 2019-06-11 MED FILL — OMEPRAZOLE DR 40 MG CAPSULE: 40 | 30 days supply | Qty: 60 | Fill #0

## 2019-06-11 NOTE — Telephone Encounter (Signed)
Spoke with patient regarding medication.  I told her Gerald Stabs had called but we do not have Gerald Stabs listed to give out health information.  She is requesting meds to be sent to [place] because she cant afford GI cocktail at [facility].   I explained to that [facility] compounds medications where other pharmacies do not.  She may not be able to get GI cocktail from [facility]. I resent both prescriptions to Ashley [name] per patients request.

## 2019-06-11 NOTE — Telephone Encounter (Signed)
Patient called stating she would like to be prescribed an Rx for anxiety. Patient has scheduled an appt for 06/17/19. If Rx is prescribed please send Rx to [facility] pharmacy. Please f/u

## 2019-06-11 NOTE — Telephone Encounter (Signed)
Patient called back and I let her know Ellouise Newer PA was not able to get her in with our Primary Care. Patient states she already got an appt. with the wellness clinic 06/17/19

## 2019-06-11 NOTE — Telephone Encounter (Signed)
-----   Message from Levin Erp, [place] sent at 06/11/2019 12:19 PM EST ----- Regarding: Please call patient Please call patient and let her know that I could not get her in to our primary care. She needs to go ahead and make an appt with Wellness clinic now so she can get in when they have an opening. THanks-JLL    ----- Message ----- From: Binnie Rail, MD Sent: 06/11/2019  10:51 AM EST To: Levin Erp, PA Subject: RE: New patient                                We are actually not allowed to take any new medicaid patients at this time.   Maybe she can try to get into the wellness clinic sooner - I think that is her best bet.   Sorry,  Billey Gosling ----- Message ----- From: Levin Erp, [place] Sent: 06/10/2019  10:56 AM EST To: Binnie Rail, MD Subject: New patient                                    Hello Dr. Quay Burow,    I saw this patient in clinic today, we had recently seen her in September for a GI bleed and we are setting her up for repeat EGD.  She has some other complaints today regarding insomnia and anxiety and tells me that she it is taking at least a month to get into the wellness clinic.  She has just received the [facility] health assistance card and is now able to see another PCP.  She has called around and is having trouble getting in with anyone.  Is there anyway could fit her into your schedule for a new patient appointment within the next couple of weeks?  Thank you, I would greatly appreciate it,   Ellouise Newer, PA-C  [facility]

## 2019-06-17 ENCOUNTER — Other Ambulatory Visit: Payer: Self-pay

## 2019-06-17 ENCOUNTER — Ambulatory Visit: Payer: Self-pay | Attending: Family Medicine | Admitting: Physician Assistant

## 2019-06-17 DIAGNOSIS — F419 Anxiety disorder, unspecified: Secondary | ICD-10-CM

## 2019-06-17 MED ORDER — FLUOXETINE HCL 20 MG PO TABS: 20.0000 mg | ORAL_TABLET | Freq: Every day | ORAL | 3 refills | Status: DC

## 2019-06-17 MED ORDER — BUSPIRONE HCL 15 MG PO TABS: 15.0000 mg | ORAL_TABLET | Freq: Two times a day (BID) | ORAL | 2 refills | Status: DC

## 2019-06-17 MED FILL — FLUoxetine HCL 20 MG CAPS: 20 | 30 days supply | Qty: 30 | Fill #0

## 2019-06-17 NOTE — Progress Notes (Signed)
Patient ID: Ashley [name], female   DOB: 1965/08/23, 53 y.o.   MRN: [id number] Virtual Visit via Telephone Note  I connected with [name] on 06/17/19 at  2:50 PM EST by telephone and verified that I am speaking with the correct person using two identifiers.   I discussed the limitations, risks, security and privacy concerns of performing an evaluation and management service by telephone and the availability of in person appointments. I also discussed with the patient that there may be a patient responsible charge related to this service. The patient expressed understanding and agreed to proceed.  Patient location:  home My Location:  [place] office Persons on the call:  Me and the patient  History of Present Illness:  Patient calling with anxiety and needs something "fast."  Having some burning pain in upper back and mid epigastric area.  Some nausea.  Stopped drinking in February but having a lot of anxiety since then.  I sent her a prescription for buspar last week.  She isn't taking this bc it didn't work immediately.  She is not doing any counseling/12 step recovery. She doesn't drive.  Denies SI/HI  Observations/Objective: NAD.  A&Ox3   Assessment and Plan: 1. Anxiety Advised 12 step recovery to aid in anxiety management and meds take a little while to get into system.   - busPIRone (BUSPAR) 15 MG tablet; Take 1 tablet (15 mg total) by mouth 2 (two) times daily.  Dispense: 60 tablet; Refill: 2 - FLUoxetine (PROZAC) 20 MG tablet; Take 1 tablet (20 mg total) by mouth daily.  Dispense: 30 tablet; Refill: 3 - Ambulatory referral to Social Work    Follow Up Instructions: Assign PCP in 1 month   I discussed the assessment and treatment plan with the patient. The patient was provided an opportunity to ask questions and all were answered. The patient agreed with the plan and demonstrated an understanding of the instructions.   The patient was advised to call back or seek an in-person  evaluation if the symptoms worsen or if the condition fails to improve as anticipated.  I provided 12 minutes of non-face-to-face time during this encounter.   Freeman Caldron, PA-C

## 2019-06-23 MED FILL — ?traZODONE HCL 50MG TAB: 50 | 30 days supply | Qty: 30 | Fill #3

## 2019-06-23 MED FILL — ?OMEPRAZOLE 20MG CAP DR: 20 | 30 days supply | Qty: 30 | Fill #3

## 2019-07-01 ENCOUNTER — Telehealth: Payer: Self-pay | Admitting: Gastroenterology

## 2019-07-01 ENCOUNTER — Encounter: Payer: Medicaid Other | Admitting: Gastroenterology

## 2019-07-10 ENCOUNTER — Telehealth: Payer: Self-pay | Admitting: Licensed Clinical Social Worker

## 2019-07-10 NOTE — Telephone Encounter (Signed)
Call placed to patient regarding [facility] referral. LCSW left message requesting a return call.  

## 2019-07-20 ENCOUNTER — Ambulatory Visit: Payer: Self-pay | Attending: Nurse Practitioner | Admitting: Nurse Practitioner

## 2019-07-20 ENCOUNTER — Other Ambulatory Visit: Payer: Self-pay

## 2019-07-21 ENCOUNTER — Other Ambulatory Visit: Payer: Self-pay | Admitting: Physician Assistant

## 2019-07-21 DIAGNOSIS — K259 Gastric ulcer, unspecified as acute or chronic, without hemorrhage or perforation: Secondary | ICD-10-CM

## 2019-07-21 MED FILL — FLUoxetine HCL 20 MG CAPS: 20 | 30 days supply | Qty: 30 | Fill #1

## 2019-07-21 MED FILL — busPIRone HCL 15 MG TABS: 15 | 30 days supply | Qty: 60 | Fill #0

## 2019-07-22 MED FILL — OMEPRAZOLE DR 40 MG CAPSULE: 40 | 30 days supply | Qty: 60 | Fill #1

## 2019-07-23 ENCOUNTER — Other Ambulatory Visit: Payer: Self-pay | Admitting: Physician Assistant

## 2019-07-23 DIAGNOSIS — G4700 Insomnia, unspecified: Secondary | ICD-10-CM

## 2019-07-30 ENCOUNTER — Telehealth: Payer: Self-pay | Admitting: Licensed Clinical Social Worker

## 2019-07-30 NOTE — Telephone Encounter (Signed)
Call placed to patient regarding [facility] referral. LCSW left message for a return call.  

## 2019-08-06 ENCOUNTER — Telehealth: Payer: Self-pay | Admitting: Licensed Clinical Social Worker

## 2019-08-06 ENCOUNTER — Other Ambulatory Visit: Payer: Self-pay

## 2019-08-06 ENCOUNTER — Encounter: Payer: Self-pay | Admitting: Nurse Practitioner

## 2019-08-06 ENCOUNTER — Ambulatory Visit: Payer: Self-pay | Attending: Nurse Practitioner | Admitting: Nurse Practitioner

## 2019-08-06 DIAGNOSIS — G8929 Other chronic pain: Secondary | ICD-10-CM

## 2019-08-06 DIAGNOSIS — Z7689 Persons encountering health services in other specified circumstances: Secondary | ICD-10-CM

## 2019-08-06 DIAGNOSIS — M5442 Lumbago with sciatica, left side: Secondary | ICD-10-CM

## 2019-08-06 DIAGNOSIS — F17200 Nicotine dependence, unspecified, uncomplicated: Secondary | ICD-10-CM

## 2019-08-06 DIAGNOSIS — I10 Essential (primary) hypertension: Secondary | ICD-10-CM

## 2019-08-06 DIAGNOSIS — F17210 Nicotine dependence, cigarettes, uncomplicated: Secondary | ICD-10-CM

## 2019-08-06 MED ORDER — HYDROCHLOROTHIAZIDE 25 MG PO TABS: 25.0000 mg | ORAL_TABLET | Freq: Every day | ORAL | 3 refills | Status: DC

## 2019-08-06 MED ORDER — PREGABALIN 50 MG PO CAPS: 50.0000 mg | ORAL_CAPSULE | Freq: Two times a day (BID) | ORAL | 0 refills | Status: DC

## 2019-08-06 MED FILL — PREGABALIN 50 MG CAPS: 50 | 30 days supply | Qty: 60 | Fill #0

## 2019-08-06 MED FILL — ?HYDROCHLOROTHIAZIDE 25MG T: 25 | 30 days supply | Qty: 30 | Fill #0

## 2019-08-06 NOTE — Progress Notes (Signed)
Virtual Visit via Telephone Note Due to national recommendations of social distancing due to [place] 19, telehealth visit is felt to be most appropriate for this patient at this time.  I discussed the limitations, risks, security and privacy concerns of performing an evaluation and management service by telephone and the availability of in person appointments. I also discussed with the patient that there may be a patient responsible charge related to this service. The patient expressed understanding and agreed to proceed.    I connected with Ashley [name] on 08/06/19  at   9:30 AM EST  EDT by telephone and verified that I am speaking with the correct person using two identifiers.   Consent I discussed the limitations, risks, security and privacy concerns of performing an evaluation and management service by telephone and the availability of in person appointments. I also discussed with the patient that there may be a patient responsible charge related to this service. The patient expressed understanding and agreed to proceed.   Location of Patient: Private Residence    Location of Provider: [place] [facility] [employer] Office    Persons participating in Telemedicine visit: Ashley Rankins FNP-BC [place]    History of Present Illness: Telemedicine visit for:  Establish Care She has not seen a PCP in years.   HEALTH MAINTENANCE Overdue for PAP smear and Mammogram: referred to breast clinic today for scholarship program   GERD HIstory of GI bleed and H pylori. Seeing GI and will need repeat EGD this month.  Recent EGD with esophagitis, clotted blood in the entire stomach, multiple clean-based gastric ulcers, large cratered ulcer at the pylorus extending into the bulb with adherent clot.  Biopsies positive for H. pylori. She is aware that she should not be using any NSAIDs.   Essential Hypertension States she has had problems with her blood pressure since she was  in the 6th grade but has never taken any blood pressure lowering medications for this. Denies chest pain, shortness of breath, palpitations, lightheadedness, dizziness, headaches or BLE edema. She does not monitor her blood pressure at home. Will start HCTZ 25 mg and have her follow up for BP recheck in a few weeks.   BP Readings from Last 3 Encounters:  06/10/19 (!) 162/82  03/21/19 140/85  02/25/19 (!) 150/77    Sciatica She has chronic back pain. Worse with prolonged sitting and standing. Using a walker to offset her back pain when she is mobile. She does not take any OTC medications for her back pain. Denies any involuntary loss of bowel or bladder. She endorses left sided sciatica. She can not take NSAIDs due to GI bleed.  Last MRI 11-2016:  Small right-sided disc protrusion L4-5 without stenosis or neural compression.. Using walker for mobility. She denies numbness, tingling and burning in the legs. Uses walker to offset the pain in her low back. IMPRESSION: Small right-sided disc protrusion L4-5 without stenosis or neural Compression.   Anxiety and Depression Takes prozac 20 mg daily and Buspar 15 mg BID. Denies any thoughts of self harm.     Past Medical History:  Diagnosis Date  . Anxiety   . Chronic left-sided back pain   . Depression   . GERD (gastroesophageal reflux disease)     Past Surgical History:  Procedure Laterality Date  . ESOPHAGOGASTRODUODENOSCOPY (EGD) WITH PROPOFOL N/A 03/15/2019   Procedure: ESOPHAGOGASTRODUODENOSCOPY (EGD) WITH PROPOFOL;  Surgeon: Yetta Flock, MD;  Location: [facility];  Service: Gastroenterology;  Laterality: N/A;  . IR ANGIOGRAM SELECTIVE EACH ADDITIONAL VESSEL  03/15/2019  . IR ANGIOGRAM SELECTIVE EACH ADDITIONAL VESSEL  03/15/2019  . IR ANGIOGRAM VISCERAL SELECTIVE  03/15/2019  . IR EMBO ART  VEN HEMORR LYMPH EXTRAV  INC GUIDE ROADMAPPING  03/15/2019  . IR US GUIDE VASC ACCESS RIGHT  03/15/2019  . TUBAL LIGATION      Family  History  Problem Relation Age of Onset  . Stomach cancer Mother   . Colon cancer Father   . Stomach cancer Sister   . Pancreatic cancer Neg Hx   . Esophageal cancer Neg Hx     Social History   Socioeconomic History  . Marital status: Divorced    Spouse name: Not on file  . Number of children: Not on file  . Years of education: Not on file  . Highest education level: Not on file  Occupational History  . Not on file  Tobacco Use  . Smoking status: Current Every Day Smoker    Types: Cigarettes  . Smokeless tobacco: Never Used  Substance and Sexual Activity  . Alcohol use: No  . Drug use: No  . Sexual activity: Yes  Other Topics Concern  . Not on file  Social History Narrative  . Not on file   Social Determinants of Health   Financial Resource Strain:   . Difficulty of Paying Living Expenses: Not on file  Food Insecurity:   . Worried About Programme researcher, broadcasting/film/video in the Last Year: Not on file  . Ran Out of Food in the Last Year: Not on file  Transportation Needs:   . Lack of Transportation (Medical): Not on file  . Lack of Transportation (Non-Medical): Not on file  Physical Activity:   . Days of Exercise per Week: Not on file  . Minutes of Exercise per Session: Not on file  Stress:   . Feeling of Stress : Not on file  Social Connections:   . Frequency of Communication with Friends and Family: Not on file  . Frequency of Social Gatherings with Friends and Family: Not on file  . Attends Religious Services: Not on file  . Active Member of Clubs or Organizations: Not on file  . Attends Banker Meetings: Not on file  . Marital Status: Not on file     Observations/Objective: Awake, alert and oriented x 3   Review of Systems  Constitutional: Negative for fever, malaise/fatigue and weight loss.  HENT: Negative.  Negative for nosebleeds.   Eyes: Negative.  Negative for blurred vision, double vision and photophobia.  Respiratory: Negative.  Negative for cough  and shortness of breath.   Cardiovascular: Negative.  Negative for chest pain, palpitations and leg swelling.  Gastrointestinal: Positive for heartburn. Negative for nausea and vomiting.  Genitourinary: Negative.   Musculoskeletal: Positive for back pain. Negative for myalgias.  Neurological: Positive for sensory change. Negative for dizziness, focal weakness, seizures and headaches.  Psychiatric/Behavioral: Positive for depression. Negative for suicidal ideas. The patient is nervous/anxious and has insomnia.     Assessment and Plan: Ashley [name] was seen today for establish care.  Diagnoses and all orders for this visit:  Encounter to establish care  Tobacco dependence Ashley [name] was counseled on the dangers of tobacco use, and was advised to quit. Reviewed strategies to maximize success, including removing cigarettes and smoking materials from environment, stress management and support of family/friends as well as pharmacological alternatives including: Wellbutrin, Chantix, Nicotine patch, Nicotine gum or lozenges. Smoking cessation support:  smoking cessation hotline: 1-800-QUIT-NOW.  Smoking cessation classes are also available through [facility]. Call [phone] or visit our website at [email or web address].   A total of 2 minutes was spent on counseling for smoking cessation and Ashley [name] is not ready to quit.   Chronic left-sided low back pain with left-sided sciatica -     pregabalin (LYRICA) 50 MG capsule; Take 1 capsule (50 mg total) by mouth 2 (two) times daily. Will need to see Neurosurgery and needs updated imaging Patient has been advised to apply for financial assistance and schedule to see our financial counselor.  Work on losing weight to help reduce back pain. May alternate with heat and ice application for pain relief. May also alternate with acetaminophen as prescribed for back pain. Other alternatives include massage, acupuncture and water aerobics.  You must  stay active and avoid a sedentary lifestyle.   Essential hypertension -     hydrochlorothiazide (HYDRODIURIL) 25 MG tablet; Take 1 tablet (25 mg total) by mouth daily. Continue all antihypertensives as prescribed.  Remember to bring in your blood pressure log with you for your follow up appointment.  DASH/Mediterranean Diets are healthier choices for HTN.       Follow Up Instructions Return in about 3 weeks (around 08/27/2019) for BP recheck.     I discussed the assessment and treatment plan with the patient. The patient was provided an opportunity to ask questions and all were answered. The patient agreed with the plan and demonstrated an understanding of the instructions.   The patient was advised to call back or seek an in-person evaluation if the symptoms worsen or if the condition fails to improve as anticipated.  I provided 19 minutes of non-face-to-face time during this encounter including median intraservice time, reviewing previous notes, labs, imaging, medications and explaining diagnosis and management.  Claiborne Rigg, FNP-BC

## 2019-08-06 NOTE — Telephone Encounter (Addendum)
Pt call was transferred to this MSW intern after telehealth encounter with NP Mayo Ao to follow up regarding anxiety symptoms noted during encounter with PA [facility] 06/16/20. Pt is scheduled for a Behavioral Health Consultation at [facility] 08/12/19 to further assess patient's needs and provide support.

## 2019-08-12 ENCOUNTER — Encounter: Payer: Self-pay | Admitting: Nurse Practitioner

## 2019-08-12 ENCOUNTER — Telehealth: Payer: Self-pay | Admitting: Licensed Clinical Social Worker

## 2019-08-12 ENCOUNTER — Ambulatory Visit: Payer: Self-pay | Attending: Nurse Practitioner | Admitting: Licensed Clinical Social Worker

## 2019-08-12 ENCOUNTER — Ambulatory Visit (HOSPITAL_BASED_OUTPATIENT_CLINIC_OR_DEPARTMENT_OTHER): Payer: Self-pay | Admitting: Nurse Practitioner

## 2019-08-12 ENCOUNTER — Other Ambulatory Visit: Payer: Self-pay

## 2019-08-12 DIAGNOSIS — G4700 Insomnia, unspecified: Secondary | ICD-10-CM

## 2019-08-12 DIAGNOSIS — K047 Periapical abscess without sinus: Secondary | ICD-10-CM

## 2019-08-12 DIAGNOSIS — F4323 Adjustment disorder with mixed anxiety and depressed mood: Secondary | ICD-10-CM

## 2019-08-12 MED ORDER — CHLORHEXIDINE GLUCONATE 0.12 % MT SOLN: 15.0000 mL | Freq: Two times a day (BID) | OROMUCOSAL | 0 refills | Status: DC

## 2019-08-12 MED ORDER — PENICILLIN V POTASSIUM 500 MG PO TABS: 500.0000 mg | ORAL_TABLET | Freq: Four times a day (QID) | ORAL | 0 refills | Status: AC

## 2019-08-12 MED ORDER — TRAZODONE HCL 50 MG PO TABS: 50.0000 mg | ORAL_TABLET | Freq: Every evening | ORAL | 3 refills | Status: DC | PRN

## 2019-08-12 MED FILL — ?PENICILLIN VK 500 MG TABLE: 500 | 7 days supply | Qty: 28 | Fill #0

## 2019-08-12 MED FILL — CHLORHEXIDINE 0.12% RINSE: 0.12 | 15 days supply | Qty: 473 | Fill #0

## 2019-08-12 MED FILL — ?TRAZODONE HCL 50 TABS: 50 | 30 days supply | Qty: 30 | Fill #0

## 2019-08-12 NOTE — Progress Notes (Signed)
Virtual Visit via Telephone Note [name] to national recommendations of social distancing [name] to COVID 19, telehealth visit is felt to be most appropriate for this patient at this time.  I discussed the limitations, risks, security and privacy concerns of performing an evaluation and management service by telephone and the availability of in person appointments. I also discussed with the patient that there may be a patient responsible charge related to this service. The patient expressed understanding and agreed to proceed.    I connected with Ashley [name] on 08/12/19  at   1:30 PM EST  EDT by telephone and verified that I am speaking with the correct person using two identifiers.   Consent I discussed the limitations, risks, security and privacy concerns of performing an evaluation and management service by telephone and the availability of in person appointments. I also discussed with the patient that there may be a patient responsible charge related to this service. The patient expressed understanding and agreed to proceed.   Location of Patient: Private Residence   Location of Provider: [facility] [employer] [facility]    Persons participating in Telemedicine visit: Bertram Denver FNP-BC [place] CMA [place]INE K[place]ALL    History of Present Illness: Telemedicine visit for: Facial Pain  States she feels like her right upper wisdom tooth came through over a week or 2 ago.  Since then she has been experiencing facial pain and swelling along with bilateral ear pain and headache.  Gums are also swollen and she finds it difficult to eat.  She denies any purulent drainage or foul odor in her mouth.   Past Medical History:  Diagnosis Date  . Anxiety   . Chronic left-sided back pain   . Depression   . GERD (gastroesophageal reflux disease)     Past Surgical History:  Procedure Laterality Date  . ESOPHAGOGASTRODUODENOSCOPY (EGD) WITH PROPOFOL N/A 03/15/2019   Procedure:  ESOPHAGOGASTRODUODENOSCOPY (EGD) WITH PROPOFOL;  Surgeon: Benancio Deeds, MD;  Location: [facility];  Service: Gastroenterology;  Laterality: N/A;  . IR ANGIOGRAM SELECTIVE EACH ADDITIONAL VESSEL  03/15/2019  . IR ANGIOGRAM SELECTIVE EACH ADDITIONAL VESSEL  03/15/2019  . IR ANGIOGRAM VISCERAL SELECTIVE  03/15/2019  . IR EMBO ART  VEN HEMORR LYMPH EXTRAV  INC GUIDE ROADMAPPING  03/15/2019  . IR US GUIDE VASC ACCESS RIGHT  03/15/2019  . TUBAL LIGATION      Family History  Problem Relation Age of Onset  . Stomach cancer Mother   . Colon cancer Father   . Stomach cancer Sister   . Pancreatic cancer Neg Hx   . Esophageal cancer Neg Hx     Social History   Socioeconomic History  . Marital status: Divorced    Spouse name: Not on file  . Number of children: Not on file  . Years of education: Not on file  . Highest education level: Not on file  Occupational History  . Not on file  Tobacco Use  . Smoking status: Current Every Day Smoker    Types: Cigarettes  . Smokeless tobacco: Never Used  Substance and Sexual Activity  . Alcohol use: No  . Drug use: No  . Sexual activity: Yes  Other Topics Concern  . Not on file  Social History Narrative  . Not on file   Social Determinants of Health   Financial Resource Strain:   . Difficulty of Paying Living Expenses: Not on file  Food Insecurity:   . Worried About Programme researcher, broadcasting/film/video  in the Last Year: Not on file  . Ran Out of Food in the Last Year: Not on file  Transportation Needs:   . Lack of Transportation (Medical): Not on file  . Lack of Transportation (Non-Medical): Not on file  Physical Activity:   . Days of Exercise per Week: Not on file  . Minutes of Exercise per Session: Not on file  Stress:   . Feeling of Stress : Not on file  Social Connections:   . Frequency of Communication with Friends and Family: Not on file  . Frequency of Social Gatherings with Friends and Family: Not on file  . Attends Religious Services: Not  on file  . Active Member of Clubs or Organizations: Not on file  . Attends Archivist Meetings: Not on file  . Marital Status: Not on file     Observations/Objective: Awake, alert and oriented x 3   Review of Systems  Constitutional: Negative for fever, malaise/fatigue and weight loss.       SEE HPI  HENT: Positive for ear pain. Negative for nosebleeds.   Eyes: Negative for blurred vision.       Perio orbital pain  Cardiovascular: Negative.  Negative for chest pain, palpitations and leg swelling.  Gastrointestinal: Negative.  Negative for nausea and vomiting.  Musculoskeletal: Negative.  Negative for myalgias.  Neurological: Positive for headaches. Negative for dizziness, focal weakness and seizures.  Psychiatric/Behavioral: Negative for suicidal ideas. The patient has insomnia.     Assessment and Plan: Ashley [name] was seen today for dental pain.  Diagnoses and all orders for this visit:  Dental abscess -     penicillin v potassium (VEETID) 500 MG tablet; Take 1 tablet (500 mg total) by mouth 4 (four) times daily for 7 days. PLEASE MAIL -     chlorhexidine (PERIDEX) 0.12 % solution; Use as directed 15 mLs in the mouth or throat 2 (two) times daily. PLEASE MAIL She lives in [place] and I have instructed her today that she needs to call the [facility] dental clinic and see if she can be seen urgently if not she needs to go to the emergency room to be evaluated for abscess requiring possible drainage.  She has also been encouraged to stop smoking as this is putting her at high risk for oral cancer.  Insomnia, unspecified type -     traZODone (DESYREL) 50 MG tablet; Take 1 tablet (50 mg total) by mouth at bedtime as needed for sleep. PLEASE MAIL     Follow Up Instructions Return in about 3 months (around 11/09/2019), or if symptoms worsen or fail to improve.     I discussed the assessment and treatment plan with the patient. The patient was provided an  opportunity to ask questions and all were answered. The patient agreed with the plan and demonstrated an understanding of the instructions.   The patient was advised to call back or seek an in-person evaluation if the symptoms worsen or if the condition fails to improve as anticipated.  I provided 17 minutes of non-face-to-face time during this encounter including median intraservice time, reviewing previous notes, labs, imaging, medications and explaining diagnosis and management.  Gildardo Pounds, FNP-BC

## 2019-08-12 NOTE — Telephone Encounter (Signed)
Call placed to [facility], Referral Coordinator, with [facility]. Pt was contacted and successfully scheduled appointment with the [place] office 08/21/19 with Therapist and Psychiatrist on 09/01/2019

## 2019-08-12 NOTE — BH Specialist Note (Addendum)
Integrated Behavioral Health Visit via Telemedicine (Telephone)  08/12/2019 Ashley [name] [id number]   Session Start time: 10:00am  Session End time: 10:40am Total time: 40   Referring Provider: NP Meredeth Ide Type of Visit: Telephonic Patient location: Home [facility] Provider location: [facility] office All persons participating in visit: MSW intern and patient  Confirmed patient's address: Yes  Confirmed patient's phone number: Yes  Any changes to demographics: No   Confirmed patient's insurance: Yes  Any changes to patient's insurance: No   Discussed confidentiality: Yes    The following statements were read to the patient and/or legal guardian that are established with the [facility] Provider.  "The purpose of this phone visit is to provide behavioral health care while limiting exposure to the coronavirus (COVID19).  There is a possibility of technology failure and discussed alternative modes of communication if that failure occurs."  "By engaging in this telephone visit, you consent to the provision of healthcare.  Additionally, you authorize for your insurance to be billed for the services provided during this telephone visit."   Patient and/or legal guardian consented to telephone visit: Yes   PRESENTING CONCERNS: Patient and/or family reports the following symptoms/concerns: acute tooth pain for the past 3 days, feeling overwhelmed by medical conditions and psychosocial stressors. Patient reports she used to be cheerful and hasn't felt like herself lately due to low mood. Duration of problem: ongoing; Severity of problem: moderate  STRENGTHS (Protective Factors/Coping Skills): Patient is interested in psychotherapy Patient draws strength from her faith Patient has a supportive relationship with her partner Patient receives [employer] Financial Assistance   GOALS ADDRESSED: Patient will: 1.  Reduce symptoms of: anxiety and depression  2.  Increase knowledge and/or ability of:  coping skills and self-management skills  3.  Demonstrate ability to: Increase healthy adjustment to current life circumstances and Increase adequate support systems for patient/family  INTERVENTIONS: Interventions utilized:  Solution-Focused Strategies, Brief CBT, Supportive Counseling and Link to [facility] Standardized Assessments completed: Not Needed  ASSESSMENT: Patient currently experiencing symptoms of depression and anxiety triggered by tooth pain, chronic hip pain, financial difficulty, and limited mobility. Patient reports she began taking fluoxetine two months ago. Patient reports medication management has improved her symptoms overall; however, she continues to experience periods of intense sadness and feeling overwhelmed, triggered by stressors. Patient reports vague suicidal ideation, however denies suicidal plan or intent. Protective factors were identified.   Patient may benefit from psychotherapy to address symptoms of depression and anxiety and utilization of dental resources to address patient reported acute tooth pain. A referral was sent to [facility]) Outpatient. Patient is scheduled for a psychotherapy appointment 08/21/19 and a psychiatry appointment 09/01/19 with [facility].  PLAN: 1. Follow up with behavioral health clinician on : Patient was encouraged to contact this MSW to schedule a follow up appointment next week.  2. Behavioral recommendations: Practice coping skills discussed in session, utilize dental resources, and follow up with [facility] referral. 3. Referral(s): [facility] (LME/Outside Clinic) and Community Resources:  Dental  Norberto Sorenson  MSW intern 08/12/19 5:05pm

## 2019-08-18 ENCOUNTER — Telehealth: Payer: Self-pay | Admitting: Nurse Practitioner

## 2019-08-18 MED FILL — busPIRone HCL 15 MG TABS: 15 | 30 days supply | Qty: 60 | Fill #1

## 2019-08-18 MED FILL — ?FLUOXETINE HCL 20 MG CAPS: 20 | 30 days supply | Qty: 30 | Fill #2

## 2019-08-18 MED FILL — OMEPRAZOLE DR 40 MG CAPSULE: 40 | 30 days supply | Qty: 60 | Fill #2

## 2019-08-18 NOTE — Telephone Encounter (Signed)
Patient calling in regards to Neurology referral.  Patient said that she has spoke with Zelda and she mentioned she would send one over.  I don't see a neurology referral in her chart.

## 2019-08-18 NOTE — Telephone Encounter (Signed)
Needs updated MRI scheduled. Would she like to proceed with this?

## 2019-08-18 NOTE — Telephone Encounter (Signed)
Spoke to patient and patient would like a Neurologist referral for her sciatic nerve.

## 2019-08-20 ENCOUNTER — Telehealth: Payer: Self-pay

## 2019-08-20 NOTE — Telephone Encounter (Signed)
Spoke to patient and she stated she would like to do the MRI.

## 2019-08-20 NOTE — Telephone Encounter (Signed)
Telephoned patient at home number. Left a voice message to call [facility] and schedule appointment.

## 2019-08-21 ENCOUNTER — Ambulatory Visit (HOSPITAL_COMMUNITY): Payer: No Typology Code available for payment source | Admitting: Clinical

## 2019-08-21 ENCOUNTER — Telehealth (HOSPITAL_COMMUNITY): Payer: Self-pay | Admitting: Clinical

## 2019-08-21 ENCOUNTER — Other Ambulatory Visit: Payer: Self-pay

## 2019-08-21 ENCOUNTER — Other Ambulatory Visit: Payer: Self-pay | Admitting: Nurse Practitioner

## 2019-08-21 ENCOUNTER — Encounter (HOSPITAL_COMMUNITY): Payer: Self-pay

## 2019-08-21 DIAGNOSIS — G8929 Other chronic pain: Secondary | ICD-10-CM

## 2019-08-21 NOTE — Telephone Encounter (Signed)
MRI ORDERED. Thanks

## 2019-08-21 NOTE — Telephone Encounter (Signed)
Patient states she had an appointment with Disability and would not be able to answer the call. Would like if I left appointment on voicemail.   Left message on voicemail- appt. March 26 @ 10:30. Left scheduling phone number on voicemail as well for patient to call and reschedule if needed :[phone]

## 2019-08-21 NOTE — Telephone Encounter (Signed)
The OPT therapist attempted text to session x2 at 9:00AM and 9:10AM ,however, the patient never responded, missing their scheduled session.

## 2019-08-24 ENCOUNTER — Ambulatory Visit: Payer: Medicaid Other | Admitting: Nurse Practitioner

## 2019-08-24 ENCOUNTER — Other Ambulatory Visit: Payer: Medicaid Other

## 2019-08-28 ENCOUNTER — Other Ambulatory Visit: Payer: Self-pay | Admitting: Nurse Practitioner

## 2019-08-28 DIAGNOSIS — Z1231 Encounter for screening mammogram for malignant neoplasm of breast: Secondary | ICD-10-CM

## 2019-09-01 ENCOUNTER — Other Ambulatory Visit: Payer: Self-pay

## 2019-09-01 ENCOUNTER — Institutional Professional Consult (permissible substitution): Payer: No Typology Code available for payment source | Admitting: Psychiatry

## 2019-09-08 ENCOUNTER — Other Ambulatory Visit: Payer: Self-pay | Admitting: Nurse Practitioner

## 2019-09-08 DIAGNOSIS — G8929 Other chronic pain: Secondary | ICD-10-CM

## 2019-09-08 MED FILL — ?TRAZODONE HCL 50 TABS: 50 | 30 days supply | Qty: 30 | Fill #1

## 2019-09-08 MED FILL — ?HYDROCHLOROTHIAZIDE 25MG T: 25 | 30 days supply | Qty: 30 | Fill #1

## 2019-09-15 MED FILL — busPIRone HCL 15 MG TABS: 15 | 30 days supply | Qty: 60 | Fill #2

## 2019-09-15 MED FILL — OMEPRAZOLE DR 40 MG CAPSULE: 40 | 30 days supply | Qty: 60 | Fill #3

## 2019-09-16 MED FILL — ?FLUOXETINE HCL 20 MG CAPS: 20 | 30 days supply | Qty: 30 | Fill #3

## 2019-09-17 MED FILL — PREGABALIN 50 MG CAPS: 50 | 30 days supply | Qty: 60 | Fill #0

## 2019-09-25 ENCOUNTER — Ambulatory Visit (HOSPITAL_COMMUNITY): Admission: RE | Admit: 2019-09-25 | Payer: Self-pay | Source: Ambulatory Visit

## 2019-09-30 ENCOUNTER — Encounter: Payer: Self-pay | Admitting: Nurse Practitioner

## 2019-09-30 ENCOUNTER — Ambulatory Visit: Payer: Self-pay | Attending: Nurse Practitioner | Admitting: Nurse Practitioner

## 2019-09-30 ENCOUNTER — Other Ambulatory Visit: Payer: Self-pay

## 2019-09-30 DIAGNOSIS — F17210 Nicotine dependence, cigarettes, uncomplicated: Secondary | ICD-10-CM | POA: Insufficient documentation

## 2019-09-30 DIAGNOSIS — Z800 Family history of malignant neoplasm of digestive organs: Secondary | ICD-10-CM | POA: Insufficient documentation

## 2019-09-30 DIAGNOSIS — F419 Anxiety disorder, unspecified: Secondary | ICD-10-CM | POA: Insufficient documentation

## 2019-09-30 DIAGNOSIS — K219 Gastro-esophageal reflux disease without esophagitis: Secondary | ICD-10-CM | POA: Insufficient documentation

## 2019-09-30 DIAGNOSIS — F329 Major depressive disorder, single episode, unspecified: Secondary | ICD-10-CM | POA: Insufficient documentation

## 2019-09-30 DIAGNOSIS — G4700 Insomnia, unspecified: Secondary | ICD-10-CM | POA: Insufficient documentation

## 2019-09-30 DIAGNOSIS — F17200 Nicotine dependence, unspecified, uncomplicated: Secondary | ICD-10-CM

## 2019-09-30 NOTE — Progress Notes (Signed)
Virtual Visit via Telephone Note Due to national recommendations of social distancing due to COVID 19, telehealth visit is felt to be most appropriate for this patient at this time.  I discussed the limitations, risks, security and privacy concerns of performing an evaluation and management service by telephone and the availability of in person appointments. I also discussed with the patient that there may be a patient responsible charge related to this service. The patient expressed understanding and agreed to proceed.    I connected with Tally Due on 09/30/19  at   8:30 AM EDT  EDT by telephone and verified that I am speaking with the correct person using two identifiers.   Consent I discussed the limitations, risks, security and privacy concerns of performing an evaluation and management service by telephone and the availability of in person appointments. I also discussed with the patient that there may be a patient responsible charge related to this service. The patient expressed understanding and agreed to proceed.   Location of Patient: Private Residence    Location of Provider: [facility]    Persons participating in Telemedicine visit: Bertram Denver FNP-BC YY [place] CMA Tally Due    History of Present Illness: Telemedicine visit for: Insomnia  Trazodone 50 mg  is not helping her to sleep. She is still waking up every few hours. Also endorses headaches upon awakening. I have instructed her to increase trazodone to 1-1.5 tablets. Will have her come into the office in a few weeks for further evaluation of effectiveness. Denies chest pain, shortness of breath, palpitations, lightheadedness, dizziness, or visual disturbances.   Past Medical History:  Diagnosis Date  . Anxiety   . Chronic left-sided back pain   . Depression   . GERD (gastroesophageal reflux disease)     Past Surgical History:  Procedure Laterality Date  .  ESOPHAGOGASTRODUODENOSCOPY (EGD) WITH PROPOFOL N/A 03/15/2019   Procedure: ESOPHAGOGASTRODUODENOSCOPY (EGD) WITH PROPOFOL;  Surgeon: Benancio Deeds, MD;  Location: [facility];  Service: Gastroenterology;  Laterality: N/A;  . IR ANGIOGRAM SELECTIVE EACH ADDITIONAL VESSEL  03/15/2019  . IR ANGIOGRAM SELECTIVE EACH ADDITIONAL VESSEL  03/15/2019  . IR ANGIOGRAM VISCERAL SELECTIVE  03/15/2019  . IR EMBO ART  VEN HEMORR LYMPH EXTRAV  INC GUIDE ROADMAPPING  03/15/2019  . IR US GUIDE VASC ACCESS RIGHT  03/15/2019  . TUBAL LIGATION      Family History  Problem Relation Age of Onset  . Stomach cancer Mother   . Colon cancer Father   . Stomach cancer Sister   . Pancreatic cancer Neg Hx   . Esophageal cancer Neg Hx     Social History   Socioeconomic History  . Marital status: Divorced    Spouse name: Not on file  . Number of children: Not on file  . Years of education: Not on file  . Highest education level: Not on file  Occupational History  . Not on file  Tobacco Use  . Smoking status: Current Every Day Smoker    Types: Cigarettes  . Smokeless tobacco: Never Used  Substance and Sexual Activity  . Alcohol use: No  . Drug use: No  . Sexual activity: Yes  Other Topics Concern  . Not on file  Social History Narrative  . Not on file   Social Determinants of Health   Financial Resource Strain:   . Difficulty of Paying Living Expenses:   Food Insecurity:   . Worried About Programme researcher, broadcasting/film/video  in the Last Year:   . [place] in the Last Year:   Transportation Needs:   . Film/video editor (Medical):   Marland Kitchen Lack of Transportation (Non-Medical):   Physical Activity:   . Days of Exercise per Week:   . Minutes of Exercise per Session:   Stress:   . Feeling of Stress :   Social Connections:   . Frequency of Communication with Friends and Family:   . Frequency of Social Gatherings with Friends and Family:   . Attends Religious Services:   . Active Member of Clubs or  Organizations:   . Attends Archivist Meetings:   Marland Kitchen Marital Status:      Observations/Objective: Awake, alert and oriented x 3   ROS  Assessment and Plan: Caelin was seen today for medication adherence.  Diagnoses and all orders for this visit:  Insomnia, unspecified type Increase trazodone as instructed.  Sleep hygiene recommendations given  Tobacco dependence Marija was counseled on the dangers of tobacco use, and was advised to quit. Reviewed strategies to maximize success, including removing cigarettes and smoking materials from environment, stress management and support of family/friends as well as pharmacological alternatives including: Wellbutrin, Chantix, Nicotine patch, Nicotine gum or lozenges. Smoking cessation support: smoking cessation hotline: 1-800-QUIT-NOW.  Smoking cessation classes are also available through [facility]. Call [phone] or visit our website at [email or web address]   A total of 2 minutes was spent on counseling for smoking cessation and Marcey is not ready to quit.      Follow Up Instructions Return in about 3 weeks (around 10/21/2019) for insomnia.     I discussed the assessment and treatment plan with the patient. The patient was provided an opportunity to ask questions and all were answered. The patient agreed with the plan and demonstrated an understanding of the instructions.   The patient was advised to call back or seek an in-person evaluation if the symptoms worsen or if the condition fails to improve as anticipated.  I provided 14 minutes of non-face-to-face time during this encounter including median intraservice time, reviewing previous notes, labs, imaging, medications and explaining diagnosis and management.  Gildardo Pounds, FNP-BC

## 2019-10-06 ENCOUNTER — Telehealth: Payer: Self-pay | Admitting: Nurse Practitioner

## 2019-10-06 MED FILL — ?TRAZODONE HCL 50 TABS: 50 | 30 days supply | Qty: 30 | Fill #2

## 2019-10-06 NOTE — Telephone Encounter (Signed)
Patient wants a one time medication for MRI sent to [facility]. Pt states she couldn't lay still the last time.

## 2019-10-09 NOTE — Telephone Encounter (Signed)
Attempt to reach patient to inform PCP stated she can take 2 extra Buspar to help her claustrophobia for her MRI.

## 2019-10-09 NOTE — Telephone Encounter (Signed)
Patient called and requested for a medication that she could take to help with her Claustrophobia, for her MRI appointment tomorrow. Patient requested for the medication to be sent to [facility] [place]. Please follow up at your earliest convenience.

## 2019-10-09 NOTE — Telephone Encounter (Signed)
No answer and LVM.  

## 2019-10-10 ENCOUNTER — Ambulatory Visit (HOSPITAL_COMMUNITY)
Admission: RE | Admit: 2019-10-10 | Discharge: 2019-10-10 | Disposition: A | Discharge status: Home / Self Care | Payer: Self-pay | Source: Ambulatory Visit | Attending: Nurse Practitioner | Admitting: Nurse Practitioner

## 2019-10-10 ENCOUNTER — Other Ambulatory Visit: Payer: Self-pay

## 2019-10-10 DIAGNOSIS — M5442 Lumbago with sciatica, left side: Secondary | ICD-10-CM | POA: Insufficient documentation

## 2019-10-10 DIAGNOSIS — G8929 Other chronic pain: Secondary | ICD-10-CM | POA: Insufficient documentation

## 2019-10-13 MED FILL — ?HYDROCHLOROTHIAZIDE 25MG T: 25 | 30 days supply | Qty: 30 | Fill #2

## 2019-10-15 ENCOUNTER — Ambulatory Visit: Payer: Medicaid Other | Admitting: Nurse Practitioner

## 2019-10-16 ENCOUNTER — Ambulatory Visit: Payer: Medicaid Other | Admitting: Nurse Practitioner

## 2019-10-20 ENCOUNTER — Other Ambulatory Visit: Payer: Self-pay | Admitting: Nurse Practitioner

## 2019-10-20 DIAGNOSIS — R937 Abnormal findings on diagnostic imaging of other parts of musculoskeletal system: Secondary | ICD-10-CM

## 2019-10-20 DIAGNOSIS — G8929 Other chronic pain: Secondary | ICD-10-CM

## 2019-10-20 MED ORDER — PREGABALIN 50 MG PO CAPS: 50.0000 mg | ORAL_CAPSULE | Freq: Two times a day (BID) | ORAL | 0 refills | Status: DC

## 2019-10-20 MED FILL — busPIRone HCL 15 MG TABS: 15 | 30 days supply | Qty: 60 | Fill #1

## 2019-10-20 NOTE — Telephone Encounter (Signed)
MRI shows bulging discs. I was going to discuss this during her office visit however she was a no show. Will refer to spine specialist for further evaluation and medication sent to pharmacy for her back pain

## 2019-10-20 NOTE — Telephone Encounter (Signed)
Patient called saying she is having back pain and would like for her PCP to prescribe her medication. If medication is prescribed patient would like it sent to [facility] in [place].

## 2019-10-22 NOTE — Telephone Encounter (Signed)
Attempt to reach patient to inform on PCP advising. No answer and LVM for call back.  

## 2019-11-10 ENCOUNTER — Encounter: Payer: Self-pay | Admitting: Nurse Practitioner

## 2019-11-10 ENCOUNTER — Other Ambulatory Visit: Payer: Self-pay

## 2019-11-10 ENCOUNTER — Other Ambulatory Visit: Payer: Self-pay | Admitting: Nurse Practitioner

## 2019-11-10 ENCOUNTER — Ambulatory Visit: Payer: Self-pay | Attending: Nurse Practitioner | Admitting: Nurse Practitioner

## 2019-11-10 VITALS — BP 144/81 | HR 67 | Temp 97.7°F | Ht 63.0 in | Wt 157.0 lb

## 2019-11-10 DIAGNOSIS — K219 Gastro-esophageal reflux disease without esophagitis: Secondary | ICD-10-CM

## 2019-11-10 DIAGNOSIS — M5442 Lumbago with sciatica, left side: Secondary | ICD-10-CM

## 2019-11-10 DIAGNOSIS — R937 Abnormal findings on diagnostic imaging of other parts of musculoskeletal system: Secondary | ICD-10-CM

## 2019-11-10 DIAGNOSIS — K047 Periapical abscess without sinus: Secondary | ICD-10-CM

## 2019-11-10 DIAGNOSIS — G4700 Insomnia, unspecified: Secondary | ICD-10-CM

## 2019-11-10 DIAGNOSIS — I10 Essential (primary) hypertension: Secondary | ICD-10-CM

## 2019-11-10 DIAGNOSIS — G8929 Other chronic pain: Secondary | ICD-10-CM

## 2019-11-10 DIAGNOSIS — F419 Anxiety disorder, unspecified: Secondary | ICD-10-CM

## 2019-11-10 DIAGNOSIS — Z1211 Encounter for screening for malignant neoplasm of colon: Secondary | ICD-10-CM

## 2019-11-10 MED ORDER — TRAZODONE HCL 100 MG PO TABS: 100.0000 mg | ORAL_TABLET | Freq: Every day | ORAL | 3 refills | Status: DC

## 2019-11-10 MED ORDER — OMEPRAZOLE 40 MG PO CPDR: 40.0000 mg | DELAYED_RELEASE_CAPSULE | Freq: Two times a day (BID) | ORAL | 3 refills | Status: DC

## 2019-11-10 MED ORDER — CHLORHEXIDINE GLUCONATE 0.12 % MT SOLN: 15.0000 mL | Freq: Two times a day (BID) | OROMUCOSAL | 0 refills | Status: DC

## 2019-11-10 MED ORDER — BUSPIRONE HCL 30 MG PO TABS: 30.0000 mg | ORAL_TABLET | Freq: Two times a day (BID) | ORAL | 6 refills | Status: AC

## 2019-11-10 MED ORDER — TRAMADOL HCL 50 MG PO TABS: 50.0000 mg | ORAL_TABLET | Freq: Two times a day (BID) | ORAL | 0 refills | Status: DC | PRN

## 2019-11-10 MED ORDER — HYDROCHLOROTHIAZIDE 25 MG PO TABS: 25.0000 mg | ORAL_TABLET | Freq: Every day | ORAL | 3 refills | Status: AC

## 2019-11-10 MED ORDER — AMLODIPINE BESYLATE 5 MG PO TABS: 5.0000 mg | ORAL_TABLET | Freq: Every day | ORAL | 3 refills | Status: AC

## 2019-11-10 NOTE — Progress Notes (Signed)
Assessment & Plan:  Ashley [name] was seen today for blood pressure check.  Diagnoses and all orders for this visit:  Chronic left-sided low back pain with left-sided sciatica -     traMADol (ULTRAM) 50 MG tablet; Take 1 tablet (50 mg total) by mouth every 12 (twelve) hours as needed. -     Drug Screen 12+Alcohol+CRT, Ur AVOID TOBACCO PRODUCTS  Essential hypertension -     hydrochlorothiazide (HYDRODIURIL) 25 MG tablet; Take 1 tablet (25 mg total) by mouth daily. -     amLODipine (NORVASC) 5 MG tablet; Take 1 tablet (5 mg total) by mouth daily. Continue all antihypertensives as prescribed.  Remember to bring in your blood pressure log with you for your follow up appointment.  DASH/Mediterranean Diets are healthier choices for HTN.    Colon cancer screening -     Fecal occult blood, imunochemical([facility])  Insomnia, unspecified type -     traZODone (DESYREL) 100 MG tablet; Take 1 tablet (100 mg total) by mouth at bedtime.  Anxiety -     busPIRone (BUSPAR) 30 MG tablet; Take 1 tablet (30 mg total) by mouth 2 (two) times daily.  Dental abscess -     chlorhexidine (PERIDEX) 0.12 % solution; Use as directed 15 mLs in the mouth or throat 2 (two) times daily. PLEASE MAIL  Gastroesophageal reflux disease, unspecified whether esophagitis present -     omeprazole (PRILOSEC) 40 MG capsule; Take 1 capsule (40 mg total) by mouth 2 (two) times daily. Take 30-60 minutes before breakfast and dinner. INSTRUCTIONS: Avoid GERD Triggers: acidic, spicy or fried foods, caffeine, coffee, sodas,  alcohol and chocolate.   Patient has been counseled on age-appropriate routine health concerns for screening and prevention. These are reviewed and up-to-date. Referrals have been placed accordingly. Immunizations are up-to-date or declined.    Subjective:   Chief Complaint  Patient presents with   Blood Pressure Check    Pt. is here for blood pressure check. Pt. stated her sciatic nerve on her left  leg from her hip to her knee is giving her pain. Pt. stated the Lyrica is not helping her.    HPI Ashley [name] 54 y.o. female presents to office today for BP check and follow up on back pain.  Using crutches today.    Essential Hypertension Not well controlled. Currently prescribed HCTZ 25 mg daily. Will add  amlodipine 5 mg today. Endorses back pain which may be contributing to her elevated BP.  Denies chest pain, shortness of breath, palpitations, lightheadedness, dizziness, headaches or BLE edema.  BP Readings from Last 3 Encounters:  11/10/19 (!) 144/81  06/10/19 (!) 162/82  03/21/19 140/85    Anxiety and Depression  Will increase buspar to 30 mg BID. She stopped taking prozac. Hasn't been able to sleep. Will try trazodone at increased dosage (50 to 100mg ) for insomnia and may also help with depression.  Depression screen [facility] 2/9 11/10/2019 04/02/2019  Decreased Interest 3 2  Down, Depressed, Hopeless 3 2  PHQ - 2 Score 6 4  Altered sleeping 3 2  Tired, decreased energy 3 2  Change in appetite 3 2  Feeling bad or failure about yourself  3 2  Trouble concentrating 3 0  Moving slowly or fidgety/restless 3 0  Suicidal thoughts 1 0  PHQ-9 Score 25 12  Difficult doing work/chores - Very difficult   GAD 7 : Generalized Anxiety Score 11/10/2019  Nervous, Anxious, on Edge 3  Control/stop worrying 3  Worry too  much - different things 3  Trouble relaxing 3  Restless 3  Easily annoyed or irritable 3  Afraid - awful might happen 1  Total GAD 7 Score 19   Back Pain Chronic. Endorses left sided sciatica.  Worse with prolonged sitting and standing. Using a walker to offset her back pain when she is mobile. She does not take any OTC medications for her back pain. Denies any involuntary loss of bowel or bladder. She endorses left sided sciatica. She can not take NSAIDs due to GI bleed.  Last MRI 11-2016:  Small right-sided disc protrusion L4-5 without stenosis or neural compression..  Using walker for mobility. She denies numbness, tingling and burning in the legs. Uses walker to offset the pain in her low back. IMPRESSION: Small right-sided disc protrusion L4-5 without stenosis or neural Compression.  She will need a referral to ortho. States lyrica was ineffective. Will try tramadol. She states she has been using oxycodone prescribed for someone she knows. She is aware she can not take both. There will be another random drug screen in the future. If positive for codeine I will not prescribe any additional controlled substance.  Patient has been advised to apply for financial assistance and schedule to see our financial counselor.   Review of Systems  Constitutional: Negative for fever, malaise/fatigue and weight loss.  HENT: Negative.  Negative for nosebleeds.   Eyes: Negative.  Negative for blurred vision, double vision and photophobia.  Respiratory: Negative.  Negative for cough and shortness of breath.   Cardiovascular: Negative.  Negative for chest pain, palpitations and leg swelling.  Gastrointestinal: Positive for heartburn. Negative for nausea and vomiting.  Musculoskeletal: Positive for back pain and myalgias.  Neurological: Negative.  Negative for dizziness, focal weakness, seizures and headaches.  Psychiatric/Behavioral: Positive for depression. Negative for suicidal ideas. The patient is nervous/anxious.     Past Medical History:  Diagnosis Date   Anxiety    Chronic left-sided back pain    Depression    GERD (gastroesophageal reflux disease)     Past Surgical History:  Procedure Laterality Date   ESOPHAGOGASTRODUODENOSCOPY (EGD) WITH PROPOFOL N/A 03/15/2019   Procedure: ESOPHAGOGASTRODUODENOSCOPY (EGD) WITH PROPOFOL;  Surgeon: Yetta Flock, MD;  Location: [facility];  Service: Gastroenterology;  Laterality: N/A;   IR ANGIOGRAM SELECTIVE EACH ADDITIONAL VESSEL  03/15/2019   IR ANGIOGRAM SELECTIVE EACH ADDITIONAL VESSEL  03/15/2019   IR  ANGIOGRAM VISCERAL SELECTIVE  03/15/2019   IR EMBO ART  VEN HEMORR LYMPH EXTRAV  INC GUIDE ROADMAPPING  03/15/2019   IR US GUIDE VASC ACCESS RIGHT  03/15/2019   TUBAL LIGATION      Family History  Problem Relation Age of Onset   Stomach cancer Mother    Colon cancer Father    Stomach cancer Sister    Pancreatic cancer Neg Hx    Esophageal cancer Neg Hx     Social History Reviewed with no changes to be made today.   Outpatient Medications Prior to Visit  Medication Sig Dispense Refill   busPIRone (BUSPAR) 15 MG tablet Take 1 tablet (15 mg total) by mouth 2 (two) times daily. 60 tablet 2   chlorhexidine (PERIDEX) 0.12 % solution Use as directed 15 mLs in the mouth or throat 2 (two) times daily. PLEASE MAIL 473 mL 0   FLUoxetine (PROZAC) 20 MG tablet Take 1 tablet (20 mg total) by mouth daily. 30 tablet 3   hydrochlorothiazide (HYDRODIURIL) 25 MG tablet Take 1 tablet (25 mg total) by  mouth daily. 90 tablet 3   omeprazole (PRILOSEC) 40 MG capsule Take 1 capsule (40 mg total) by mouth 2 (two) times daily. Take 30-60 minutes before breakfast and dinner. 60 capsule 3   AMBULATORY NON FORMULARY MEDICATION GI Cocktail 90 ml Dicyclomine 10 mg/5 ml 90 ml Lidocaine 2 % 270 ml Maalox Take 5-10 ml every 4-6  Hours as needed (Patient not taking: Reported on 08/06/2019) 450 mL 0   pregabalin (LYRICA) 50 MG capsule Take 1 capsule (50 mg total) by mouth 2 (two) times daily. (Patient not taking: Reported on 11/10/2019) 60 capsule 0   traZODone (DESYREL) 50 MG tablet Take 1 tablet (50 mg total) by mouth at bedtime as needed for sleep. PLEASE MAIL 30 tablet 3   No facility-administered medications prior to visit.    No Known Allergies     Objective:    BP (!) 144/81 (BP Location: Left Arm, Patient Position: Sitting, Cuff Size: Normal)    Pulse 67    Temp 97.7 F (36.5 C) (Temporal)    Ht 5\' 3"  (1.6 m)    Wt 157 lb (71.2 kg)    SpO2 98%    BMI 27.81 kg/m  Wt Readings from Last 3  Encounters:  11/10/19 157 lb (71.2 kg)  06/10/19 151 lb (68.5 kg)  03/15/19 140 lb (63.5 kg)    Physical Exam Vitals and nursing note reviewed.  Constitutional:      Appearance: She is well-developed.  HENT:     Head: Normocephalic and atraumatic.  Cardiovascular:     Rate and Rhythm: Normal rate and regular rhythm.     Heart sounds: Normal heart sounds. No murmur. No friction rub. No gallop.   Pulmonary:     Effort: Pulmonary effort is normal. No tachypnea or respiratory distress.     Breath sounds: Normal breath sounds. No decreased breath sounds, wheezing, rhonchi or rales.  Chest:     Chest wall: No tenderness.  Abdominal:     General: Bowel sounds are normal.     Palpations: Abdomen is soft.  Musculoskeletal:        General: Normal range of motion.     Cervical back: Normal range of motion.     Lumbar back: Tenderness present.     Comments: There is pain elicited in the lumbar spine with passive extension and flexion of left leg and right leg  Skin:    General: Skin is warm and dry.  Neurological:     Mental Status: She is alert and oriented to person, place, and time.     Coordination: Coordination normal.  Psychiatric:        Behavior: Behavior normal. Behavior is cooperative.        Thought Content: Thought content normal.        Judgment: Judgment normal.          Patient has been counseled extensively about nutrition and exercise as well as the importance of adherence with medications and regular follow-up. The patient was given clear instructions to go to ER or return to medical center if symptoms don't improve, worsen or new problems develop. The patient verbalized understanding.   Follow-up: Return for 4-6 weeks pap and BP recheck .   03/17/19, FNP-BC [facility] and [facility] [place], [place]

## 2019-11-11 ENCOUNTER — Ambulatory Visit: Payer: Self-pay | Attending: Nurse Practitioner

## 2019-11-12 LAB — FECAL OCCULT BLOOD, IMMUNOCHEMICAL: Fecal Occult Bld: NEGATIVE

## 2019-11-13 ENCOUNTER — Ambulatory Visit: Payer: Medicaid Other | Admitting: Internal Medicine

## 2019-11-14 LAB — DRUG SCREEN 12+ALCOHOL+CRT, UR
Amphetamines, Urine: NEGATIVE ng/mL
BENZODIAZ UR QL: NEGATIVE ng/mL
Barbiturate: NEGATIVE ng/mL
Cannabinoids: NEGATIVE ng/mL
Cocaine (Metabolite): NEGATIVE ng/mL
Creatinine, Urine: 100.9 mg/dL (ref 20.0–300.0)
Ethanol, Urine: NEGATIVE %
Meperidine: NEGATIVE ng/mL
Methadone: NEGATIVE ng/mL
OPIATE SCREEN URINE: POSITIVE ng/mL — AB
Oxycodone/Oxymorphone, Urine: NEGATIVE ng/mL
Phencyclidine: NEGATIVE ng/mL
Propoxyphene: NEGATIVE ng/mL
Tramadol: NEGATIVE ng/mL

## 2019-11-17 ENCOUNTER — Telehealth: Payer: Self-pay | Admitting: Nurse Practitioner

## 2019-11-17 NOTE — Telephone Encounter (Signed)
Patient called in and requested for an update for her orthopedic referral. Please follow up at your earliest convenience.

## 2019-11-18 NOTE — Telephone Encounter (Signed)
Noted  Referral sent to [facility] patient aware   Thank You

## 2019-11-21 ENCOUNTER — Other Ambulatory Visit: Payer: Self-pay | Admitting: Nurse Practitioner

## 2019-11-21 DIAGNOSIS — Z1211 Encounter for screening for malignant neoplasm of colon: Secondary | ICD-10-CM

## 2019-11-23 ENCOUNTER — Encounter: Payer: Self-pay | Admitting: Gastroenterology

## 2019-11-24 ENCOUNTER — Telehealth: Payer: Self-pay | Admitting: Nurse Practitioner

## 2019-11-24 NOTE — Telephone Encounter (Signed)
Will route to PCP 

## 2019-11-24 NOTE — Telephone Encounter (Signed)
Patient requested for tylenol 3 instead of tramadol. Patient stated tramadol is not working. [facility] pharmacy.

## 2019-11-27 NOTE — Telephone Encounter (Signed)
Can not switch until 6-11

## 2019-11-30 ENCOUNTER — Encounter: Payer: Self-pay | Admitting: Nurse Practitioner

## 2019-12-02 ENCOUNTER — Other Ambulatory Visit: Payer: Self-pay

## 2019-12-02 ENCOUNTER — Ambulatory Visit (INDEPENDENT_AMBULATORY_CARE_PROVIDER_SITE_OTHER): Payer: Self-pay

## 2019-12-02 ENCOUNTER — Ambulatory Visit (INDEPENDENT_AMBULATORY_CARE_PROVIDER_SITE_OTHER): Payer: Self-pay | Admitting: Orthopaedic Surgery

## 2019-12-02 ENCOUNTER — Encounter: Payer: Self-pay | Admitting: Orthopaedic Surgery

## 2019-12-02 VITALS — BP 123/79 | HR 67

## 2019-12-02 DIAGNOSIS — M25562 Pain in left knee: Secondary | ICD-10-CM

## 2019-12-02 DIAGNOSIS — M25552 Pain in left hip: Secondary | ICD-10-CM

## 2019-12-02 DIAGNOSIS — G8929 Other chronic pain: Secondary | ICD-10-CM

## 2019-12-02 DIAGNOSIS — S72002K Fracture of unspecified part of neck of left femur, subsequent encounter for closed fracture with nonunion: Secondary | ICD-10-CM | POA: Insufficient documentation
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
## 2019-12-03 ENCOUNTER — Ambulatory Visit (INDEPENDENT_AMBULATORY_CARE_PROVIDER_SITE_OTHER): Payer: Medicaid Other | Admitting: Surgery

## 2019-12-03 ENCOUNTER — Telehealth: Payer: Self-pay | Admitting: Nurse Practitioner

## 2019-12-03 ENCOUNTER — Encounter: Payer: Self-pay | Admitting: Surgery

## 2019-12-03 VITALS — BP 124/79 | HR 74 | Ht 63.0 in | Wt 157.0 lb

## 2019-12-03 DIAGNOSIS — S72002K Fracture of unspecified part of neck of left femur, subsequent encounter for closed fracture with nonunion: Secondary | ICD-10-CM

## 2019-12-03 NOTE — Telephone Encounter (Signed)
Eunice Blase called saying patient was seen by Dr. Ophelia Charter and was proposed to have hip surgery on 12/14/19. Debbie faxed over a surgery clearance form on 12/02/19 and would like to know what direction to take regarding the clearance. Patient was recently at the hospital and was treated for a GI bleed. Please f/u with Debbie.

## 2019-12-03 NOTE — Progress Notes (Signed)
54 year old white female with history of left hip DJD comes in for preop evaluation.  States that hip symptoms unchanged in previous visit.  She is wanting to proceed with left total hip replacement as scheduled.  Today history and physical performed.  Patient states that last year she had a week long hospital admission for GI bleed.  After her discharge states that she did not follow-up with a gastroenterologist and her primary care physician has been managing this with Prilosec.  She has not had an endoscopy since her hospital admission.  She denies abdominal pain, hematochezia, melena or hematemesis.  Our surgery scheduler did ask for a medical clearance and I asked if she could contact patient's primary care physician and also have them clear patient from a GI standpoint as well since they have been managing her GI issue.  Discern is that we will need to have patient start something for postop DVT prophylaxis and this could potentially be a problem.  Advised patient that it may ultimately come down her needing to see a GI specialist preop but will let her PCP make that decision.  All questions answered.

## 2019-12-03 NOTE — Telephone Encounter (Signed)
Attempt to reach patient to inform. No answer and LVM.  

## 2019-12-04 ENCOUNTER — Telehealth: Payer: Self-pay | Admitting: Orthopaedic Surgery

## 2019-12-04 NOTE — Telephone Encounter (Signed)
CMA spoke to PCP and informed on the information for patient's medical clearance.

## 2019-12-04 NOTE — Telephone Encounter (Signed)
See below

## 2019-12-04 NOTE — Telephone Encounter (Signed)
Pt called requesting provider call her once sx clearance form complete and faxed.

## 2019-12-04 NOTE — Telephone Encounter (Signed)
Pt called wanting to confirm that we got her clearance letter for surgery?   [phone]

## 2019-12-08 ENCOUNTER — Encounter: Payer: Self-pay | Admitting: Nurse Practitioner

## 2019-12-08 ENCOUNTER — Other Ambulatory Visit: Payer: Self-pay

## 2019-12-08 ENCOUNTER — Ambulatory Visit: Payer: Self-pay | Attending: Nurse Practitioner | Admitting: Nurse Practitioner

## 2019-12-08 ENCOUNTER — Other Ambulatory Visit (HOSPITAL_COMMUNITY)
Admission: RE | Admit: 2019-12-08 | Discharge: 2019-12-08 | Disposition: A | Discharge status: Home / Self Care | Payer: Medicaid Other | Source: Ambulatory Visit | Attending: Nurse Practitioner | Admitting: Nurse Practitioner

## 2019-12-08 VITALS — BP 111/70 | HR 76 | Temp 97.7°F | Ht 63.0 in | Wt 160.0 lb

## 2019-12-08 DIAGNOSIS — F329 Major depressive disorder, single episode, unspecified: Secondary | ICD-10-CM | POA: Insufficient documentation

## 2019-12-08 DIAGNOSIS — I10 Essential (primary) hypertension: Secondary | ICD-10-CM | POA: Insufficient documentation

## 2019-12-08 DIAGNOSIS — Z01419 Encounter for gynecological examination (general) (routine) without abnormal findings: Secondary | ICD-10-CM | POA: Insufficient documentation

## 2019-12-08 DIAGNOSIS — K219 Gastro-esophageal reflux disease without esophagitis: Secondary | ICD-10-CM | POA: Insufficient documentation

## 2019-12-08 DIAGNOSIS — Z124 Encounter for screening for malignant neoplasm of cervix: Secondary | ICD-10-CM | POA: Insufficient documentation

## 2019-12-08 DIAGNOSIS — F419 Anxiety disorder, unspecified: Secondary | ICD-10-CM | POA: Insufficient documentation

## 2019-12-08 DIAGNOSIS — Z79899 Other long term (current) drug therapy: Secondary | ICD-10-CM | POA: Insufficient documentation

## 2019-12-08 DIAGNOSIS — Z01810 Encounter for preprocedural cardiovascular examination: Secondary | ICD-10-CM | POA: Insufficient documentation

## 2019-12-08 DIAGNOSIS — Z01818 Encounter for other preprocedural examination: Secondary | ICD-10-CM

## 2019-12-08 NOTE — Progress Notes (Signed)
Assessment & Plan:  Ashley [name] was seen today for gynecologic exam.  Diagnoses and all orders for this visit:  Encounter for Papanicolaou smear for cervical cancer screening -     Cytology - PAP -     Cervicovaginal ancillary only  Essential hypertension  Pre-operative clearance -     CBC -     CMP14+EGFR -     Lipid panel    Patient has been counseled on age-appropriate routine health concerns for screening and prevention. These are reviewed and up-to-date. Referrals have been placed accordingly. Immunizations are up-to-date or declined.    Subjective:   Chief Complaint  Patient presents with   Gynecologic Exam    Pt. is here for a pap smear and BP check.    HPI Ashley [name] 54 y.o. female presents to office today for PAP, BP recheck and medical clearance for left hip surgery.   Essential Hypertension Well controlled on amlodipine 5 mg and HCTZ 25 mg daily.               BP Readings from Last 3 Encounters:  12/08/19 111/70  12/03/19 124/79  12/02/19 123/79    Review of Systems  Constitutional: Negative.  Negative for chills, fever, malaise/fatigue and weight loss.  Respiratory: Negative.  Negative for cough, shortness of breath and wheezing.   Cardiovascular: Negative.  Negative for chest pain, orthopnea and leg swelling.  Gastrointestinal: Negative for abdominal pain.  Genitourinary: Negative.  Negative for flank pain.  Skin: Negative.  Negative for rash.  Psychiatric/Behavioral: Negative for suicidal ideas.    Past Medical History:  Diagnosis Date   Anxiety    Chronic left-sided back pain    Depression    GERD (gastroesophageal reflux disease)     Past Surgical History:  Procedure Laterality Date   ESOPHAGOGASTRODUODENOSCOPY (EGD) WITH PROPOFOL N/A 03/15/2019   Procedure: ESOPHAGOGASTRODUODENOSCOPY (EGD) WITH PROPOFOL;  Surgeon: Yetta Flock, MD;  Location: [facility];  Service: Gastroenterology;  Laterality: N/A;   IR ANGIOGRAM  SELECTIVE EACH ADDITIONAL VESSEL  03/15/2019   IR ANGIOGRAM SELECTIVE EACH ADDITIONAL VESSEL  03/15/2019   IR ANGIOGRAM VISCERAL SELECTIVE  03/15/2019   IR EMBO ART  VEN HEMORR LYMPH EXTRAV  INC GUIDE ROADMAPPING  03/15/2019   IR US GUIDE VASC ACCESS RIGHT  03/15/2019   TUBAL LIGATION      Family History  Problem Relation Age of Onset   Stomach cancer Mother    Colon cancer Father    Stomach cancer Sister    Pancreatic cancer Neg Hx    Esophageal cancer Neg Hx     Social History Reviewed with no changes to be made today.   Outpatient Medications Prior to Visit  Medication Sig Dispense Refill   amLODipine (NORVASC) 5 MG tablet Take 1 tablet (5 mg total) by mouth daily. 90 tablet 3   Ascorbic Acid (VITAMIN C) 1000 MG tablet Take 1,000 mg by mouth daily.     busPIRone (BUSPAR) 30 MG tablet Take 1 tablet (30 mg total) by mouth 2 (two) times daily. 60 tablet 6   chlorhexidine (PERIDEX) 0.12 % solution Use as directed 15 mLs in the mouth or throat 2 (two) times daily. PLEASE MAIL (Patient taking differently: Use as directed 15 mLs in the mouth or throat daily. PLEASE MAIL) 473 mL 0   hydrochlorothiazide (HYDRODIURIL) 25 MG tablet Take 1 tablet (25 mg total) by mouth daily. 90 tablet 3   magnesium oxide (MAG-OX) 400 MG tablet Take 400 mg  by mouth daily.     omeprazole (PRILOSEC) 40 MG capsule Take 1 capsule (40 mg total) by mouth 2 (two) times daily. Take 30-60 minutes before breakfast and dinner. 60 capsule 3   Potassium 99 MG TABS Take 99 mg by mouth daily.     traZODone (DESYREL) 100 MG tablet Take 1 tablet (100 mg total) by mouth at bedtime. 90 tablet 3   traMADol (ULTRAM) 50 MG tablet Take 1 tablet (50 mg total) by mouth every 12 (twelve) hours as needed. (Patient not taking: Reported on 12/08/2019) 60 tablet 0   No facility-administered medications prior to visit.    No Known Allergies     Objective:    BP 111/70 (BP Location: Left Arm, Patient Position: Sitting,  Cuff Size: Normal)    Pulse 76    Temp 97.7 F (36.5 C) (Temporal)    Ht '5\' 3"'  (1.6 m)    Wt 160 lb (72.6 kg)    SpO2 97%    BMI 28.34 kg/m  Wt Readings from Last 3 Encounters:  12/08/19 160 lb (72.6 kg)  12/03/19 157 lb (71.2 kg)  11/10/19 157 lb (71.2 kg)    Physical Exam Exam conducted with a chaperone present.  Constitutional:      Appearance: She is well-developed.  HENT:     Head: Normocephalic.  Cardiovascular:     Rate and Rhythm: Normal rate and regular rhythm.     Heart sounds: Normal heart sounds.  Pulmonary:     Effort: Pulmonary effort is normal.     Breath sounds: Normal breath sounds.  Abdominal:     General: Bowel sounds are normal.     Palpations: Abdomen is soft.     Hernia: There is no hernia in the left inguinal area.  Genitourinary:    Labia:        Right: No rash, tenderness, lesion or injury.        Left: No rash, tenderness, lesion or injury.      Vagina: Normal. No signs of injury and foreign body. No vaginal discharge, erythema, tenderness or bleeding.     Cervix: No cervical motion tenderness or friability.     Uterus: Not deviated and not enlarged.      Adnexa:        Right: No mass, tenderness or fullness.         Left: No mass, tenderness or fullness.       Rectum: Normal. No external hemorrhoid.     Comments: Modified lithotomy position due to left hip pain Lymphadenopathy:     Lower Body: No right inguinal adenopathy. No left inguinal adenopathy.  Skin:    General: Skin is warm and dry.  Neurological:     Mental Status: She is alert and oriented to person, place, and time.  Psychiatric:        Behavior: Behavior normal.        Thought Content: Thought content normal.        Judgment: Judgment normal.          Patient has been counseled extensively about nutrition and exercise as well as the importance of adherence with medications and regular follow-up. The patient was given clear instructions to go to ER or return to medical center  if symptoms don't improve, worsen or new problems develop. The patient verbalized understanding.   Follow-up: No follow-ups on file.   Gildardo Pounds, FNP-BC [facility] and [facility] [place], [place]  12/08/2019, 3:00 PM

## 2019-12-09 ENCOUNTER — Other Ambulatory Visit: Payer: Self-pay | Admitting: Nurse Practitioner

## 2019-12-09 LAB — CMP14+EGFR
ALT: 21 IU/L (ref 0–32)
AST: 16 IU/L (ref 0–40)
Albumin/Globulin Ratio: 2.2 (ref 1.2–2.2)
Albumin: 4.6 g/dL (ref 3.8–4.9)
Alkaline Phosphatase: 166 IU/L — ABNORMAL HIGH (ref 48–121)
BUN/Creatinine Ratio: 20 (ref 9–23)
BUN: 13 mg/dL (ref 6–24)
Bilirubin Total: <0.2 mg/dL (ref 0.0–1.2)
CO2: 24 mmol/L (ref 20–29)
Calcium: 9.7 mg/dL (ref 8.7–10.2)
Chloride: 102 mmol/L (ref 96–106)
Creatinine, Ser: 0.64 mg/dL (ref 0.57–1.00)
GFR calc Af Amer: 119 mL/min/{1.73_m2} (ref >59)
GFR calc non Af Amer: 104 mL/min/{1.73_m2} (ref >59)
Globulin, Total: 2.1 g/dL (ref 1.5–4.5)
Glucose: 97 mg/dL (ref 65–99)
Potassium: 4.2 mmol/L (ref 3.5–5.2)
Sodium: 141 mmol/L (ref 134–144)
Total Protein: 6.7 g/dL (ref 6.0–8.5)

## 2019-12-09 LAB — CERVICOVAGINAL ANCILLARY ONLY
Bacterial Vaginitis (gardnerella): POSITIVE — AB
Candida Glabrata: NEGATIVE
Candida Vaginitis: NEGATIVE
Chlamydia: NEGATIVE
Comment: NEGATIVE
Comment: NEGATIVE
Comment: NEGATIVE
Comment: NEGATIVE
Comment: NEGATIVE
Comment: NORMAL
Neisseria Gonorrhea: NEGATIVE
Trichomonas: NEGATIVE

## 2019-12-09 LAB — CYTOLOGY - PAP
Comment: NEGATIVE
Diagnosis: NEGATIVE
High risk HPV: NEGATIVE

## 2019-12-09 LAB — CBC
Hematocrit: 41.6 % (ref 34.0–46.6)
Hemoglobin: 13.7 g/dL (ref 11.1–15.9)
MCH: 29 pg (ref 26.6–33.0)
MCHC: 32.9 g/dL (ref 31.5–35.7)
MCV: 88 fL (ref 79–97)
Platelets: 308 10*3/uL (ref 150–450)
RBC: 4.73 x10E6/uL (ref 3.77–5.28)
RDW: 12.2 % (ref 11.7–15.4)
WBC: 10.2 10*3/uL (ref 3.4–10.8)

## 2019-12-09 LAB — LIPID PANEL
Chol/HDL Ratio: 4.1 ratio (ref 0.0–4.4)
Cholesterol, Total: 253 mg/dL — ABNORMAL HIGH (ref 100–199)
HDL: 61 mg/dL (ref >39)
LDL Chol Calc (NIH): 169 mg/dL — ABNORMAL HIGH (ref 0–99)
Triglycerides: 127 mg/dL (ref 0–149)
VLDL Cholesterol Cal: 23 mg/dL (ref 5–40)

## 2019-12-09 MED ORDER — FLUCONAZOLE 150 MG PO TABS: 150.0000 mg | ORAL_TABLET | Freq: Once | ORAL | 0 refills | Status: AC

## 2019-12-09 NOTE — Pre-Procedure Instructions (Signed)
[facility], [place] [address] [place] [address] Phone: [phone] Fax: [phone]    Your procedure is scheduled on Mon., December 14, 2019 from 12:30PM-2:47PM  Report to [facility] Entrance "A" at 10:30AM  Call this number if you have problems the morning of surgery:  [phone]   Remember:  Do not eat or drink after midnight on June 13th      Take these medicines the morning of surgery with A SIP OF WATER: AmLODipine (NORVASC) BusPIRone (BUSPAR)     Omeprazole (PRILOSEC)  As of today, STOP taking all Aspirin (unless instructed by your doctor) and Other Aspirin containing products, Vitamins, Fish oils, and Herbal medications. Also stop all NSAIDS i.e. Advil, Ibuprofen, Motrin, Aleve, Anaprox, Naproxen, BC, Goody Powders, and all Supplements.  No Smoking of any kind, Tobacco, or Alcohol products 24 hours prior to your procedure. If you use a Cpap at night, you may bring all equipment for your overnight stay.   Do not wear jewelry, make-up or nail polish.  Do not wear lotions, powders, or perfumes, or deodorant.  Do not shave 48 hours prior to surgery.    Do not bring valuables to the hospital.  [facility] is not responsible for any belongings or valuables.  Contacts, dentures or bridgework may not be worn into surgery.   For patients admitted to the hospital, discharge time will be determined by your treatment team.  Patients discharged the day of surgery will not be allowed to drive home, and someone age 52 and over needs to stay with them for 24 hours.   Special instructions:  [place]- Preparing For Surgery  Before surgery, you can play an important role. Because skin is not sterile, your skin needs to be as free of germs as possible. You can reduce the number of germs on your skin by washing with CHG (chlorahexidine gluconate) Soap before surgery.  CHG is an antiseptic cleaner which kills germs  and bonds with the skin to continue killing germs even after washing.    Oral Hygiene is also important to reduce your risk of infection.  Remember - BRUSH YOUR TEETH THE MORNING OF SURGERY WITH YOUR REGULAR TOOTHPASTE  Please do not use if you have an allergy to CHG or antibacterial soaps. If your skin becomes reddened/irritated stop using the CHG.  Do not shave (including legs and underarms) for at least 48 hours prior to first CHG shower. It is OK to shave your face.  Please follow these instructions carefully.   1. Shower the NIGHT BEFORE SURGERY and the MORNING OF SURGERY with CHG.   2. If you chose to wash your hair, wash your hair first as usual with your normal shampoo.  3. After you shampoo, rinse your hair and body thoroughly to remove the shampoo.  4. Use CHG as you would any other liquid soap. You can apply CHG directly to the skin and wash gently with a scrungie or a clean washcloth.   5. Apply the CHG Soap to your body ONLY FROM THE NECK DOWN.  Do not use on open wounds or open sores. Avoid contact with your eyes, ears, mouth and genitals (private parts). Wash Face and genitals (private parts)  with your normal soap.  6. Wash thoroughly, paying special attention to the area where your surgery will be performed.  7. Thoroughly rinse your body with warm water from the neck down.  8. DO NOT shower/wash  with your normal soap after using and rinsing off the CHG Soap.  9. Pat yourself dry with a CLEAN TOWEL.  10. Wear CLEAN PAJAMAS to bed the night before surgery, wear comfortable clothes the morning of surgery  11. Place CLEAN SHEETS on your bed the night of your first shower and DO NOT SLEEP WITH PETS.   Day of Surgery:  Do not apply any deodorants/lotions.  Please wear clean clothes to the hospital/surgery center.   Remember to brush your teeth WITH YOUR REGULAR TOOTHPASTE.  Please read over the following fact sheets that you were given.

## 2019-12-10 ENCOUNTER — Other Ambulatory Visit (HOSPITAL_COMMUNITY)
Admission: RE | Admit: 2019-12-10 | Discharge: 2019-12-10 | Disposition: A | Discharge status: Home / Self Care | Payer: Medicaid Other | Source: Ambulatory Visit | Attending: Orthopaedic Surgery | Admitting: Orthopaedic Surgery

## 2019-12-10 ENCOUNTER — Other Ambulatory Visit: Payer: Self-pay

## 2019-12-10 ENCOUNTER — Encounter (HOSPITAL_COMMUNITY)
Admission: RE | Admit: 2019-12-10 | Discharge: 2019-12-10 | Disposition: A | Discharge status: Home / Self Care | Payer: Medicaid Other | Source: Ambulatory Visit | Attending: Orthopaedic Surgery | Admitting: Orthopaedic Surgery

## 2019-12-10 ENCOUNTER — Encounter (HOSPITAL_COMMUNITY): Payer: Self-pay

## 2019-12-10 DIAGNOSIS — Z01812 Encounter for preprocedural laboratory examination: Secondary | ICD-10-CM | POA: Diagnosis present

## 2019-12-10 DIAGNOSIS — Z20822 Contact with and (suspected) exposure to covid-19: Secondary | ICD-10-CM | POA: Insufficient documentation

## 2019-12-10 HISTORY — DX: Essential (primary) hypertension: I10

## 2019-12-10 LAB — SARS CORONAVIRUS 2 (TAT 6-24 HRS): SARS Coronavirus 2: NEGATIVE

## 2019-12-10 LAB — SURGICAL PCR SCREEN
MRSA, PCR: NEGATIVE
Staphylococcus aureus: POSITIVE — AB

## 2019-12-10 NOTE — Progress Notes (Signed)
PCP - Dr. Barbaraann [facility] Cardiologist - Denies  EKG - 12/08/19 Stress Test - Denies ECHO - Denies Cardiac Cath - Denies DM - Denies  COVID TEST- 12/10/19  Anesthesia review: Not needed  Patient denies shortness of breath, fever, cough and chest pain at PAT appointment  All instructions explained to the patient, with a verbal understanding of the material. Patient agrees to go over the instructions while at home for a better understanding. Patient also instructed to self quarantine after being tested for COVID-19. The opportunity to ask questions was provided.

## 2019-12-14 ENCOUNTER — Encounter (HOSPITAL_COMMUNITY)
Admission: RE | Disposition: A | Discharge status: Home / Self Care | Payer: Self-pay | Source: Home / Self Care | Attending: Orthopaedic Surgery

## 2019-12-14 ENCOUNTER — Ambulatory Visit (HOSPITAL_COMMUNITY): Payer: Self-pay

## 2019-12-14 ENCOUNTER — Other Ambulatory Visit: Payer: Self-pay

## 2019-12-14 ENCOUNTER — Observation Stay (HOSPITAL_COMMUNITY): Payer: Self-pay

## 2019-12-14 ENCOUNTER — Observation Stay (HOSPITAL_COMMUNITY)
Admission: RE | Admit: 2019-12-14 | Discharge: 2019-12-15 | Disposition: A | Discharge status: Home / Self Care | Payer: Self-pay | Attending: Orthopaedic Surgery | Admitting: Orthopaedic Surgery

## 2019-12-14 ENCOUNTER — Ambulatory Visit (HOSPITAL_COMMUNITY): Payer: Self-pay | Admitting: Certified Registered Nurse Anesthetist

## 2019-12-14 ENCOUNTER — Encounter (HOSPITAL_COMMUNITY): Payer: Self-pay | Admitting: Orthopaedic Surgery

## 2019-12-14 DIAGNOSIS — S72002K Fracture of unspecified part of neck of left femur, subsequent encounter for closed fracture with nonunion: Secondary | ICD-10-CM

## 2019-12-14 DIAGNOSIS — D649 Anemia, unspecified: Secondary | ICD-10-CM | POA: Insufficient documentation

## 2019-12-14 DIAGNOSIS — M1612 Unilateral primary osteoarthritis, left hip: Secondary | ICD-10-CM | POA: Insufficient documentation

## 2019-12-14 DIAGNOSIS — M818 Other osteoporosis without current pathological fracture: Secondary | ICD-10-CM | POA: Insufficient documentation

## 2019-12-14 DIAGNOSIS — K219 Gastro-esophageal reflux disease without esophagitis: Secondary | ICD-10-CM | POA: Insufficient documentation

## 2019-12-14 DIAGNOSIS — X58XXXD Exposure to other specified factors, subsequent encounter: Secondary | ICD-10-CM | POA: Insufficient documentation

## 2019-12-14 DIAGNOSIS — F329 Major depressive disorder, single episode, unspecified: Secondary | ICD-10-CM | POA: Insufficient documentation

## 2019-12-14 DIAGNOSIS — F17210 Nicotine dependence, cigarettes, uncomplicated: Secondary | ICD-10-CM | POA: Insufficient documentation

## 2019-12-14 DIAGNOSIS — Z79899 Other long term (current) drug therapy: Secondary | ICD-10-CM | POA: Insufficient documentation

## 2019-12-14 DIAGNOSIS — Z419 Encounter for procedure for purposes other than remedying health state, unspecified: Secondary | ICD-10-CM

## 2019-12-14 DIAGNOSIS — F419 Anxiety disorder, unspecified: Secondary | ICD-10-CM | POA: Insufficient documentation

## 2019-12-14 DIAGNOSIS — Z09 Encounter for follow-up examination after completed treatment for conditions other than malignant neoplasm: Secondary | ICD-10-CM

## 2019-12-14 DIAGNOSIS — I10 Essential (primary) hypertension: Secondary | ICD-10-CM | POA: Insufficient documentation

## 2019-12-14 HISTORY — PX: TOTAL HIP ARTHROPLASTY: SHX124

## 2019-12-14 SURGERY — ARTHROPLASTY, HIP, TOTAL, ANTERIOR APPROACH
Anesthesia: Spinal | Site: Hip | Laterality: Left

## 2019-12-14 MED ORDER — ACETAMINOPHEN 10 MG/ML IV SOLN: 1000.0000 mg | Freq: Four times a day (QID) | INTRAVENOUS | Status: DC

## 2019-12-14 MED ORDER — FENTANYL CITRATE (PF) 250 MCG/5ML IJ SOLN
INTRAMUSCULAR | Status: DC | PRN
  Administered 2019-12-14 (×5): 50 ug via INTRAVENOUS

## 2019-12-14 MED ORDER — HYDROMORPHONE HCL 1 MG/ML IJ SOLN
0.2500 mg | INTRAMUSCULAR | Status: DC | PRN
  Administered 2019-12-14 (×2): 0.5 mg via INTRAVENOUS

## 2019-12-14 MED ORDER — BUPIVACAINE HCL (PF) 0.5 % IJ SOLN
INTRAMUSCULAR | Status: AC
  Filled 2019-12-14: qty 30

## 2019-12-14 MED ORDER — TRANEXAMIC ACID-NACL 1000-0.7 MG/100ML-% IV SOLN
INTRAVENOUS | Status: AC
  Filled 2019-12-14: qty 100

## 2019-12-14 MED ORDER — HYDROMORPHONE HCL 1 MG/ML IJ SOLN
INTRAMUSCULAR | Status: AC
  Administered 2019-12-14: 0.5 mg via INTRAVENOUS
  Filled 2019-12-14: qty 1

## 2019-12-14 MED ORDER — POLYETHYLENE GLYCOL 3350 17 G PO PACK: 17.0000 g | PACK | Freq: Every day | ORAL | Status: DC | PRN

## 2019-12-14 MED ORDER — ASPIRIN EC 325 MG PO TBEC
325.0000 mg | DELAYED_RELEASE_TABLET | Freq: Every day | ORAL | Status: DC
  Administered 2019-12-15: 325 mg via ORAL
  Filled 2019-12-14: qty 1

## 2019-12-14 MED ORDER — HYDROMORPHONE HCL 1 MG/ML IJ SOLN
0.5000 mg | INTRAMUSCULAR | Status: DC | PRN
  Administered 2019-12-14: 0.5 mg via INTRAVENOUS

## 2019-12-14 MED ORDER — MENTHOL 3 MG MT LOZG: 1.0000 | LOZENGE | OROMUCOSAL | Status: DC | PRN

## 2019-12-14 MED ORDER — ACETAMINOPHEN 10 MG/ML IV SOLN
INTRAVENOUS | Status: AC
  Administered 2019-12-14: 1000 mg via INTRAVENOUS
  Filled 2019-12-14: qty 100

## 2019-12-14 MED ORDER — PROPOFOL 1000 MG/100ML IV EMUL
INTRAVENOUS | Status: AC
  Filled 2019-12-14: qty 100

## 2019-12-14 MED ORDER — DOCUSATE SODIUM 100 MG PO CAPS
100.0000 mg | ORAL_CAPSULE | Freq: Two times a day (BID) | ORAL | Status: DC
  Administered 2019-12-14 – 2019-12-15 (×2): 100 mg via ORAL
  Filled 2019-12-14 (×2): qty 1

## 2019-12-14 MED ORDER — HYDROMORPHONE HCL 1 MG/ML IJ SOLN
INTRAMUSCULAR | Status: AC
  Filled 2019-12-14: qty 0.5

## 2019-12-14 MED ORDER — FENTANYL CITRATE (PF) 250 MCG/5ML IJ SOLN
INTRAMUSCULAR | Status: AC
  Filled 2019-12-14: qty 5

## 2019-12-14 MED ORDER — TRAZODONE HCL 100 MG PO TABS
100.0000 mg | ORAL_TABLET | Freq: Every day | ORAL | Status: DC
  Administered 2019-12-14: 100 mg via ORAL
  Filled 2019-12-14 (×2): qty 1

## 2019-12-14 MED ORDER — EPINEPHRINE PF 1 MG/ML IJ SOLN
INTRAMUSCULAR | Status: AC
  Filled 2019-12-14: qty 1

## 2019-12-14 MED ORDER — MAGNESIUM OXIDE 400 (241.3 MG) MG PO TABS
400.0000 mg | ORAL_TABLET | Freq: Every day | ORAL | Status: DC
  Administered 2019-12-15: 400 mg via ORAL
  Filled 2019-12-14 (×3): qty 1

## 2019-12-14 MED ORDER — EPHEDRINE SULFATE-NACL 50-0.9 MG/10ML-% IV SOSY
PREFILLED_SYRINGE | INTRAVENOUS | Status: DC | PRN
  Administered 2019-12-14 (×3): 5 mg via INTRAVENOUS
  Administered 2019-12-14: 10 mg via INTRAVENOUS

## 2019-12-14 MED ORDER — ONDANSETRON HCL 4 MG PO TABS: 4.0000 mg | ORAL_TABLET | Freq: Four times a day (QID) | ORAL | Status: DC | PRN

## 2019-12-14 MED ORDER — PHENYLEPHRINE 40 MCG/ML (10ML) SYRINGE FOR IV PUSH (FOR BLOOD PRESSURE SUPPORT)
PREFILLED_SYRINGE | INTRAVENOUS | Status: DC | PRN
  Administered 2019-12-14 (×2): 80 ug via INTRAVENOUS

## 2019-12-14 MED ORDER — KETOROLAC TROMETHAMINE 30 MG/ML IJ SOLN: 30.0000 mg | Freq: Once | INTRAMUSCULAR | Status: AC

## 2019-12-14 MED ORDER — CEFAZOLIN SODIUM-DEXTROSE 2-4 GM/100ML-% IV SOLN
INTRAVENOUS | Status: AC
  Filled 2019-12-14: qty 100

## 2019-12-14 MED ORDER — BUSPIRONE HCL 15 MG PO TABS
30.0000 mg | ORAL_TABLET | Freq: Two times a day (BID) | ORAL | Status: DC
  Administered 2019-12-15: 30 mg via ORAL
  Filled 2019-12-14 (×3): qty 2

## 2019-12-14 MED ORDER — FENTANYL CITRATE (PF) 100 MCG/2ML IJ SOLN: 25.0000 ug | INTRAMUSCULAR | Status: DC | PRN

## 2019-12-14 MED ORDER — OXYCODONE HCL 5 MG/5ML PO SOLN: 5.0000 mg | Freq: Once | ORAL | Status: DC | PRN

## 2019-12-14 MED ORDER — METHOCARBAMOL 500 MG PO TABS
500.0000 mg | ORAL_TABLET | Freq: Four times a day (QID) | ORAL | Status: DC | PRN
  Administered 2019-12-14 – 2019-12-15 (×2): 500 mg via ORAL
  Filled 2019-12-14 (×2): qty 1

## 2019-12-14 MED ORDER — PROPOFOL 10 MG/ML IV BOLUS
INTRAVENOUS | Status: AC
  Filled 2019-12-14: qty 20

## 2019-12-14 MED ORDER — ACETAMINOPHEN 325 MG PO TABS
325.0000 mg | ORAL_TABLET | Freq: Four times a day (QID) | ORAL | Status: DC | PRN
  Administered 2019-12-15: 650 mg via ORAL
  Filled 2019-12-14: qty 2

## 2019-12-14 MED ORDER — METOCLOPRAMIDE HCL 5 MG PO TABS: 5.0000 mg | ORAL_TABLET | Freq: Three times a day (TID) | ORAL | Status: DC | PRN

## 2019-12-14 MED ORDER — LACTATED RINGERS IV SOLN: INTRAVENOUS | Status: DC

## 2019-12-14 MED ORDER — PROMETHAZINE HCL 25 MG/ML IJ SOLN: 6.2500 mg | INTRAMUSCULAR | Status: DC | PRN

## 2019-12-14 MED ORDER — OXYCODONE HCL 5 MG PO TABS
5.0000 mg | ORAL_TABLET | ORAL | Status: DC | PRN
  Administered 2019-12-14 – 2019-12-15 (×5): 10 mg via ORAL
  Filled 2019-12-14 (×5): qty 2

## 2019-12-14 MED ORDER — OXYCODONE HCL 5 MG PO TABS: 5.0000 mg | ORAL_TABLET | Freq: Once | ORAL | Status: DC | PRN

## 2019-12-14 MED ORDER — BUPIVACAINE IN DEXTROSE 0.75-8.25 % IT SOLN
INTRATHECAL | Status: DC | PRN
Start: 2019-12-14 — End: 2019-12-14
  Administered 2019-12-14: 2 mL via INTRATHECAL

## 2019-12-14 MED ORDER — HYDROMORPHONE HCL 1 MG/ML IJ SOLN
INTRAMUSCULAR | Status: DC | PRN
  Administered 2019-12-14: .5 mg via INTRAVENOUS

## 2019-12-14 MED ORDER — BUPIVACAINE HCL (PF) 0.25 % IJ SOLN
INTRAMUSCULAR | Status: AC
  Filled 2019-12-14: qty 30

## 2019-12-14 MED ORDER — ONDANSETRON HCL 4 MG/2ML IJ SOLN: 4.0000 mg | Freq: Four times a day (QID) | INTRAMUSCULAR | Status: DC | PRN

## 2019-12-14 MED ORDER — EPHEDRINE 5 MG/ML INJ
INTRAVENOUS | Status: AC
  Filled 2019-12-14: qty 10

## 2019-12-14 MED ORDER — PROPOFOL 500 MG/50ML IV EMUL
INTRAVENOUS | Status: DC | PRN
Start: 2019-12-14 — End: 2019-12-14
  Administered 2019-12-14: 75 ug/kg/min via INTRAVENOUS

## 2019-12-14 MED ORDER — AMLODIPINE BESYLATE 5 MG PO TABS
5.0000 mg | ORAL_TABLET | Freq: Every day | ORAL | Status: DC
  Administered 2019-12-15: 5 mg via ORAL
  Filled 2019-12-14: qty 1

## 2019-12-14 MED ORDER — SODIUM CHLORIDE 0.9 % IV SOLN: INTRAVENOUS | Status: DC

## 2019-12-14 MED ORDER — METHOCARBAMOL 1000 MG/10ML IJ SOLN
500.0000 mg | Freq: Four times a day (QID) | INTRAVENOUS | Status: DC | PRN
  Filled 2019-12-14: qty 5

## 2019-12-14 MED ORDER — METHOCARBAMOL 500 MG PO TABS
ORAL_TABLET | ORAL | Status: AC
  Administered 2019-12-14: 500 mg via ORAL
  Filled 2019-12-14: qty 1

## 2019-12-14 MED ORDER — BUPIVACAINE LIPOSOME 1.3 % IJ SUSP
20.0000 mL | Freq: Once | INTRAMUSCULAR | Status: AC
  Administered 2019-12-14: 10 mL
  Filled 2019-12-14: qty 20

## 2019-12-14 MED ORDER — CEFAZOLIN SODIUM-DEXTROSE 2-3 GM-%(50ML) IV SOLR
INTRAVENOUS | Status: DC | PRN
Start: 2019-12-14 — End: 2019-12-14
  Administered 2019-12-14: 2 g via INTRAVENOUS

## 2019-12-14 MED ORDER — ONDANSETRON HCL 4 MG/2ML IJ SOLN
INTRAMUSCULAR | Status: DC | PRN
  Administered 2019-12-14: 4 mg via INTRAVENOUS

## 2019-12-14 MED ORDER — MIDAZOLAM HCL 2 MG/2ML IJ SOLN
INTRAMUSCULAR | Status: AC
  Filled 2019-12-14: qty 2

## 2019-12-14 MED ORDER — PHENOL 1.4 % MT LIQD: 1.0000 | OROMUCOSAL | Status: DC | PRN

## 2019-12-14 MED ORDER — POTASSIUM 99 MG PO TABS: 99.0000 mg | ORAL_TABLET | Freq: Every day | ORAL | Status: DC

## 2019-12-14 MED ORDER — PANTOPRAZOLE SODIUM 40 MG PO TBEC
40.0000 mg | DELAYED_RELEASE_TABLET | Freq: Every day | ORAL | Status: DC
  Administered 2019-12-15: 40 mg via ORAL
  Filled 2019-12-14: qty 1

## 2019-12-14 MED ORDER — CHLORHEXIDINE GLUCONATE 0.12 % MT SOLN
15.0000 mL | Freq: Once | OROMUCOSAL | Status: AC
  Administered 2019-12-14: 15 mL via OROMUCOSAL
  Filled 2019-12-14: qty 15

## 2019-12-14 MED ORDER — DEXAMETHASONE SODIUM PHOSPHATE 10 MG/ML IJ SOLN
INTRAMUSCULAR | Status: AC
  Filled 2019-12-14: qty 1

## 2019-12-14 MED ORDER — METOCLOPRAMIDE HCL 5 MG/ML IJ SOLN: 5.0000 mg | Freq: Three times a day (TID) | INTRAMUSCULAR | Status: DC | PRN

## 2019-12-14 MED ORDER — DEXAMETHASONE SODIUM PHOSPHATE 10 MG/ML IJ SOLN
INTRAMUSCULAR | Status: DC | PRN
Start: 2019-12-14 — End: 2019-12-14
  Administered 2019-12-14: 4 mg via INTRAVENOUS

## 2019-12-14 MED ORDER — KETOROLAC TROMETHAMINE 30 MG/ML IJ SOLN
INTRAMUSCULAR | Status: AC
  Administered 2019-12-14: 30 mg via INTRAVENOUS
  Filled 2019-12-14: qty 1

## 2019-12-14 MED ORDER — BUPIVACAINE HCL 0.25 % IJ SOLN
INTRAMUSCULAR | Status: DC | PRN
Start: 2019-12-14 — End: 2019-12-14
  Administered 2019-12-14: 10 mL

## 2019-12-14 MED ORDER — ONDANSETRON HCL 4 MG/2ML IJ SOLN
INTRAMUSCULAR | Status: AC
  Filled 2019-12-14: qty 2

## 2019-12-14 MED ORDER — ORAL CARE MOUTH RINSE: 15.0000 mL | Freq: Once | OROMUCOSAL | Status: AC

## 2019-12-14 MED ORDER — MIDAZOLAM HCL 2 MG/2ML IJ SOLN
INTRAMUSCULAR | Status: DC | PRN
Start: 2019-12-14 — End: 2019-12-14
  Administered 2019-12-14: 2 mg via INTRAVENOUS

## 2019-12-14 MED ORDER — ALBUMIN HUMAN 5 % IV SOLN
INTRAVENOUS | Status: DC | PRN
Start: 2019-12-14 — End: 2019-12-14

## 2019-12-14 MED ORDER — HYDROCHLOROTHIAZIDE 25 MG PO TABS
25.0000 mg | ORAL_TABLET | Freq: Every day | ORAL | Status: DC
  Filled 2019-12-14: qty 1

## 2019-12-14 MED ORDER — TRANEXAMIC ACID 1000 MG/10ML IV SOLN
INTRAVENOUS | Status: DC | PRN
  Administered 2019-12-14: 1000 mg via INTRAVENOUS

## 2019-12-14 MED ORDER — HYDROMORPHONE HCL 1 MG/ML IJ SOLN: 0.5000 mg | INTRAMUSCULAR | Status: DC | PRN

## 2019-12-14 SURGICAL SUPPLY — 63 items
ADH SKN CLS APL DERMABOND .7 (GAUZE/BANDAGES/DRESSINGS) ×1
APL SKNCLS STERI-STRIP NONHPOA (GAUZE/BANDAGES/DRESSINGS) ×1
BALL HIP CERAMIC 32MM PLUS 9 ×1 IMPLANT
BENZOIN TINCTURE PRP APPL 2/3 (GAUZE/BANDAGES/DRESSINGS) ×3 IMPLANT
BLADE CLIPPER SURG (BLADE) IMPLANT
BLADE SAW SGTL 18X1.27X75 (BLADE) ×2 IMPLANT
BLADE SAW SGTL 18X1.27X75MM (BLADE) ×1
CABLE CERLAGE W/CRIMP 1.8 (Cable) ×4 IMPLANT
CABLE CERLAGE W/CRIMP 1.8MM (Cable) ×2 IMPLANT
CELLS DAT CNTRL 66122 CELL SVR (MISCELLANEOUS) ×1 IMPLANT
CLOSURE WOUND 1/2 X4 (GAUZE/BANDAGES/DRESSINGS) ×1
COVER SURGICAL LIGHT HANDLE (MISCELLANEOUS) ×3 IMPLANT
COVER WAND RF STERILE (DRAPES) ×3 IMPLANT
DERMABOND ADVANCED (GAUZE/BANDAGES/DRESSINGS) ×2
DERMABOND ADVANCED .7 DNX12 (GAUZE/BANDAGES/DRESSINGS) ×1 IMPLANT
DRAPE C-ARM 42X72 X-RAY (DRAPES) ×3 IMPLANT
DRAPE IMP U-DRAPE 54X76 (DRAPES) ×3 IMPLANT
DRAPE STERI IOBAN 125X83 (DRAPES) ×6 IMPLANT
DRAPE U-SHAPE 47X51 STRL (DRAPES) ×9 IMPLANT
DRSG MEPILEX BORDER 4X8 (GAUZE/BANDAGES/DRESSINGS) ×3 IMPLANT
DURAPREP 26ML APPLICATOR (WOUND CARE) ×3 IMPLANT
ELECT BLADE 4.0 EZ CLEAN MEGAD (MISCELLANEOUS) ×3
ELECT CAUTERY BLADE 6.4 (BLADE) ×3 IMPLANT
ELECT REM PT RETURN 9FT ADLT (ELECTROSURGICAL) ×3
ELECTRODE BLDE 4.0 EZ CLN MEGD (MISCELLANEOUS) ×1 IMPLANT
ELECTRODE REM PT RTRN 9FT ADLT (ELECTROSURGICAL) ×1 IMPLANT
ELIMINATOR HOLE APEX DEPUY (Hips) ×3 IMPLANT
FACESHIELD WRAPAROUND (MASK) ×6 IMPLANT
FEM STEM 12/14 TAPER SZ 4 HIP (Orthopedic Implant) ×3 IMPLANT
FEMORAL STEM 12/14 TPR SZ4 HIP (Orthopedic Implant) ×1 IMPLANT
GLOVE BIOGEL PI IND STRL 8 (GLOVE) ×2 IMPLANT
GLOVE BIOGEL PI INDICATOR 8 (GLOVE) ×4
GLOVE ORTHO TXT STRL SZ7.5 (GLOVE) ×6 IMPLANT
GOWN STRL REUS W/ TWL LRG LVL3 (GOWN DISPOSABLE) ×1 IMPLANT
GOWN STRL REUS W/ TWL XL LVL3 (GOWN DISPOSABLE) ×1 IMPLANT
GOWN STRL REUS W/TWL 2XL LVL3 (GOWN DISPOSABLE) ×3 IMPLANT
GOWN STRL REUS W/TWL LRG LVL3 (GOWN DISPOSABLE) ×3
GOWN STRL REUS W/TWL XL LVL3 (GOWN DISPOSABLE) ×3
HIP BALL CERAMIC 32MM PLUS 9 ×3 IMPLANT
KIT BASIN OR (CUSTOM PROCEDURE TRAY) ×3 IMPLANT
KIT TURNOVER KIT B (KITS) ×3 IMPLANT
LINER ACET PNNCL PLUS4 NEUTRAL (Hips) ×1 IMPLANT
LINER PINN ACET GRIP 50X100 ×3 IMPLANT
MANIFOLD NEPTUNE II (INSTRUMENTS) ×3 IMPLANT
NEEDLE 22X1 1/2 (OR ONLY) (NEEDLE) ×3 IMPLANT
NS IRRIG 1000ML POUR BTL (IV SOLUTION) ×3 IMPLANT
PACK TOTAL JOINT (CUSTOM PROCEDURE TRAY) ×3 IMPLANT
PAD ARMBOARD 7.5X6 YLW CONV (MISCELLANEOUS) ×6 IMPLANT
PINNACLE PLUS 4 NEUTRAL (Hips) ×3 IMPLANT
RTRCTR WOUND ALEXIS 18CM MED (MISCELLANEOUS) ×3
STRIP CLOSURE SKIN 1/2X4 (GAUZE/BANDAGES/DRESSINGS) ×2 IMPLANT
SUT VIC AB 0 CT1 27 (SUTURE) ×3
SUT VIC AB 0 CT1 27XBRD ANBCTR (SUTURE) ×1 IMPLANT
SUT VIC AB 2-0 CT1 27 (SUTURE) ×3
SUT VIC AB 2-0 CT1 TAPERPNT 27 (SUTURE) ×1 IMPLANT
SUT VICRYL 4-0 PS2 18IN ABS (SUTURE) ×3 IMPLANT
SUT VLOC 180 0 24IN GS25 (SUTURE) ×3 IMPLANT
SYR CONTROL 10ML LL (SYRINGE) ×3 IMPLANT
TOWEL GREEN STERILE (TOWEL DISPOSABLE) ×6 IMPLANT
TOWEL GREEN STERILE FF (TOWEL DISPOSABLE) ×3 IMPLANT
TRAY CATH 16FR W/PLASTIC CATH (SET/KITS/TRAYS/PACK) IMPLANT
TRAY FOLEY MTR SLVR 16FR STAT (SET/KITS/TRAYS/PACK) IMPLANT
WATER STERILE IRR 1000ML POUR (IV SOLUTION) ×6 IMPLANT

## 2019-12-14 NOTE — Interval H&P Note (Signed)
History and Physical Interval Note:  12/14/2019 12:33 PM  Ashley [name]  has presented today for surgery, with the diagnosis of left femoral neck nonunion.  The various methods of treatment have been discussed with the patient and family. After consideration of risks, benefits and other options for treatment, the patient has consented to  Procedure(s): LEFT TOTAL HIP ARTHROPLASTY-DIRECT ANTERIOR (Left) as a surgical intervention.  The patient's history has been reviewed, patient examined, no change in status, stable for surgery.  I have reviewed the patient's chart and labs.  Questions were answered to the patient's satisfaction.     Eldred Manges

## 2019-12-14 NOTE — Transfer of Care (Signed)
Immediate Anesthesia Transfer of Care Note  Patient: Ashley [name]  Procedure(s) Performed: LEFT TOTAL HIP ARTHROPLASTY-DIRECT ANTERIOR (Left Hip)  Patient Location: PACU  Anesthesia Type:Spinal  Level of Consciousness: awake, alert  and oriented  Airway & Oxygen Therapy: Patient Spontanous Breathing and Patient connected to face mask oxygen  Post-op Assessment: Report given to RN and Post -op Vital signs reviewed and stable  Post vital signs: Reviewed and stable  Last Vitals:  Vitals Value Taken Time  BP 108/72 12/14/19 1534  Temp    Pulse 83 12/14/19 1537  Resp 18 12/14/19 1537  SpO2 94 % 12/14/19 1537  Vitals shown include unvalidated device data.  Last Pain:  Vitals:   12/14/19 1100  TempSrc:   PainSc: 7       Patients Stated Pain Goal: 2 (12/14/19 1100)  Complications: No complications documented.

## 2019-12-14 NOTE — Progress Notes (Signed)
PHARMACIST - PHYSICIAN ORDER COMMUNICATION  CONCERNING: P&T Medication Policy on Herbal Medications  DESCRIPTION:  This patient's order for:  Potassium 99 mg (2.53 Eq) has been noted.  This product(s) is classified as an "herbal" or natural product. Due to a lack of definitive safety studies or FDA approval, nonstandard manufacturing practices, plus the potential risk of unknown drug-drug interactions while on inpatient medications, the Pharmacy and Therapeutics Committee does not permit the use of "herbal" or natural products of this type within [facility].   ACTION TAKEN: The pharmacy department is unable to verify this order at this time and your patient has been informed of this safety policy. Please reevaluate patient's clinical condition at discharge and address if the herbal or natural product(s) should be resumed at that time.  Vicki Mallet, PharmD, BCPS, [facility] Clinical Pharmacist

## 2019-12-14 NOTE — Anesthesia Postprocedure Evaluation (Signed)
Anesthesia Post Note  Patient: Ashley [name]  Procedure(s) Performed: LEFT TOTAL HIP ARTHROPLASTY-DIRECT ANTERIOR (Left Hip)     Patient location during evaluation: PACU Anesthesia Type: Spinal Level of consciousness: oriented and awake and alert Pain management: pain level controlled Vital Signs Assessment: post-procedure vital signs reviewed and stable Respiratory status: spontaneous breathing, respiratory function stable and patient connected to nasal cannula oxygen Cardiovascular status: blood pressure returned to baseline and stable Postop Assessment: no headache, no backache and no apparent nausea or vomiting Anesthetic complications: no   No complications documented.  Last Vitals:  Vitals:   12/14/19 1720 12/14/19 1737  BP: 112/61 119/73  Pulse: 71 75  Resp: 17 20  Temp:  36.6 C  SpO2: 97% 100%    Last Pain:  Vitals:   12/14/19 1737  TempSrc: Oral  PainSc:                  [name]

## 2019-12-14 NOTE — Anesthesia Procedure Notes (Signed)
Spinal  Patient location during procedure: OR Start time: 12/14/2019 12:48 PM End time: 12/14/2019 12:52 PM Staffing Performed: anesthesiologist  Anesthesiologist: Beryle Lathe, MD Preanesthetic Checklist Completed: patient identified, IV checked, risks and benefits discussed, surgical consent, monitors and equipment checked, pre-op evaluation and timeout performed Spinal Block Patient position: sitting Prep: DuraPrep Patient monitoring: heart rate, cardiac monitor, continuous pulse ox and blood pressure Approach: midline Location: L3-4 Injection technique: single-shot Needle Needle type: Pencan  Needle gauge: 24 G Additional Notes Consent was obtained prior to the procedure with all questions answered and concerns addressed. Risks including, but not limited to, bleeding, infection, nerve damage, paralysis, failed block, inadequate analgesia, allergic reaction, high spinal, itching, and headache were discussed and the patient wished to proceed. Functioning IV was confirmed and monitors were applied. Sterile prep and drape, including hand hygiene, mask, and sterile gloves were used. The patient was positioned and the spine was prepped. The skin was anesthetized with lidocaine. Free flow of clear CSF was obtained prior to injecting local anesthetic into the CSF. The spinal needle aspirated freely following injection. The needle was carefully withdrawn. The patient tolerated the procedure well.   Leslye Peer, MD
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
## 2019-12-14 NOTE — H&P (Signed)
TOTAL HIP ADMISSION H&P  Patient is admitted for left total hip arthroplasty.  Subjective:  Chief Complaint: left hip pain  HPI: Ashley [name], 54 y.o. female, has a history of pain and functional disability in the left hip(s) due to arthritis and patient has failed non-surgical conservative treatments for greater than 12 weeks to include NSAID's and/or analgesics, use of assistive devices and activity modification.  Onset of symptoms was gradual starting 10 years ago with gradually worsening course since that time Patient currently rates pain in the left hip at 10 out of 10 with activity. Patient has night pain, worsening of pain with activity and weight bearing, trendelenberg gait, pain that interfers with activities of daily living and pain with passive range of motion. Patient has evidence of subchondral cysts, subchondral sclerosis and joint space narrowing by imaging studies. This condition presents safety issues increasing the risk of falls.   There is no current active infection.  Patient Active Problem List   Diagnosis Date Noted  . 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 Symptomatic anemia 03/18/2019  . Hypotension 03/18/2019  . Pyloric ulcer 03/18/2019  . Hypokalemia 03/18/2019  . Hypomagnesemia 03/18/2019  . Upper GI bleed 03/15/2019  . Acute gastroenteritis 01/01/2019  . Intractable nausea and vomiting 12/31/2018  . AKI (acute kidney injury) (HCC) 12/31/2018  . GERD (gastroesophageal reflux disease) 12/31/2018   Past Medical History:  Diagnosis Date  . Anxiety   . Chronic left-sided back pain   . Depression   . GERD (gastroesophageal reflux disease)   . Hypertension     Past Surgical History:  Procedure Laterality Date  . ESOPHAGOGASTRODUODENOSCOPY (EGD) WITH PROPOFOL N/A 03/15/2019   Procedure: ESOPHAGOGASTRODUODENOSCOPY (EGD) WITH PROPOFOL;  Surgeon: Benancio Deeds, MD;  Location: [facility];  Service: Gastroenterology;   Laterality: N/A;  . IR ANGIOGRAM SELECTIVE EACH ADDITIONAL VESSEL  03/15/2019  . IR ANGIOGRAM SELECTIVE EACH ADDITIONAL VESSEL  03/15/2019  . IR ANGIOGRAM VISCERAL SELECTIVE  03/15/2019  . IR EMBO ART  VEN HEMORR LYMPH EXTRAV  INC GUIDE ROADMAPPING  03/15/2019  . IR US GUIDE VASC ACCESS RIGHT  03/15/2019  . TUBAL LIGATION      No current facility-administered medications for this encounter.   Current Outpatient Medications  Medication Sig Dispense Refill Last Dose  . amLODipine (NORVASC) 5 MG tablet Take 1 tablet (5 mg total) by mouth daily. 90 tablet 3   . Ascorbic Acid (VITAMIN C) 1000 MG tablet Take 1,000 mg by mouth daily.     . chlorhexidine (PERIDEX) 0.12 % solution Use as directed 15 mLs in the mouth or throat 2 (two) times daily. PLEASE MAIL (Patient taking differently: Use as directed 15 mLs in the mouth or throat daily. PLEASE MAIL) 473 mL 0   . hydrochlorothiazide (HYDRODIURIL) 25 MG tablet Take 1 tablet (25 mg total) by mouth daily. 90 tablet 3   . magnesium oxide (MAG-OX) 400 MG tablet Take 400 mg by mouth daily.     Marland Kitchen omeprazole (PRILOSEC) 40 MG capsule Take 1 capsule (40 mg total) by mouth 2 (two) times daily. Take 30-60 minutes before breakfast and dinner. 60 capsule 3   . Potassium 99 MG TABS Take 99 mg by mouth daily.     . traZODone (DESYREL) 100 MG tablet Take 1 tablet (100 mg total) by mouth at bedtime. 90 tablet 3    No Known Allergies  Social History   Tobacco Use  . Smoking status: Current Every Day  Smoker    Packs/day: 1.00    Years: 26.00    Pack years: 26.00    Types: Cigarettes  . Smokeless tobacco: Never Used  Substance Use Topics  . Alcohol use: No    Family History  Problem Relation Age of Onset  . Stomach cancer Mother   . Colon cancer Father   . Stomach cancer Sister   . Pancreatic cancer Neg Hx   . Esophageal cancer Neg Hx      Review of Systems  Constitutional: Positive for activity change.  HENT: Negative.   Eyes: Negative.   Respiratory:  Negative.   Cardiovascular: Negative.   Gastrointestinal: Negative.   Genitourinary: Negative.   Musculoskeletal: Positive for gait problem.  Skin: Negative.     Objective:  Physical Exam  Constitutional: She is oriented to person, place, and time.  HENT:  Head: Normocephalic.  Eyes: Pupils are equal, round, and reactive to light.  Cardiovascular: Regular rhythm.  Respiratory: Effort normal. No respiratory distress.  GI: She exhibits no distension.  Musculoskeletal:        General: Tenderness present.     Cervical back: Normal range of motion.  Neurological: She is alert and oriented to person, place, and time.    Vital signs in last 24 hours:    Labs:   Estimated body mass index is 27.22 kg/m as calculated from the following:   Height as of 12/10/19: 5\' 4"  (1.626 m).   Weight as of 12/10/19: 71.9 kg.   Imaging Review Plain radiographs demonstrate moderate degenerative joint disease of the left hip(s). The bone quality appears to be good for age and reported activity level.      Assessment/Plan:  End stage arthritis, left hip(s)  The patient history, physical examination, clinical judgement of the provider and imaging studies are consistent with end stage degenerative joint disease of the left hip(s) and total hip arthroplasty is deemed medically necessary. The treatment options including medical management, injection therapy, arthroscopy and arthroplasty were discussed at length. The risks and benefits of total hip arthroplasty were presented and reviewed. The risks due to aseptic loosening, infection, stiffness, dislocation/subluxation,  thromboembolic complications and other imponderables were discussed.  The patient acknowledged the explanation, agreed to proceed with the plan and consent was signed. Patient is being admitted for inpatient treatment for surgery, pain control, PT, OT, prophylactic antibiotics, VTE prophylaxis, progressive ambulation and ADL's and  discharge planning.The patient is planning to be discharged home with home health services

## 2019-12-14 NOTE — Progress Notes (Signed)
Orthopedic Tech Progress Note Patient Details:  Ashley [name] 05-27-1966 [id number] Applied an Over Head Frame with Trapeze for patient  Patient ID: Ashley [name], female   DOB: [birth year], 54 y.o.   MRN: [id number]   Ashley [name] 12/14/2019, 6:25 PM

## 2019-12-14 NOTE — Anesthesia Preprocedure Evaluation (Addendum)
Anesthesia Evaluation  Patient identified by MRN, date of birth, ID band Patient awake    Reviewed: Allergy & Precautions, NPO status , Patient's Chart, lab work & pertinent test results  History of Anesthesia Complications Negative for: history of anesthetic complications  Airway Mallampati: II  TM Distance: >3 FB Neck ROM: Full    Dental  (+) Dental Advisory Given   Pulmonary Current SmokerPatient did not abstain from smoking.,    Pulmonary exam normal        Cardiovascular hypertension, Pt. on medications Normal cardiovascular exam     Neuro/Psych PSYCHIATRIC DISORDERS Anxiety Depression negative neurological ROS     GI/Hepatic Neg liver ROS, PUD, GERD  Medicated and Controlled,  Endo/Other  negative endocrine ROS  Renal/GU negative Renal ROS     Musculoskeletal negative musculoskeletal ROS (+)   Abdominal   Peds  Hematology negative hematology ROS (+)   Anesthesia Other Findings Covid test negative 6/10  Reproductive/Obstetrics  S/p tubal ligation                             Anesthesia Physical Anesthesia Plan  ASA: II  Anesthesia Plan: Spinal   Post-op Pain Management:    Induction:   PONV Risk Score and Plan: 2 and Treatment may vary due to age or medical condition and Propofol infusion  Airway Management Planned: Natural Airway and Simple Face Mask  Additional Equipment: None  Intra-op Plan:   Post-operative Plan:   Informed Consent: I have reviewed the patients History and Physical, chart, labs and discussed the procedure including the risks, benefits and alternatives for the proposed anesthesia with the patient or authorized representative who has indicated his/her understanding and acceptance.       Plan Discussed with: CRNA and Anesthesiologist  Anesthesia Plan Comments: (Labs reviewed, platelets acceptable. Discussed risks and benefits of spinal,  including spinal/epidural hematoma, infection, failed block, and PDPH. Patient expressed understanding and wished to proceed. )       Anesthesia Quick Evaluation

## 2019-12-14 NOTE — Anesthesia Procedure Notes (Signed)
Procedure Name: MAC Date/Time: 12/14/2019 1:15 PM Performed by: Teressa Lower., CRNA Pre-anesthesia Checklist: Patient identified, Emergency Drugs available, Suction available, Patient being monitored and Timeout performed Patient Re-evaluated:Patient Re-evaluated prior to induction Oxygen Delivery Method: Simple face mask

## 2019-12-15 LAB — BASIC METABOLIC PANEL
Anion gap: 9 (ref 5–15)
BUN: 12 mg/dL (ref 6–20)
CO2: 23 mmol/L (ref 22–32)
Calcium: 8.5 mg/dL — ABNORMAL LOW (ref 8.9–10.3)
Chloride: 107 mmol/L (ref 98–111)
Creatinine, Ser: 0.61 mg/dL (ref 0.44–1.00)
GFR calc Af Amer: >60 mL/min (ref >60)
GFR calc non Af Amer: >60 mL/min (ref >60)
Glucose, Bld: 128 mg/dL — ABNORMAL HIGH (ref 70–99)
Potassium: 3.8 mmol/L (ref 3.5–5.1)
Sodium: 139 mmol/L (ref 135–145)

## 2019-12-15 LAB — CBC
HCT: 28 % — ABNORMAL LOW (ref 36.0–46.0)
Hemoglobin: 9 g/dL — ABNORMAL LOW (ref 12.0–15.0)
MCH: 29.5 pg (ref 26.0–34.0)
MCHC: 32.1 g/dL (ref 30.0–36.0)
MCV: 91.8 fL (ref 80.0–100.0)
Platelets: 202 10*3/uL (ref 150–400)
RBC: 3.05 MIL/uL — ABNORMAL LOW (ref 3.87–5.11)
RDW: 13.1 % (ref 11.5–15.5)
WBC: 12.6 10*3/uL — ABNORMAL HIGH (ref 4.0–10.5)
nRBC: 0 % (ref 0.0–0.2)

## 2019-12-15 MED ORDER — ASPIRIN 325 MG PO TABS: 325.0000 mg | ORAL_TABLET | Freq: Every day | ORAL | Status: AC

## 2019-12-15 MED ORDER — METHOCARBAMOL 500 MG PO TABS: 500.0000 mg | ORAL_TABLET | Freq: Four times a day (QID) | ORAL | 0 refills | Status: DC

## 2019-12-15 MED ORDER — OXYCODONE-ACETAMINOPHEN 5-325 MG PO TABS: 1.0000 | ORAL_TABLET | Freq: Four times a day (QID) | ORAL | 0 refills | Status: DC | PRN

## 2019-12-15 NOTE — Progress Notes (Signed)
   Subjective: 1 Day Post-Op Procedure(s) (LRB): LEFT TOTAL HIP ARTHROPLASTY-DIRECT ANTERIOR (Left) Patient reports pain as moderate.    Objective: Vital signs in last 24 hours: Temp:  [97.3 F (36.3 C)-98.7 F (37.1 C)] 98.6 F (37 C) (06/15 0708) Pulse Rate:  [68-108] 80 (06/15 0708) Resp:  [11-26] 18 (06/15 0708) BP: (93-119)/(52-78) 113/56 (06/15 0708) SpO2:  [97 %-100 %] 100 % (06/15 0708) Weight:  [72.6 kg] 72.6 kg (06/14 1042)  Intake/Output from previous day: 06/14 0701 - 06/15 0700 In: 2100 [I.V.:1500; IV Piggyback:600] Out: 2175 [Urine:1375; Blood:800] Intake/Output this shift: No intake/output data recorded.  Recent Labs    12/15/19 0635  HGB 9.0*   Recent Labs    12/15/19 0635  WBC 12.6*  RBC 3.05*  HCT 28.0*  PLT 202   Recent Labs    12/15/19 0635  NA 139  K 3.8  CL 107  CO2 23  BUN 12  CREATININE 0.61  GLUCOSE 128*  CALCIUM 8.5*   No results for input(s): LABPT, INR in the last 72 hours.  Neurologically intact DG C-Arm 1-60 Min  Result Date: 12/14/2019 CLINICAL DATA:  Left hip replacement. EXAM: OPERATIVE LEFT HIP (WITH PELVIS IF PERFORMED) 4 VIEWS TECHNIQUE: Fluoroscopic spot image(s) were submitted for interpretation post-operatively. COMPARISON:  12/02/2019 FINDINGS: 4 intraoperative fluoroscopic images are provided. The first 2 images again demonstrate a basicervical left femoral neck fracture, while the final 2 images demonstrate performance of a left total hip arthroplasty with the femoral and acetabular components being approximated with one another on these limited images. IMPRESSION: Intraoperative images during left total hip arthroplasty. Electronically Signed   By: Sebastian Ache M.D.   On: 12/14/2019 15:33   DG Hip Port Unilat With Pelvis 1V Left  Result Date: 12/14/2019 CLINICAL DATA:  Status post left hip replacement. EXAM: DG HIP (WITH OR WITHOUT PELVIS) 1V PORT LEFT COMPARISON:  Fluoroscopic images of same day. FINDINGS: The left  femoral and acetabular components are well situated. Expected postoperative changes are seen in the surrounding soft tissues. IMPRESSION: Status post left total hip arthroplasty. Electronically Signed   By: Lupita Raider M.D.   On: 12/14/2019 16:54   DG HIP OPERATIVE UNILAT W OR W/O PELVIS LEFT  Result Date: 12/14/2019 CLINICAL DATA:  Left hip replacement. EXAM: OPERATIVE LEFT HIP (WITH PELVIS IF PERFORMED) 4 VIEWS TECHNIQUE: Fluoroscopic spot image(s) were submitted for interpretation post-operatively. COMPARISON:  12/02/2019 FINDINGS: 4 intraoperative fluoroscopic images are provided. The first 2 images again demonstrate a basicervical left femoral neck fracture, while the final 2 images demonstrate performance of a left total hip arthroplasty with the femoral and acetabular components being approximated with one another on these limited images. IMPRESSION: Intraoperative images during left total hip arthroplasty. Electronically Signed   By: Sebastian Ache M.D.   On: 12/14/2019 15:33    Assessment/Plan: 1 Day Post-Op Procedure(s) (LRB): LEFT TOTAL HIP ARTHROPLASTY-DIRECT ANTERIOR (Left) Up with therapy, discharge later today.   Ashley [name] 12/15/2019, 7:35 AM
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
## 2019-12-15 NOTE — Discharge Instructions (Signed)
Dental Antibiotics:  In most cases prophylactic antibiotics for Dental procdeures after total joint surgery are not necessary.  Exceptions are as follows:  1. History of prior total joint infection  2. Severely immunocompromised (Organ Transplant, cancer chemotherapy, Rheumatoid biologic meds such as [place])  3. Poorly controlled diabetes (A1C &gt; 8.0, blood glucose over 200)  If you have one of these conditions, contact your surgeon for an antibiotic prescription, prior to your dental procedure. INSTRUCTIONS AFTER JOINT REPLACEMENT   o Remove items at home which could result in a fall. This includes throw rugs or furniture in walking pathways o ICE to the affected joint every three hours while awake for 30 minutes at a time, for at least the first 3-5 days, and then as needed for pain and swelling.  Continue to use ice for pain and swelling. You may notice swelling that will progress down to the foot and ankle.  This is normal after surgery.  Elevate your leg when you are not up walking on it.   o Continue to use the breathing machine you got in the hospital (incentive spirometer) which will help keep your temperature down.  It is common for your temperature to cycle up and down following surgery, especially at night when you are not up moving around and exerting yourself.  The breathing machine keeps your lungs expanded and your temperature down.   DIET:  As you were doing prior to hospitalization, we recommend a well-balanced diet.  DRESSING / WOUND CARE / SHOWERING  Keep the surgical dressing until follow up.  The dressing is water proof, so you can shower without any extra covering.  IF THE DRESSING FALLS OFF or the wound gets wet inside, change the dressing with sterile gauze.  Please use good hand washing techniques before changing the dressing.  Do not use any lotions or creams on the incision until instructed by your surgeon.    ACTIVITY  o Increase activity slowly as  tolerated, but follow the weight bearing instructions below.   o No driving for 6 weeks or until further direction given by your physician.  You cannot drive while taking narcotics.  o No lifting or carrying greater than 10 lbs. until further directed by your surgeon. o Avoid periods of inactivity such as sitting longer than an hour when not asleep. This helps prevent blood clots.  o You may return to work once you are authorized by your doctor.     WEIGHT BEARING   Other:  touch down weight bearing only for 6 wks.    EXERCISES  Results after joint replacement surgery are often greatly improved when you follow the exercise, range of motion and muscle strengthening exercises prescribed by your doctor. Safety measures are also important to protect the joint from further injury. Any time any of these exercises cause you to have increased pain or swelling, decrease what you are doing until you are comfortable again and then slowly increase them. If you have problems or questions, call your caregiver or physical therapist for advice.   Rehabilitation is important following a joint replacement. After just a few days of immobilization, the muscles of the leg can become weakened and shrink (atrophy).  These exercises are designed to build up the tone and strength of the thigh and leg muscles and to improve motion. Often times heat used for twenty to thirty minutes before working out will loosen up your tissues and help with improving the range of motion but do not use  heat for the first two weeks following surgery (sometimes heat can increase post-operative swelling).   These exercises can be done on a training (exercise) mat, on the floor, on a table or on a bed. Use whatever works the best and is most comfortable for you.    Use music or television while you are exercising so that the exercises are a pleasant break in your day. This will make your life better with the exercises acting as a break in your  routine that you can look forward to.   Perform all exercises about fifteen times, three times per day or as directed.  You should exercise both the operative leg and the other leg as well.  Exercises include:   . Quad Sets - Tighten up the muscle on the front of the thigh (Quad) and hold for 5-10 seconds.   . Straight Leg Raises - With your knee straight (if you were given a brace, keep it on), lift the leg to 60 degrees, hold for 3 seconds, and slowly lower the leg.  Perform this exercise against resistance later as your leg gets stronger.  . Leg Slides: Lying on your back, slowly slide your foot toward your buttocks, bending your knee up off the floor (only go as far as is comfortable). Then slowly slide your foot back down until your leg is flat on the floor again.  Lawanna Kobus Wings: Lying on your back spread your legs to the side as far apart as you can without causing discomfort.  . Hamstring Strength:  Lying on your back, push your heel against the floor with your leg straight by tightening up the muscles of your buttocks.  Repeat, but this time bend your knee to a comfortable angle, and push your heel against the floor.  You may put a pillow under the heel to make it more comfortable if necessary.   A rehabilitation program following joint replacement surgery can speed recovery and prevent re-injury in the future due to weakened muscles. Contact your doctor or a physical therapist for more information on knee rehabilitation.    CONSTIPATION  Constipation is defined medically as fewer than three stools per week and severe constipation as less than one stool per week.  Even if you have a regular bowel pattern at home, your normal regimen is likely to be disrupted due to multiple reasons following surgery.  Combination of anesthesia, postoperative narcotics, change in appetite and fluid intake all can affect your bowels.   YOU MUST use at least one of the following options; they are listed in  order of increasing strength to get the job done.  They are all available over the counter, and you may need to use some, POSSIBLY even all of these options:    Drink plenty of fluids (prune juice may be helpful) and high fiber foods Colace 100 mg by mouth twice a day  Senokot for constipation as directed and as needed Dulcolax (bisacodyl), take with full glass of water  Miralax (polyethylene glycol) once or twice a day as needed.  If you have tried all these things and are unable to have a bowel movement in the first 3-4 days after surgery call either your surgeon or your primary doctor.    If you experience loose stools or diarrhea, hold the medications until you stool forms back up.  If your symptoms do not get better within 1 week or if they get worse, check with your doctor.  If you experience "the  worst abdominal pain ever" or develop nausea or vomiting, please contact the office immediately for further recommendations for treatment.   ITCHING:  If you experience itching with your medications, try taking only a single pain pill, or even half a pain pill at a time.  You can also use Benadryl over the counter for itching or also to help with sleep.   TED HOSE STOCKINGS:  Use stockings on both legs until for at least 2 weeks or as directed by physician office. They may be removed at night for sleeping.  MEDICATIONS:  See your medication summary on the "After Visit Summary" that nursing will review with you.  You may have some home medications which will be placed on hold until you complete the course of blood thinner medication.  It is important for you to complete the blood thinner medication as prescribed.  PRECAUTIONS:  If you experience chest pain or shortness of breath - call 911 immediately for transfer to the hospital emergency department.   If you develop a fever greater that 101 F, purulent drainage from wound, increased redness or drainage from wound, foul odor from the  wound/dressing, or calf pain - CONTACT YOUR SURGEON.                                                   FOLLOW-UP APPOINTMENTS:  If you do not already have a post-op appointment, please call the office for an appointment to be seen by your surgeon.  Guidelines for how soon to be seen are listed in your "After Visit Summary", but are typically between 1-4 weeks after surgery.  OTHER INSTRUCTIONS:   Knee Replacement:  Do not place pillow under knee, focus on keeping the knee straight while resting. CPM instructions: 0-90 degrees, 2 hours in the morning, 2 hours in the afternoon, and 2 hours in the evening. Place foam block, curve side up under heel at all times except when in CPM or when walking.  DO NOT modify, tear, cut, or change the foam block in any way.   DENTAL ANTIBIOTICS:  In most cases prophylactic antibiotics for Dental procdeures after total joint surgery are not necessary.  Exceptions are as follows:  1. History of prior total joint infection  2. Severely immunocompromised (Organ Transplant, cancer chemotherapy, Rheumatoid biologic meds such as [place])  3. Poorly controlled diabetes (A1C &gt; 8.0, blood glucose over 200)  If you have one of these conditions, contact your surgeon for an antibiotic prescription, prior to your dental procedure.   MAKE SURE YOU:  . Understand these instructions.  . Get help right away if you are not doing well or get worse.    Thank you for letting us be a part of your medical care team.  It is a privilege we respect greatly.  We hope these instructions will help you stay on track for a fast and full recovery!

## 2019-12-15 NOTE — Plan of Care (Signed)
Patient alert and oriented, mae's well, voiding adequate amount of urine, swallowing without difficulty, c/o mild pain at time of discharge. Patient discharged home with family. Script and discharged instructions given to patient. Patient and family stated understanding of instructions given. Rolling walker given to patient for home use. Patient has an appointment with Dr. Ophelia Charter

## 2019-12-16 ENCOUNTER — Encounter (HOSPITAL_COMMUNITY): Payer: Self-pay | Admitting: Orthopaedic Surgery

## 2019-12-16 ENCOUNTER — Telehealth: Payer: Self-pay

## 2019-12-16 NOTE — Telephone Encounter (Signed)
Transition Care Management Follow-up Telephone Call Date of discharge and from where: 12/15/2019 [facility]   Called pt unable to reach/ Left voice message to call back. Name and phone nr provided.

## 2019-12-17 ENCOUNTER — Telehealth: Payer: Self-pay

## 2019-12-17 NOTE — Telephone Encounter (Signed)
Transition Care Management Follow-up Telephone Call Date of discharge and from where: 12/15/2019 [facility]  How have you been since you were released from the hospital? Feeling ok Any questions or concerns? Pt stated DR Ophelia Charter did not change her dressing the day of discharge, concerned about this. Reinforced discharge dressing/wound care/showoering as per as per hospital MD instructions.[place]  advised pt to call the office of Dr Ophelia Charter and f /u if needed.  Items Reviewed:Did the pt receive andided?  understand the discharge instructions provYES Medications obtained and verified? YES  Any new allergies since your discharge? NONE Dietary orders reviewed? Yes  Do you have support at home?Girlfriend   Functional Questionnaire: (I = Independent and D = Dependent) ADLs: I with girlfriend assistance and rolling walker   Follow up appointments reviewed:  PCP Hospital f/u appt confirmed?  Scheduled to see NP Zelda 01/05/2020. Requested for a tele appt/   Specialist Hospital f/u appt confirmed? Ortho on 12/29/2019 Are transportation arrangements needed? NO  If their condition worsens, /is the pt aware to call PCP or go to the Emergency Dept.?  Pt is aware if condition is worsening or start experiencing any fevers, foul smell from incision site, diff breathing, SOB, dizziness, chest pain, extreme fatigue,  Persistent nausea and vomiting, bleeding , severe uncontrolled pain, or visual disturbances to return to ED  Was the patient provided with contact information for the PCP's office or ED? YES given.  Was to pt encouraged to call back with questions or concerns?YES name and contact information . OTHER  Pt is referred for PT services .

## 2019-12-21 ENCOUNTER — Other Ambulatory Visit: Payer: Self-pay | Admitting: Orthopaedic Surgery

## 2019-12-21 ENCOUNTER — Telehealth: Payer: Self-pay | Admitting: Orthopaedic Surgery

## 2019-12-21 DIAGNOSIS — I10 Essential (primary) hypertension: Secondary | ICD-10-CM

## 2019-12-21 MED ORDER — OXYCODONE-ACETAMINOPHEN 5-325 MG PO TABS: 1.0000 | ORAL_TABLET | Freq: Four times a day (QID) | ORAL | 0 refills | Status: AC | PRN

## 2019-12-21 MED ORDER — METHOCARBAMOL 500 MG PO TABS: 500.0000 mg | ORAL_TABLET | Freq: Four times a day (QID) | ORAL | 0 refills | Status: DC

## 2019-12-21 NOTE — Telephone Encounter (Signed)
Patient called.   She is requesting a refill on her pain and muscle relaxing medications  Call back: [phone]

## 2019-12-21 NOTE — Telephone Encounter (Signed)
Please advise 

## 2019-12-21 NOTE — Telephone Encounter (Signed)
Done. Woodward Ku her and let her know.

## 2019-12-21 NOTE — Discharge Summary (Signed)
Patient ID: Ashley [name] MRN: [id number] DOB/AGE: 02-19-1966 54 y.o.  Admit date: 12/14/2019 Discharge date: 12/15/2019 Admission Diagnoses:  Active Problems:   Arthritis of left hip   Discharge Diagnoses:  Active Problems:   Arthritis of left hip  status post Procedure(s): LEFT TOTAL HIP ARTHROPLASTY-DIRECT ANTERIOR  Past Medical History:  Diagnosis Date  . Anxiety   . Chronic left-sided back pain   . Depression   . GERD (gastroesophageal reflux disease)   . Hypertension     Surgeries: Procedure(s): LEFT TOTAL HIP ARTHROPLASTY-DIRECT ANTERIOR on 12/14/2019   Consultants:   Discharged Condition: Improved  Hospital Course: Ashley [name] is an 54 y.o. female who was admitted 12/14/2019 for operative treatment of left hip djd. Patient failed conservative treatments (please see the history and physical for the specifics) and had severe unremitting pain that affects sleep, daily activities and work/hobbies. After pre-op clearance, the patient was taken to the operating room on 12/14/2019 and underwent  Procedure(s): LEFT TOTAL HIP ARTHROPLASTY-DIRECT ANTERIOR.    Patient was given perioperative antibiotics:  Anti-infectives (From admission, onward)   Start     Dose/Rate Route Frequency Ordered Stop   12/14/19 1231  ceFAZolin (ANCEF) 2-4 GM/100ML-% IVPB       Note to Pharmacy: Gustavo Lah   : cabinet override      12/14/19 1231 12/15/19 0044       Patient was given sequential compression devices and early ambulation to prevent DVT.   Patient benefited maximally from hospital stay and there were no complications. At the time of discharge, the patient was urinating/moving their bowels without difficulty, tolerating a regular diet, pain is controlled with oral pain medications and they have been cleared by PT/OT.   Recent vital signs: No data found.   Recent laboratory studies: No results for input(s): WBC, HGB, HCT, PLT, NA, K, CL, CO2, BUN, CREATININE, GLUCOSE, INR,  CALCIUM in the last 72 hours.  Invalid input(s): PT, 2   Discharge Medications:   Allergies as of 12/15/2019   No Known Allergies     Medication List    TAKE these medications   amLODipine 5 MG tablet Commonly known as: NORVASC Take 1 tablet (5 mg total) by mouth daily.   aspirin 325 MG tablet Commonly known as: Bayer Aspirin Take 1 tablet (325 mg total) by mouth daily. Take one daily for one month to decrease blood clot risk   busPIRone 30 MG tablet Commonly known as: BUSPAR Take 30 mg by mouth 2 (two) times daily.   chlorhexidine 0.12 % solution Commonly known as: Peridex Use as directed 15 mLs in the mouth or throat 2 (two) times daily. PLEASE MAIL What changed: when to take this   hydrochlorothiazide 25 MG tablet Commonly known as: HYDRODIURIL Take 1 tablet (25 mg total) by mouth daily.   magnesium oxide 400 MG tablet Commonly known as: MAG-OX Take 400 mg by mouth daily.   omeprazole 40 MG capsule Commonly known as: PRILOSEC Take 1 capsule (40 mg total) by mouth 2 (two) times daily. Take 30-60 minutes before breakfast and dinner.   Potassium 99 MG Tabs Take 99 mg by mouth daily.   traZODone 100 MG tablet Commonly known as: DESYREL Take 1 tablet (100 mg total) by mouth at bedtime.   vitamin C 1000 MG tablet Take 1,000 mg by mouth daily.       Diagnostic Studies: DG C-Arm 1-60 Min  Result Date: 12/14/2019 CLINICAL DATA:  Left hip replacement. EXAM: OPERATIVE LEFT  HIP (WITH PELVIS IF PERFORMED) 4 VIEWS TECHNIQUE: Fluoroscopic spot image(s) were submitted for interpretation post-operatively. COMPARISON:  12/02/2019 FINDINGS: 4 intraoperative fluoroscopic images are provided. The first 2 images again demonstrate a basicervical left femoral neck fracture, while the final 2 images demonstrate performance of a left total hip arthroplasty with the femoral and acetabular components being approximated with one another on these limited images. IMPRESSION:  Intraoperative images during left total hip arthroplasty. Electronically Signed   By: Sebastian Ache M.D.   On: 12/14/2019 15:33   DG Hip Port Unilat With Pelvis 1V Left  Result Date: 12/14/2019 CLINICAL DATA:  Status post left hip replacement. EXAM: DG HIP (WITH OR WITHOUT PELVIS) 1V PORT LEFT COMPARISON:  Fluoroscopic images of same day. FINDINGS: The left femoral and acetabular components are well situated. Expected postoperative changes are seen in the surrounding soft tissues. IMPRESSION: Status post left total hip arthroplasty. Electronically Signed   By: Lupita Raider M.D.   On: 12/14/2019 16:54   DG HIP OPERATIVE UNILAT W OR W/O PELVIS LEFT  Result Date: 12/14/2019 CLINICAL DATA:  Left hip replacement. EXAM: OPERATIVE LEFT HIP (WITH PELVIS IF PERFORMED) 4 VIEWS TECHNIQUE: Fluoroscopic spot image(s) were submitted for interpretation post-operatively. COMPARISON:  12/02/2019 FINDINGS: 4 intraoperative fluoroscopic images are provided. The first 2 images again demonstrate a basicervical left femoral neck fracture, while the final 2 images demonstrate performance of a left total hip arthroplasty with the femoral and acetabular components being approximated with one another on these limited images. IMPRESSION: Intraoperative images during left total hip arthroplasty. Electronically Signed   By: Sebastian Ache M.D.   On: 12/14/2019 15:33   XR HIP UNILAT W OR W/O PELVIS 2-3 VIEWS LEFT  Result Date: 12/02/2019 Standing AP both hips frog-leg lateral left hip shows changes suggestive of osteopenia.  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  Femoral head is located in the acetabulum. 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  XR KNEE 3 VIEW LEFT  Result Date: 12/02/2019 Standing AP both knees lateral left knee obtained and reviewed this shows trace medial narrowing no significant osteophytes negative for acute fracture or significant degenerative  changes. Impression: Normal left knee x-rays.      Follow-up Information    Eldred Manges, MD Follow up in 2 week(s).   Specialty: Orthopedic Surgery Contact information: [address] [place] [address] [phone]               Discharge Plan:  discharge to home  Disposition:     Signed: Zonia Kief  12/21/2019, 2:14 PM

## 2019-12-23 ENCOUNTER — Telehealth: Payer: Self-pay | Admitting: *Deleted

## 2019-12-23 NOTE — Telephone Encounter (Signed)
Hi Kristen, Thanks. Yes she definitely need an EGD and would try to coordinate at the same time as her colonoscopy. If she has recovered from her hip, not on anticoagulants, and no other major changes to her health, I think okay to directly book both. Thanks

## 2019-12-23 NOTE — Telephone Encounter (Signed)
Left message for pt to call office back

## 2019-12-23 NOTE — Telephone Encounter (Signed)
Dr. Adela Lank,  This pt is coming in for a screening colonoscopy on 01-19-20- her PV is 01-05-20. A couple of things I'd like you to know- she just had left total hip arthroplasty on 12-14-19.  Also, you saw for an inpatient EGD on 03-15-19 for a GI bleed.  She saw Victorino Dike on 06-10-19 for a follow up OV and a repeat EGD was recommended, which was never done.  How would you like to proceed?  Per Dr. Myrtie Neither, we are to check with the MD if a patient had an OV and a procedure scheduled- if that procedure was cancelled by the pt, and OV was greater than 90 days ago, we are to check with the MD on how to proceed.  Also, given her recent surgery, I wanted to check with you as well.  Please advise. Thanks, [employer]

## 2019-12-24 ENCOUNTER — Encounter (HOSPITAL_COMMUNITY): Payer: Self-pay | Admitting: Orthopaedic Surgery

## 2019-12-24 NOTE — Telephone Encounter (Signed)
Called patient, no answer, left a message for the patient to call me back.  

## 2019-12-25 ENCOUNTER — Telehealth: Payer: Self-pay | Admitting: Orthopaedic Surgery

## 2019-12-25 ENCOUNTER — Telehealth: Payer: Self-pay

## 2019-12-25 MED ORDER — TRAMADOL HCL 50 MG PO TABS: 50.0000 mg | ORAL_TABLET | Freq: Three times a day (TID) | ORAL | 0 refills | Status: DC | PRN

## 2019-12-25 NOTE — Telephone Encounter (Signed)
I left voicemail for patient. Requested return call to let us know which pharmacy she would like tramadol called to. Multiple listed in chart.

## 2019-12-25 NOTE — Telephone Encounter (Signed)
Called to pharmacy 

## 2019-12-25 NOTE — Telephone Encounter (Signed)
Patient called back. Pharmacy is [facility] in [place]. Returned called to [place] for pharmacy location to send Tramadol. Patient phone number is [phone].

## 2019-12-25 NOTE — Telephone Encounter (Signed)
Please advise 

## 2019-12-25 NOTE — Telephone Encounter (Signed)
Patient called needing Rx refilled (Oxycodone) The number to contact patient is [phone]

## 2019-12-25 NOTE — Telephone Encounter (Signed)
I called her, been thru 80 percocet since surgery , or almost she says she will run out tomorrow.  Call in tramadol # 40  one po tid prn pain thanks. .   ( she also got 60 tramadol 5/11 thru [place] )

## 2019-12-25 NOTE — Telephone Encounter (Signed)
Called to [facility] in [place] per patient request.

## 2019-12-25 NOTE — Telephone Encounter (Signed)
She remains TDWB as she was instructed. She will get xrays on ROV.

## 2019-12-25 NOTE — Telephone Encounter (Signed)
Patient called in wanting to know is she able to walk now or need to stay in bed.

## 2019-12-25 NOTE — Telephone Encounter (Signed)
I left voicemail advising. ?

## 2019-12-25 NOTE — Telephone Encounter (Signed)
Patient called me back. I explained Dr.Armbruster's recommendations. Patient states she unable to have the procedures at this time. She is in a lot of pain and unable to lay on her left hip. Patient will call us back when she is ready to reschedule the EGD/Colon.  Procedure & PV cancelled at this time- pt is aware.   Recall ECL placed in Epic just as a reminder to [place].

## 2019-12-29 ENCOUNTER — Inpatient Hospital Stay: Payer: Medicaid Other | Admitting: Orthopaedic Surgery

## 2019-12-29 ENCOUNTER — Telehealth: Payer: Self-pay | Admitting: Orthopaedic Surgery

## 2019-12-29 NOTE — Telephone Encounter (Signed)
Please advise 

## 2019-12-29 NOTE — Telephone Encounter (Signed)
noted 

## 2019-12-29 NOTE — Telephone Encounter (Signed)
Already took 80 tablets in 14 days. Time to get off narcotics. I tried to call her to discuss , she did not pick up.

## 2019-12-29 NOTE — Telephone Encounter (Signed)
Pt states she was previously prescribed tramadol and didn't help so she didn't pick up the one Dr.Yates sent in; pt would like to see if she can be sent something else?    [phone]

## 2019-12-30 ENCOUNTER — Telehealth: Payer: Self-pay | Admitting: Orthopaedic Surgery

## 2019-12-30 ENCOUNTER — Inpatient Hospital Stay: Payer: Medicaid Other | Admitting: Orthopaedic Surgery

## 2019-12-30 NOTE — Telephone Encounter (Signed)
Patient called needing to R/S her appointment for post op. Patient want to come in next week.  Patient said she did not have a ride today. The number to contact patient is [phone] or [phone]

## 2019-12-30 NOTE — Telephone Encounter (Signed)
Could you please call and work patient in for appt somewhere next week? She is post op and will need to be seen. Thanks.

## 2020-01-05 ENCOUNTER — Encounter: Payer: Self-pay | Admitting: Orthopaedic Surgery

## 2020-01-05 ENCOUNTER — Ambulatory Visit: Payer: Self-pay | Attending: Nurse Practitioner | Admitting: Nurse Practitioner

## 2020-01-05 ENCOUNTER — Other Ambulatory Visit: Payer: Self-pay

## 2020-01-05 ENCOUNTER — Ambulatory Visit (INDEPENDENT_AMBULATORY_CARE_PROVIDER_SITE_OTHER): Payer: Self-pay | Admitting: Orthopaedic Surgery

## 2020-01-05 ENCOUNTER — Ambulatory Visit (INDEPENDENT_AMBULATORY_CARE_PROVIDER_SITE_OTHER): Payer: Self-pay

## 2020-01-05 VITALS — Ht 64.0 in | Wt 160.0 lb

## 2020-01-05 DIAGNOSIS — Z96642 Presence of left artificial hip joint: Secondary | ICD-10-CM

## 2020-01-05 MED ORDER — METHOCARBAMOL 500 MG PO TABS: 500.0000 mg | ORAL_TABLET | Freq: Four times a day (QID) | ORAL | 0 refills | Status: DC

## 2020-01-05 MED ORDER — TRAMADOL HCL 50 MG PO TABS: 50.0000 mg | ORAL_TABLET | Freq: Three times a day (TID) | ORAL | 0 refills | Status: DC | PRN

## 2020-01-05 MED FILL — METHOCARBAMOL 500 MG TABS: 500 | 10 days supply | Qty: 40 | Fill #0

## 2020-01-05 NOTE — Progress Notes (Signed)
   Post-Op Visit Note   Patient: Ashley [name]           Date of Birth: [birth year]           MRN: [id number] Visit Date: 01/05/2020 PCP: Claiborne Rigg, NP   Assessment & Plan: Return in 3 weeks AP pelvis single x-rays show both hips on return.  Staples are harvested today.  Chief Complaint:  Chief Complaint  Patient presents with  . Left Hip - Routine Post Op    12/14/2019 Left THA   Visit Diagnoses:  1. Hx of total hip arthroplasty, left     Plan: Return 3 weeks single x-ray on return as above.  Again we discussed taking it easy until she has been 6 weeks postop.  She has 3 more weeks.  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  Follow-Up Instructions: Return in about 3 weeks (around 01/26/2020).   Orders:  Orders Placed This Encounter  Procedures  . XR HIP UNILAT W OR W/O PELVIS 2-3 VIEWS LEFT   No orders of the defined types were placed in this encounter.   Imaging: No results found.  PMFS History: Patient Active Problem List   Diagnosis Date Noted  . Arthritis of left hip 12/14/2019  . 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 Symptomatic anemia 03/18/2019  . Hypotension 03/18/2019  . Pyloric ulcer 03/18/2019  . Hypokalemia 03/18/2019  . Hypomagnesemia 03/18/2019  . Upper GI bleed 03/15/2019  . Acute gastroenteritis 01/01/2019  . Intractable nausea and vomiting 12/31/2018  . AKI (acute kidney injury) (HCC) 12/31/2018  . GERD (gastroesophageal reflux disease) 12/31/2018   Past Medical History:  Diagnosis Date  . Anxiety   . Chronic left-sided back pain   . Depression   . GERD (gastroesophageal reflux disease)   . Hypertension     Family History  Problem Relation Age of Onset  . Stomach cancer Mother   . Colon cancer Father   . Stomach cancer Sister   . Pancreatic cancer Neg Hx   .  Esophageal cancer Neg Hx     Past Surgical History:  Procedure Laterality Date  . ESOPHAGOGASTRODUODENOSCOPY (EGD) WITH PROPOFOL N/A 03/15/2019   Procedure: ESOPHAGOGASTRODUODENOSCOPY (EGD) WITH PROPOFOL;  Surgeon: Benancio Deeds, MD;  Location: [facility];  Service: Gastroenterology;  Laterality: N/A;  . IR ANGIOGRAM SELECTIVE EACH ADDITIONAL VESSEL  03/15/2019  . IR ANGIOGRAM SELECTIVE EACH ADDITIONAL VESSEL  03/15/2019  . IR ANGIOGRAM VISCERAL SELECTIVE  03/15/2019  . IR EMBO ART  VEN HEMORR LYMPH EXTRAV  INC GUIDE ROADMAPPING  03/15/2019  . IR US GUIDE VASC ACCESS RIGHT  03/15/2019  . TOTAL HIP ARTHROPLASTY Left 12/14/2019   Procedure: LEFT TOTAL HIP ARTHROPLASTY-DIRECT ANTERIOR;  Surgeon: Eldred Manges, MD;  Location: [facility];  Service: Orthopedics;  Laterality: Left;  . TUBAL LIGATION     Social History   Occupational History  . Not on file  Tobacco Use  . Smoking status: Current Every Day Smoker    Packs/day: 1.00    Years: 26.00    Pack years: 26.00    Types: Cigarettes  . Smokeless tobacco: Never Used  Vaping Use  . Vaping Use: Never used  Substance and Sexual Activity  . Alcohol use: No  . Drug use: No  . Sexual activity: Yes

## 2020-01-05 NOTE — Addendum Note (Signed)
Addended by: Rogers Seeds on: 01/05/2020 02:21 PM   Modules accepted: Orders

## 2020-01-06 MED FILL — traMADol HCL 50 MG TABS: 50 | 13 days supply | Qty: 40 | Fill #0

## 2020-01-07 MED FILL — AMLODIPINE BESYLATE 5 MG TA: 5 | 30 days supply | Qty: 30 | Fill #2

## 2020-01-07 MED FILL — TRAZODONE HCL 100 MG TABS: 100 | 30 days supply | Qty: 30 | Fill #2

## 2020-01-07 MED FILL — OMEPRAZOLE DR 40 MG CAPSULE: 40 | 30 days supply | Qty: 60 | Fill #2

## 2020-01-07 MED FILL — HYDROCHLOROTHIAZIDE 25 MG T: 25 | 30 days supply | Qty: 30 | Fill #2

## 2020-01-07 MED FILL — busPIRone HCL 30 MG TABS: 30 | 30 days supply | Qty: 60 | Fill #2

## 2020-01-19 ENCOUNTER — Encounter: Payer: Medicaid Other | Admitting: Gastroenterology

## 2020-02-02 ENCOUNTER — Ambulatory Visit: Payer: Medicaid Other | Admitting: Orthopaedic Surgery

## 2020-02-04 MED FILL — busPIRone HCL 30 MG TABS: 30 | 30 days supply | Qty: 60 | Fill #3

## 2020-02-04 MED FILL — TRAZODONE HCL 100 MG TABS: 100 | 30 days supply | Qty: 30 | Fill #3

## 2020-02-23 ENCOUNTER — Ambulatory Visit: Payer: Medicaid Other | Admitting: Orthopaedic Surgery

## 2020-02-24 ENCOUNTER — Ambulatory Visit (INDEPENDENT_AMBULATORY_CARE_PROVIDER_SITE_OTHER): Payer: Self-pay | Admitting: Surgery

## 2020-02-24 ENCOUNTER — Ambulatory Visit: Payer: Self-pay

## 2020-02-24 ENCOUNTER — Encounter: Payer: Self-pay | Admitting: Surgery

## 2020-02-24 VITALS — BP 130/77 | HR 66 | Ht 64.0 in | Wt 160.0 lb

## 2020-02-24 DIAGNOSIS — Z96642 Presence of left artificial hip joint: Secondary | ICD-10-CM

## 2020-02-24 MED ORDER — METHOCARBAMOL 500 MG PO TABS
500.0000 mg | ORAL_TABLET | Freq: Three times a day (TID) | ORAL | 0 refills | Status: DC | PRN
Start: 2020-02-24 — End: 2023-06-07

## 2020-02-24 NOTE — Progress Notes (Signed)
Office Visit Note   Patient: Ashley [name]           Date of Birth: 1966/03/10           MRN: [id number] Visit Date: 02/24/2020              Requested by: Claiborne Rigg, NP [address] [place],  [place] [address] PCP: Claiborne Rigg, NP   Assessment & Plan: Visit Diagnoses:  1. Hx of total hip arthroplasty, left   Calcar fracture requiring cable  Plan: With the groin pain patient is describing advised her to be touchdown weightbearing with crutches.  I stressed to her the importance of being compliant with instructions.  will get Dr. Ophelia Charter to review the x-ray.  Follow-up in 2 weeks for recheck.  Follow-Up Instructions: Return in about 2 weeks (around 03/09/2020) for WITH DR YATES RECHECK LEFT HIP PER Laquida Cotrell.   Orders:  Orders Placed This Encounter  Procedures  . XR HIP UNILAT W OR W/O PELVIS 2-3 VIEWS LEFT   No orders of the defined types were placed in this encounter.     Procedures: No procedures performed   Clinical Data: No additional findings.   Subjective: Chief Complaint  Patient presents with  . Left Hip - Routine Post Op    HPI Patient returns.  Status post left total hip replacement December 14, 2019 that required cable for calcar fracture.  Patient missed scheduled appointments August 3 and August 24.  Against advice by Dr. Ophelia Charter patient started weightbearing as tolerated with a cane on August 3.  Since that time she has had increased pain in the left groin.  She just started back using the crutches.   Objective: Vital Signs: BP 130/77   Pulse 66   Ht 5\' 4"  (1.626 m)   Wt 160 lb (72.6 kg)   BMI 27.46 kg/m   Physical Exam Pleasant female alert and oriented in no acute distress.  She does have left groin pain with about 10 to 15 degrees internal/external rotation.  Negative straight leg raise. Ortho Exam  Specialty Comments:  No specialty comments available.  Imaging: No results found.   PMFS History: Patient Active Problem List    Diagnosis Date Noted  . Hx of total hip arthroplasty, left 01/05/2020  . Arthritis of left hip 12/14/2019  . 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 Symptomatic anemia 03/18/2019  . Hypotension 03/18/2019  . Pyloric ulcer 03/18/2019  . Hypokalemia 03/18/2019  . Hypomagnesemia 03/18/2019  . Upper GI bleed 03/15/2019  . Acute gastroenteritis 01/01/2019  . Intractable nausea and vomiting 12/31/2018  . AKI (acute kidney injury) (HCC) 12/31/2018  . GERD (gastroesophageal reflux disease) 12/31/2018   Past Medical History:  Diagnosis Date  . Anxiety   . Chronic left-sided back pain   . Depression   . GERD (gastroesophageal reflux disease)   . Hypertension     Family History  Problem Relation Age of Onset  . Stomach cancer Mother   . Colon cancer Father   . Stomach cancer Sister   . Pancreatic cancer Neg Hx   . Esophageal cancer Neg Hx     Past Surgical History:  Procedure Laterality Date  . ESOPHAGOGASTRODUODENOSCOPY (EGD) WITH PROPOFOL N/A 03/15/2019   Procedure: ESOPHAGOGASTRODUODENOSCOPY (EGD) WITH PROPOFOL;  Surgeon: 03/17/2019, MD;  Location: [facility];  Service: Gastroenterology;  Laterality: N/A;  . IR ANGIOGRAM SELECTIVE EACH ADDITIONAL VESSEL  03/15/2019  . IR ANGIOGRAM  SELECTIVE EACH ADDITIONAL VESSEL  03/15/2019  . IR ANGIOGRAM VISCERAL SELECTIVE  03/15/2019  . IR EMBO ART  VEN HEMORR LYMPH EXTRAV  INC GUIDE ROADMAPPING  03/15/2019  . IR US GUIDE VASC ACCESS RIGHT  03/15/2019  . TOTAL HIP ARTHROPLASTY Left 12/14/2019   Procedure: LEFT TOTAL HIP ARTHROPLASTY-DIRECT ANTERIOR;  Surgeon: Eldred Manges, MD;  Location: [facility];  Service: Orthopedics;  Laterality: Left;  . TUBAL LIGATION     Social History   Occupational History  . Not on file  Tobacco Use  . Smoking status: Current Every Day Smoker    Packs/day: 1.00    Years: 26.00    Pack years: 26.00    Types: Cigarettes  . Smokeless tobacco: Never Used  Vaping Use  .  Vaping Use: Never used  Substance and Sexual Activity  . Alcohol use: No  . Drug use: No  . Sexual activity: Yes

## 2020-02-25 ENCOUNTER — Telehealth: Payer: Self-pay | Admitting: Radiology

## 2020-02-25 NOTE — Telephone Encounter (Signed)
Fayrene Fearing saw patient in clinic yesterday. She is post op and has missed two previous appointments.  Patient has been weightbearing with a cane.  Fayrene Fearing had patient resume touch down weight bearing with crutches until Dr. Ophelia Charter reviewed images and his note to advise on next step. Per Dr. Ophelia Charter, xrays look good, patient may be weightbearing as tolerated with cane as she has been doing.   I tried to call patient to advise but phone rings busy. I will try again in the morning.

## 2020-02-26 ENCOUNTER — Telehealth: Payer: Self-pay | Admitting: Orthopaedic Surgery

## 2020-02-26 NOTE — Telephone Encounter (Signed)
Patient called asked for a call back.  Patient want to know what the X-rays showed. The number to contact patient is [phone]

## 2020-02-26 NOTE — Telephone Encounter (Signed)
I spoke with patient. She states that the pain is so bad that the only way that she gets relief if TDWB with the crutches like Fayrene Fearing suggested. She wanted to be sure x-rays look ok and questions whether it is a muscle that is bothering her. I advised x-rays look good per Dr. Ophelia Charter. Advised to remain TDWB until follow up in the office in two weeks to see if she can get some relief. Patient asked about muscle relaxer and I advised Fayrene Fearing sent a refill in to her pharmacy on 02/24/2020. She will call if worsening pain.

## 2020-02-26 NOTE — Telephone Encounter (Signed)
I called patient, only rings, no answer.

## 2020-02-26 NOTE — Telephone Encounter (Signed)
I spoke with patient. Duplicate message in chart. 

## 2020-03-09 ENCOUNTER — Ambulatory Visit: Payer: Medicaid Other | Admitting: Orthopaedic Surgery

## 2020-04-06 ENCOUNTER — Ambulatory Visit: Payer: Medicaid Other | Admitting: Orthopaedic Surgery

## 2020-04-13 ENCOUNTER — Ambulatory Visit: Payer: Medicaid Other | Admitting: Orthopaedic Surgery

## 2020-04-27 ENCOUNTER — Ambulatory Visit: Payer: Self-pay | Attending: Nurse Practitioner | Admitting: Nurse Practitioner

## 2020-04-27 ENCOUNTER — Other Ambulatory Visit: Payer: Self-pay

## 2020-04-28 ENCOUNTER — Ambulatory Visit: Payer: Medicaid Other | Admitting: Family Medicine

## 2020-04-28 MED FILL — AMLODIPINE BESYLATE 5 MG TA: 5 | 30 days supply | Qty: 30 | Fill #3

## 2020-04-28 MED FILL — HYDROCHLOROTHIAZIDE 25 MG T: 25 | 30 days supply | Qty: 30 | Fill #3

## 2020-04-28 MED FILL — TRAZODONE HCL 100 MG TABS: 100 | 30 days supply | Qty: 30 | Fill #4

## 2020-04-28 MED FILL — busPIRone HCL 30 MG TABS: 30 | 30 days supply | Qty: 60 | Fill #4

## 2020-04-28 MED FILL — OMEPRAZOLE DR 40 MG CAPSULE: 40 | 30 days supply | Qty: 60 | Fill #3

## 2020-05-18 ENCOUNTER — Ambulatory Visit: Payer: Self-pay | Attending: Family Medicine | Admitting: Family Medicine

## 2020-05-18 ENCOUNTER — Other Ambulatory Visit: Payer: Self-pay

## 2020-05-18 ENCOUNTER — Other Ambulatory Visit: Payer: Self-pay | Admitting: Family Medicine

## 2020-05-18 DIAGNOSIS — F419 Anxiety disorder, unspecified: Secondary | ICD-10-CM

## 2020-05-18 DIAGNOSIS — I10 Essential (primary) hypertension: Secondary | ICD-10-CM

## 2020-05-18 DIAGNOSIS — H65191 Other acute nonsuppurative otitis media, right ear: Secondary | ICD-10-CM

## 2020-05-18 DIAGNOSIS — G4700 Insomnia, unspecified: Secondary | ICD-10-CM

## 2020-05-18 MED ORDER — TRAZODONE HCL 100 MG PO TABS: 100.0000 mg | ORAL_TABLET | Freq: Every day | ORAL | 3 refills | Status: AC

## 2020-05-18 MED ORDER — BUSPIRONE HCL 15 MG PO TABS: 15.0000 mg | ORAL_TABLET | Freq: Two times a day (BID) | ORAL | 3 refills | Status: DC

## 2020-05-18 MED ORDER — AMOXICILLIN-POT CLAVULANATE 875-125 MG PO TABS: 1.0000 | ORAL_TABLET | Freq: Two times a day (BID) | ORAL | 0 refills | Status: DC

## 2020-05-18 NOTE — Progress Notes (Signed)
Needs refills on all medication.  Has been having headaches and abdominal pain.

## 2020-05-18 NOTE — Progress Notes (Signed)
Virtual Visit via Telephone Note  I connected with Ashley [name], on 05/18/2020 at 3:38 PM by telephone [name] to the COVID-19 pandemic and verified that I am speaking with the correct person using two identifiers.   Consent: I discussed the limitations, risks, security and privacy concerns of performing an evaluation and management service by telephone and the availability of in person appointments. I also discussed with the patient that there may be a patient responsible charge related to this service. The patient expressed understanding and agreed to proceed.   Location of Patient: Home  Location of Provider: Clinic   Persons participating in Telemedicine visit: Shakayla Hickox Farrington-CMA Dr. Alvis Lemmings     History of Present Illness: 54 year old female patient of Bertram Denver, FNP with a history of hypertension, anxiety and depression, insomnia who is seen with concerns below.   Complains of R sided headaches which started 2 weeks ago and yesterday she had R ear pain with associated clear ear discharge.  She is unsure if she has decreased hearing.  Denies presence of fever, nausea or vomiting. Had abdominal pain which subsided after she took her PPI. Requests refill of her chronic medications. Past Medical History:  Diagnosis Date  . Anxiety   . Chronic left-sided back pain   . Depression   . GERD (gastroesophageal reflux disease)   . Hypertension    No Known Allergies  Current Outpatient Medications on File Prior to Visit  Medication Sig Dispense Refill  . amLODipine (NORVASC) 5 MG tablet Take 1 tablet (5 mg total) by mouth daily. 90 tablet 3  . Ascorbic Acid (VITAMIN C) 1000 MG tablet Take 1,000 mg by mouth daily.    Marland Kitchen aspirin (BAYER ASPIRIN) 325 MG tablet Take 1 tablet (325 mg total) by mouth daily. Take one daily for one month to decrease blood clot risk    . busPIRone (BUSPAR) 30 MG tablet Take 30 mg by mouth 2 (two) times daily.    . chlorhexidine (PERIDEX)  0.12 % solution Use as directed 15 mLs in the mouth or throat 2 (two) times daily. PLEASE MAIL (Patient taking differently: Use as directed 15 mLs in the mouth or throat daily. PLEASE MAIL) 473 mL 0  . hydrochlorothiazide (HYDRODIURIL) 25 MG tablet Take 1 tablet (25 mg total) by mouth daily. 90 tablet 3  . magnesium oxide (MAG-OX) 400 MG tablet Take 400 mg by mouth daily.    . methocarbamol (ROBAXIN) 500 MG tablet Take 1 tablet (500 mg total) by mouth 4 (four) times daily. 40 tablet 0  . methocarbamol (ROBAXIN) 500 MG tablet Take 1 tablet (500 mg total) by mouth every 8 (eight) hours as needed for muscle spasms. 40 tablet 0  . omeprazole (PRILOSEC) 40 MG capsule Take 1 capsule (40 mg total) by mouth 2 (two) times daily. Take 30-60 minutes before breakfast and dinner. 60 capsule 3  . Potassium 99 MG TABS Take 99 mg by mouth daily.    . traMADol (ULTRAM) 50 MG tablet Take 1 tablet (50 mg total) by mouth 3 (three) times daily as needed. 40 tablet 0  . oxyCODONE-acetaminophen (PERCOCET) 5-325 MG tablet Take 1-2 tablets by mouth every 6 (six) hours as needed for severe pain. Post op pain (Patient not taking: Reported on 01/05/2020) 40 tablet 0  . traZODone (DESYREL) 100 MG tablet Take 1 tablet (100 mg total) by mouth at bedtime. 90 tablet 3   No current facility-administered medications on file prior to visit.    Observations/Objective:  Awake, alert, oriented x3 Not in acute distress  Assessment and Plan: 1. Insomnia, unspecified type Stable - traZODone (DESYREL) 100 MG tablet; Take 1 tablet (100 mg total) by mouth at bedtime.  Dispense: 30 tablet; Refill: 3  2. Essential hypertension Controlled Continue amlodipine  3. Anxiety Stable - busPIRone (BUSPAR) 15 MG tablet; Take 1 tablet (15 mg total) by mouth 2 (two) times daily.  Dispense: 60 tablet; Refill: 3  4. Other non-recurrent acute nonsuppurative otitis media of right ear Could explain her headache Advised to seek an in person  consultation if symptoms persist - amoxicillin-clavulanate (AUGMENTIN) 875-125 MG tablet; Take 1 tablet by mouth 2 (two) times daily.  Dispense: 20 tablet; Refill: 0   Follow Up Instructions: 3 months for chronic disease management   I discussed the assessment and treatment plan with the patient. The patient was provided an opportunity to ask questions and all were answered. The patient agreed with the plan and demonstrated an understanding of the instructions.   The patient was advised to call back or seek an in-person evaluation if the symptoms worsen or if the condition fails to improve as anticipated.     I provided 17 minutes total of non-face-to-face time during this encounter including median intraservice time, reviewing previous notes, investigations, ordering medications, medical decision making, coordinating care and patient verbalized understanding at the end of the visit.     Hoy Register, MD, FAAFP. [facility] [place], [place] [phone]   05/18/2020, 3:38 PM

## 2020-05-19 MED FILL — AMOX-CLAV 875-125 MG TABLET: 875-125 | 10 days supply | Qty: 20 | Fill #0

## 2020-05-24 ENCOUNTER — Ambulatory Visit (INDEPENDENT_AMBULATORY_CARE_PROVIDER_SITE_OTHER): Payer: Self-pay

## 2020-05-24 ENCOUNTER — Encounter: Payer: Self-pay | Admitting: Orthopaedic Surgery

## 2020-05-24 ENCOUNTER — Ambulatory Visit (INDEPENDENT_AMBULATORY_CARE_PROVIDER_SITE_OTHER): Payer: Self-pay | Admitting: Orthopaedic Surgery

## 2020-05-24 ENCOUNTER — Other Ambulatory Visit: Payer: Self-pay

## 2020-05-24 VITALS — Ht 64.0 in | Wt 160.0 lb

## 2020-05-24 DIAGNOSIS — M25552 Pain in left hip: Secondary | ICD-10-CM

## 2020-05-24 DIAGNOSIS — Z96642 Presence of left artificial hip joint: Secondary | ICD-10-CM
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
## 2020-06-01 ENCOUNTER — Other Ambulatory Visit: Payer: Self-pay | Admitting: Nurse Practitioner

## 2020-06-01 DIAGNOSIS — K219 Gastro-esophageal reflux disease without esophagitis: Secondary | ICD-10-CM

## 2020-06-01 MED FILL — TRAZODONE HCL 100 MG TABS: 100 | 30 days supply | Qty: 30 | Fill #5

## 2020-06-01 MED FILL — OMEPRAZOLE DR 40 MG CAPSULE: 40 | 30 days supply | Qty: 60 | Fill #0

## 2020-06-01 MED FILL — busPIRone HCL 30 MG TABS: 30 | 30 days supply | Qty: 60 | Fill #5

## 2020-07-09 ENCOUNTER — Emergency Department (HOSPITAL_COMMUNITY)
Admission: EM | Admit: 2020-07-09 | Discharge: 2020-07-10 | Disposition: A | Discharge status: Home / Self Care | Payer: Medicaid Other | Attending: Emergency Medicine | Admitting: Emergency Medicine

## 2020-07-09 ENCOUNTER — Emergency Department (HOSPITAL_COMMUNITY): Payer: Medicaid Other

## 2020-07-09 ENCOUNTER — Other Ambulatory Visit: Payer: Self-pay | Admitting: Emergency Medicine

## 2020-07-09 ENCOUNTER — Encounter (HOSPITAL_COMMUNITY): Payer: Self-pay

## 2020-07-09 DIAGNOSIS — F17210 Nicotine dependence, cigarettes, uncomplicated: Secondary | ICD-10-CM | POA: Insufficient documentation

## 2020-07-09 DIAGNOSIS — S40012A Contusion of left shoulder, initial encounter: Secondary | ICD-10-CM | POA: Insufficient documentation

## 2020-07-09 DIAGNOSIS — Y92009 Unspecified place in unspecified non-institutional (private) residence as the place of occurrence of the external cause: Secondary | ICD-10-CM | POA: Insufficient documentation

## 2020-07-09 DIAGNOSIS — S022XXA Fracture of nasal bones, initial encounter for closed fracture: Secondary | ICD-10-CM

## 2020-07-09 DIAGNOSIS — S1093XA Contusion of unspecified part of neck, initial encounter: Secondary | ICD-10-CM | POA: Insufficient documentation

## 2020-07-09 DIAGNOSIS — I10 Essential (primary) hypertension: Secondary | ICD-10-CM | POA: Insufficient documentation

## 2020-07-09 DIAGNOSIS — Z7982 Long term (current) use of aspirin: Secondary | ICD-10-CM | POA: Insufficient documentation

## 2020-07-09 DIAGNOSIS — Z96642 Presence of left artificial hip joint: Secondary | ICD-10-CM | POA: Insufficient documentation

## 2020-07-09 DIAGNOSIS — T07XXXA Unspecified multiple injuries, initial encounter: Secondary | ICD-10-CM

## 2020-07-09 DIAGNOSIS — Z79899 Other long term (current) drug therapy: Secondary | ICD-10-CM | POA: Insufficient documentation

## 2020-07-09 LAB — CBC WITH DIFFERENTIAL/PLATELET
Abs Immature Granulocytes: 0.04 10*3/uL (ref 0.00–0.07)
Basophils Absolute: 0.1 10*3/uL (ref 0.0–0.1)
Basophils Relative: 1 %
Eosinophils Absolute: 0 10*3/uL (ref 0.0–0.5)
Eosinophils Relative: 0 %
HCT: 43.9 % (ref 36.0–46.0)
Hemoglobin: 13.6 g/dL (ref 12.0–15.0)
Immature Granulocytes: 0 %
Lymphocytes Relative: 26 %
Lymphs Abs: 2.5 10*3/uL (ref 0.7–4.0)
MCH: 27.1 pg (ref 26.0–34.0)
MCHC: 31 g/dL (ref 30.0–36.0)
MCV: 87.6 fL (ref 80.0–100.0)
Monocytes Absolute: 0.5 10*3/uL (ref 0.1–1.0)
Monocytes Relative: 5 %
Neutro Abs: 6.4 10*3/uL (ref 1.7–7.7)
Neutrophils Relative %: 68 %
Platelets: 323 10*3/uL (ref 150–400)
RBC: 5.01 MIL/uL (ref 3.87–5.11)
RDW: 16 % — ABNORMAL HIGH (ref 11.5–15.5)
WBC: 9.6 10*3/uL (ref 4.0–10.5)
nRBC: 0 % (ref 0.0–0.2)

## 2020-07-09 LAB — COMPREHENSIVE METABOLIC PANEL
ALT: 18 U/L (ref 0–44)
AST: 16 U/L (ref 15–41)
Albumin: 3.5 g/dL (ref 3.5–5.0)
Alkaline Phosphatase: 163 U/L — ABNORMAL HIGH (ref 38–126)
Anion gap: 13 (ref 5–15)
BUN: 8 mg/dL (ref 6–20)
CO2: 21 mmol/L — ABNORMAL LOW (ref 22–32)
Calcium: 8.4 mg/dL — ABNORMAL LOW (ref 8.9–10.3)
Chloride: 107 mmol/L (ref 98–111)
Creatinine, Ser: 0.53 mg/dL (ref 0.44–1.00)
GFR, Estimated: >60 mL/min (ref >60)
Glucose, Bld: 92 mg/dL (ref 70–99)
Potassium: 4 mmol/L (ref 3.5–5.1)
Sodium: 141 mmol/L (ref 135–145)
Total Bilirubin: 0.3 mg/dL (ref 0.3–1.2)
Total Protein: 6.6 g/dL (ref 6.5–8.1)

## 2020-07-09 LAB — ETHANOL: Alcohol, Ethyl (B): 164 mg/dL — ABNORMAL HIGH (ref <10)

## 2020-07-09 MED ORDER — IBUPROFEN 600 MG PO TABS: 600.0000 mg | ORAL_TABLET | Freq: Four times a day (QID) | ORAL | 0 refills | Status: AC | PRN

## 2020-07-09 MED ORDER — IBUPROFEN 600 MG PO TABS
600.0000 mg | ORAL_TABLET | Freq: Four times a day (QID) | ORAL | 0 refills | Status: DC | PRN
Start: 2020-07-09 — End: 2020-07-09

## 2020-07-09 MED ORDER — ACETAMINOPHEN 500 MG PO TABS
1000.0000 mg | ORAL_TABLET | Freq: Once | ORAL | Status: AC
  Administered 2020-07-09: 1000 mg via ORAL
  Filled 2020-07-09: qty 2

## 2020-07-09 MED ORDER — NAPROXEN 250 MG PO TABS
500.0000 mg | ORAL_TABLET | Freq: Two times a day (BID) | ORAL | Status: DC | PRN
  Administered 2020-07-09: 500 mg via ORAL
  Filled 2020-07-09: qty 2

## 2020-07-09 MED ORDER — HYDROCODONE-ACETAMINOPHEN 5-325 MG PO TABS: 1.0000 | ORAL_TABLET | Freq: Four times a day (QID) | ORAL | 0 refills | Status: AC | PRN

## 2020-07-09 MED ORDER — HYDROCODONE-ACETAMINOPHEN 5-325 MG PO TABS
1.0000 | ORAL_TABLET | Freq: Once | ORAL | Status: AC
  Administered 2020-07-09: 1 via ORAL
  Filled 2020-07-09: qty 1
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
## 2020-07-09 NOTE — ED Notes (Signed)
The pt wants  Something to eat crying  I have no orders
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
## 2020-07-09 NOTE — ED Notes (Signed)
Pt calling for shelter on the phone
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
## 2020-07-11 MED FILL — IBUPROFEN 600 MG TABLET: 600 | 7 days supply | Qty: 30 | Fill #0

## 2020-07-18 MED FILL — TRAZODONE HCL 100 MG TABS: 100 | 30 days supply | Qty: 30 | Fill #6

## 2020-07-18 MED FILL — busPIRone HCL 30 MG TABS: 30 | 30 days supply | Qty: 60 | Fill #6

## 2020-08-24 ENCOUNTER — Ambulatory Visit: Payer: Medicaid Other | Admitting: Nurse Practitioner

## 2020-11-15 ENCOUNTER — Other Ambulatory Visit: Payer: Self-pay | Admitting: Orthopaedic Surgery

## 2020-11-15 ENCOUNTER — Telehealth: Payer: Self-pay | Admitting: Orthopaedic Surgery

## 2020-11-15 NOTE — Telephone Encounter (Signed)
Pt wants to know if she can get a refill on her muscle relaxer. She fell at work and has lots of back pain also its the whole left side. She will be coming in June 3rd to be seen! CB [phone]

## 2020-11-15 NOTE — Telephone Encounter (Signed)
Please advise 

## 2020-11-15 NOTE — Telephone Encounter (Signed)
Tried calling pt to inform. No answer and no voice mail 

## 2020-11-15 NOTE — Telephone Encounter (Signed)
I sent it in . [place] pt thanks

## 2020-12-02 ENCOUNTER — Ambulatory Visit: Payer: Self-pay | Admitting: Orthopaedic Surgery

## 2021-09-11 ENCOUNTER — Encounter: Payer: Self-pay | Admitting: Gastroenterology

## 2022-09-26 NOTE — Progress Notes (Signed)
Erroneous encounter-disregard

## 2022-10-05 ENCOUNTER — Encounter: Payer: Medicaid Other | Admitting: Family

## 2022-10-05 DIAGNOSIS — Z7689 Persons encountering health services in other specified circumstances: Secondary | ICD-10-CM

## 2023-06-07 ENCOUNTER — Other Ambulatory Visit: Payer: Self-pay

## 2023-06-07 ENCOUNTER — Emergency Department (HOSPITAL_COMMUNITY)
Admission: EM | Admit: 2023-06-07 | Discharge: 2023-06-07 | Disposition: A | Discharge status: Home / Self Care | Payer: Medicaid Other | Attending: Emergency Medicine | Admitting: Emergency Medicine

## 2023-06-07 ENCOUNTER — Telehealth (HOSPITAL_COMMUNITY): Payer: Self-pay | Admitting: Emergency Medicine

## 2023-06-07 ENCOUNTER — Emergency Department (HOSPITAL_COMMUNITY): Payer: Medicaid Other

## 2023-06-07 ENCOUNTER — Ambulatory Visit
Admission: EM | Admit: 2023-06-07 | Discharge: 2023-06-07 | Disposition: A | Discharge status: Home / Self Care | Payer: Medicaid Other

## 2023-06-07 ENCOUNTER — Telehealth: Payer: Self-pay

## 2023-06-07 DIAGNOSIS — R109 Unspecified abdominal pain: Secondary | ICD-10-CM | POA: Diagnosis not present

## 2023-06-07 DIAGNOSIS — R1011 Right upper quadrant pain: Secondary | ICD-10-CM

## 2023-06-07 DIAGNOSIS — R112 Nausea with vomiting, unspecified: Secondary | ICD-10-CM | POA: Diagnosis not present

## 2023-06-07 DIAGNOSIS — R1013 Epigastric pain: Secondary | ICD-10-CM | POA: Diagnosis not present

## 2023-06-07 DIAGNOSIS — K828 Other specified diseases of gallbladder: Secondary | ICD-10-CM | POA: Diagnosis not present

## 2023-06-07 DIAGNOSIS — K8020 Calculus of gallbladder without cholecystitis without obstruction: Secondary | ICD-10-CM | POA: Insufficient documentation

## 2023-06-07 DIAGNOSIS — Z7982 Long term (current) use of aspirin: Secondary | ICD-10-CM | POA: Insufficient documentation

## 2023-06-07 DIAGNOSIS — I10 Essential (primary) hypertension: Secondary | ICD-10-CM | POA: Diagnosis not present

## 2023-06-07 DIAGNOSIS — I491 Atrial premature depolarization: Secondary | ICD-10-CM | POA: Diagnosis not present

## 2023-06-07 DIAGNOSIS — K279 Peptic ulcer, site unspecified, unspecified as acute or chronic, without hemorrhage or perforation: Secondary | ICD-10-CM | POA: Insufficient documentation

## 2023-06-07 DIAGNOSIS — R110 Nausea: Secondary | ICD-10-CM | POA: Diagnosis not present

## 2023-06-07 LAB — HEPATIC FUNCTION PANEL
ALT: 15 U/L (ref 0–44)
AST: 17 U/L (ref 15–41)
Albumin: 3.9 g/dL (ref 3.5–5.0)
Alkaline Phosphatase: 108 U/L (ref 38–126)
Bilirubin, Direct: 0.1 mg/dL (ref 0.0–0.2)
Indirect Bilirubin: 0.4 mg/dL (ref 0.3–0.9)
Total Bilirubin: 0.5 mg/dL (ref <1.2)
Total Protein: 6.7 g/dL (ref 6.5–8.1)

## 2023-06-07 LAB — CBC WITH DIFFERENTIAL/PLATELET
Abs Immature Granulocytes: 0.02 10*3/uL (ref 0.00–0.07)
Basophils Absolute: 0.1 10*3/uL (ref 0.0–0.1)
Basophils Relative: 1 %
Eosinophils Absolute: 0.1 10*3/uL (ref 0.0–0.5)
Eosinophils Relative: 1 %
HCT: 40.6 % (ref 36.0–46.0)
Hemoglobin: 13 g/dL (ref 12.0–15.0)
Immature Granulocytes: 0 %
Lymphocytes Relative: 38 %
Lymphs Abs: 2.9 10*3/uL (ref 0.7–4.0)
MCH: 28.8 pg (ref 26.0–34.0)
MCHC: 32 g/dL (ref 30.0–36.0)
MCV: 89.8 fL (ref 80.0–100.0)
Monocytes Absolute: 0.5 10*3/uL (ref 0.1–1.0)
Monocytes Relative: 6 %
Neutro Abs: 4.1 10*3/uL (ref 1.7–7.7)
Neutrophils Relative %: 54 %
Platelets: 219 10*3/uL (ref 150–400)
RBC: 4.52 MIL/uL (ref 3.87–5.11)
RDW: 13.7 % (ref 11.5–15.5)
WBC: 7.6 10*3/uL (ref 4.0–10.5)
nRBC: 0 % (ref 0.0–0.2)

## 2023-06-07 LAB — BASIC METABOLIC PANEL
Anion gap: 7 (ref 5–15)
BUN: 10 mg/dL (ref 6–20)
CO2: 24 mmol/L (ref 22–32)
Calcium: 8.8 mg/dL — ABNORMAL LOW (ref 8.9–10.3)
Chloride: 107 mmol/L (ref 98–111)
Creatinine, Ser: 0.53 mg/dL (ref 0.44–1.00)
GFR, Estimated: >60 mL/min (ref >60)
Glucose, Bld: 101 mg/dL — ABNORMAL HIGH (ref 70–99)
Potassium: 3.7 mmol/L (ref 3.5–5.1)
Sodium: 138 mmol/L (ref 135–145)

## 2023-06-07 LAB — POCT URINALYSIS DIP (MANUAL ENTRY)
Bilirubin, UA: NEGATIVE
Glucose, UA: NEGATIVE mg/dL
Ketones, POC UA: NEGATIVE mg/dL
Leukocytes, UA: NEGATIVE
Nitrite, UA: NEGATIVE
Protein Ur, POC: NEGATIVE mg/dL
Spec Grav, UA: 1.025 (ref 1.010–1.025)
Urobilinogen, UA: 0.2 U/dL
pH, UA: 7.5 (ref 5.0–8.0)

## 2023-06-07 LAB — TYPE AND SCREEN
ABO/RH(D): A POS
Antibody Screen: NEGATIVE

## 2023-06-07 LAB — LIPASE, BLOOD: Lipase: 27 U/L (ref 11–51)

## 2023-06-07 MED ORDER — PANTOPRAZOLE SODIUM 40 MG IV SOLR
80.0000 mg | Freq: Once | INTRAVENOUS | Status: AC
  Administered 2023-06-07: 80 mg via INTRAVENOUS
  Filled 2023-06-07: qty 20

## 2023-06-07 MED ORDER — PANTOPRAZOLE SODIUM 40 MG PO TBEC
40.0000 mg | DELAYED_RELEASE_TABLET | Freq: Every day | ORAL | 0 refills | Status: DC
  Filled 2023-06-07: qty 60, 60d supply, fill #0

## 2023-06-07 MED ORDER — MORPHINE SULFATE (PF) 4 MG/ML IV SOLN
4.0000 mg | Freq: Once | INTRAVENOUS | Status: AC
  Administered 2023-06-07: 4 mg via INTRAVENOUS
  Filled 2023-06-07: qty 1

## 2023-06-07 MED ORDER — SUCRALFATE 1 G PO TABS
1.0000 g | ORAL_TABLET | Freq: Once | ORAL | Status: AC
  Administered 2023-06-07: 1 g via ORAL
  Filled 2023-06-07: qty 1

## 2023-06-07 MED ORDER — MORPHINE SULFATE 15 MG PO TABS
15.0000 mg | ORAL_TABLET | Freq: Once | ORAL | Status: AC
  Administered 2023-06-07: 15 mg via ORAL
  Filled 2023-06-07: qty 1

## 2023-06-07 MED ORDER — SUCRALFATE 1 G PO TABS: 1.0000 g | ORAL_TABLET | Freq: Three times a day (TID) | ORAL | 1 refills | Status: DC

## 2023-06-07 MED ORDER — SUCRALFATE 1 G PO TABS
1.0000 g | ORAL_TABLET | Freq: Three times a day (TID) | ORAL | 1 refills | Status: DC
  Filled 2023-06-07: qty 90, 23d supply, fill #0

## 2023-06-07 MED ORDER — PANTOPRAZOLE SODIUM 40 MG PO TBEC: 40.0000 mg | DELAYED_RELEASE_TABLET | Freq: Every day | ORAL | 0 refills | Status: AC

## 2023-06-07 MED ORDER — PANTOPRAZOLE SODIUM 40 MG PO TBEC: 40.0000 mg | DELAYED_RELEASE_TABLET | Freq: Every day | ORAL | 0 refills | Status: DC

## 2023-06-07 MED ORDER — SUCRALFATE 1 G PO TABS: 1.0000 g | ORAL_TABLET | Freq: Three times a day (TID) | ORAL | 1 refills | Status: AC

## 2023-06-07 NOTE — ED Provider Notes (Signed)
[place] EMERGENCY DEPARTMENT AT [facility] Provider Note   CSN: [id number] Arrival date & time: 06/07/23  9563     History  Chief Complaint  Patient presents with   Abdominal Pain   Nausea   Emesis    Ashley [name] is a 58 y.o. female.  HPI     57 year old female comes in with chief complaint of abdominal pain, nausea, vomiting.  Patient has history of perforated ulcers.  She states that she was admitted to the hospital in 2021 for it and required emergent evaluation.  She subsequently has not followed up with GI, she was doing well.  She has modified her lifestyle, by slowly stopping alcohol and also smoking.  She has been having this current abdominal pain, there is described as sharp pain in the upper quadrants.  Pain is worse in the epigastric region.  Pain used to be postprandial and episodic, but now she has a constant abdominal discomfort with waxing and waning intensity.  At its peak, the pain is excruciating.  Patient denies any associated bloody stools.  She has had couple of episodes of nausea with vomiting in the last 2 days, but the emesis is not coffee colored or bloody.  No melena.  No other abdominal surgical history.  Home Medications Prior to Admission medications   Medication Sig Start Date End Date Taking? Authorizing Provider  amLODipine (NORVASC) 5 MG tablet Take 1 tablet (5 mg total) by mouth daily. 11/10/19   Claiborne Rigg, NP  amLODipine (NORVASC) 5 MG tablet TAKE 1 TABLET (5 MG TOTAL) BY MOUTH DAILY. 11/10/19 11/09/20  Claiborne Rigg, NP  amoxicillin-clavulanate (AUGMENTIN) 875-125 MG tablet Take 1 tablet by mouth 2 (two) times daily. 05/18/20   Hoy Register, MD  Ascorbic Acid (VITAMIN C) 1000 MG tablet Take 1,000 mg by mouth daily.    [provider]  aspirin (BAYER ASPIRIN) 325 MG tablet Take 1 tablet (325 mg total) by mouth daily. Take one daily for one month to decrease blood clot risk 12/15/19   Eldred Manges, MD  busPIRone  (BUSPAR) 15 MG tablet Take 1 tablet (15 mg total) by mouth 2 (two) times daily. 05/18/20   Hoy Register, MD  chlorhexidine (PERIDEX) 0.12 % solution Use as directed 15 mLs in the mouth or throat 2 (two) times daily. PLEASE MAIL Patient taking differently: Use as directed 15 mLs in the mouth or throat daily. PLEASE MAIL 11/10/19   Claiborne Rigg, NP  diphenhydrAMINE-APAP, sleep, (GOODYS PM PO) Take by mouth.    [provider]  hydrochlorothiazide (HYDRODIURIL) 25 MG tablet Take 1 tablet (25 mg total) by mouth daily. 11/10/19   Claiborne Rigg, NP  hydrochlorothiazide (HYDRODIURIL) 25 MG tablet TAKE 1 TABLET (25 MG TOTAL) BY MOUTH DAILY. 11/10/19 11/09/20  Claiborne Rigg, NP  ibuprofen (ADVIL) 600 MG tablet Take 1 tablet (600 mg total) by mouth every 6 (six) hours as needed. 07/09/20   Horton, Clabe Seal, DO  magnesium oxide (MAG-OX) 400 MG tablet Take 400 mg by mouth daily.    [provider]  methocarbamol (ROBAXIN) 500 MG tablet Take 1 tablet (500 mg total) by mouth 4 (four) times daily. 01/05/20   Eldred Manges, MD  methocarbamol (ROBAXIN) 500 MG tablet Take 1 tablet (500 mg total) by mouth every 8 (eight) hours as needed for muscle spasms. 02/24/20   Naida Sleight, PA-C  methocarbamol (ROBAXIN) 500 MG tablet TAKE 1 TABLET (500 MG TOTAL) BY  MOUTH 4 (FOUR) TIMES DAILY. 11/15/20   Eldred Manges, MD  omeprazole (PRILOSEC) 40 MG capsule TAKE 1 CAPSULE (40 MG TOTAL) BY MOUTH 2 (TWO) TIMES DAILY. TAKE 30-60 MINUTES BEFORE BREAKFAST AND DINNER. 06/01/20   Claiborne Rigg, NP  omeprazole (PRILOSEC) 40 MG capsule TAKE 1 CAPSULE (40 MG TOTAL) BY MOUTH 2 (TWO) TIMES DAILY. TAKE 30-60 MINUTES BEFORE BREAKFAST AND DINNER. 06/01/20 06/01/21  Claiborne Rigg, NP  Potassium 99 MG TABS Take 99 mg by mouth daily.    [provider]  traMADol (ULTRAM) 50 MG tablet Take 1 tablet (50 mg total) by mouth 3 (three) times daily as needed. Patient not taking: Reported on 05/24/2020 01/05/20   Eldred Manges, MD  traZODone (DESYREL) 100 MG tablet Take 1 tablet (100 mg total) by mouth at bedtime. 05/18/20 08/16/20  Hoy Register, MD  traZODone (DESYREL) 100 MG tablet TAKE 1 TABLET (100 MG TOTAL) BY MOUTH AT BEDTIME. 11/10/19 11/09/20  Claiborne Rigg, NP      Allergies    Patient has no known allergies.    Review of Systems   Review of Systems  All other systems reviewed and are negative.   Physical Exam Updated Vital Signs BP (!) 149/89   Pulse 63   Temp 97.9 F (36.6 C) (Oral)   Resp 15   Ht 5\' 2"  (1.575 m)   Wt 70.8 kg   LMP  (LMP Unknown)   SpO2 100%   BMI 28.53 kg/m  Physical Exam Vitals and nursing note reviewed.  Constitutional:      Appearance: She is well-developed.  HENT:     Head: Atraumatic.  Cardiovascular:     Rate and Rhythm: Normal rate.  Pulmonary:     Effort: Pulmonary effort is normal.  Abdominal:     Tenderness: There is abdominal tenderness in the right upper quadrant, epigastric area and left upper quadrant. There is guarding. There is no rebound.  Musculoskeletal:     Cervical back: Normal range of motion and neck supple.  Skin:    General: Skin is warm and dry.  Neurological:     Mental Status: She is alert and oriented to person, place, and time.     ED Results / Procedures / Treatments   Labs (all labs ordered are listed, but only abnormal results are displayed) Labs Reviewed  BASIC METABOLIC PANEL - Abnormal; Notable for the following components:      Result Value   Glucose, Bld 101 (*)    Calcium 8.8 (*)    All other components within normal limits  CBC WITH DIFFERENTIAL/PLATELET  HEPATIC FUNCTION PANEL  LIPASE, BLOOD  TYPE AND SCREEN    EKG None  Radiology DG Chest Port 1 View  Result Date: 06/07/2023 CLINICAL DATA:  Abdominal pain. EXAM: PORTABLE CHEST 1 VIEW COMPARISON:  07/09/2020 FINDINGS: The lungs are clear without focal pneumonia, edema, pneumothorax or pleural effusion. The cardiopericardial silhouette is  within normal limits for size. No acute bony abnormality. Telemetry leads overlie the chest. No lucency under either hemidiaphragm. IMPRESSION: 1. No acute cardiopulmonary findings. 2. If there is clinical concern for free air in the abdomen, dedicated two view abdomen series recommended. Electronically Signed   By: Kennith Center M.D.   On: 06/07/2023 11:11    Procedures Procedures    Medications Ordered in ED Medications  pantoprazole (PROTONIX) injection 80 mg (80 mg Intravenous Given 06/07/23 1022)  morphine (PF) 4 MG/ML injection 4 mg (4 mg Intravenous  Given 06/07/23 1023)  sucralfate (CARAFATE) tablet 1 g (1 g Oral Given 06/07/23 1345)  morphine (MSIR) tablet 15 mg (15 mg Oral Given 06/07/23 1335)    ED Course/ Medical Decision Making/ A&P                                 Medical Decision Making Amount and/or Complexity of Data Reviewed Labs: ordered. Radiology: ordered.  Risk Prescription drug management.   This patient presents to the ED with chief complaint(s) of abdominal pain with pertinent past medical history of stomach ulcers with bleeding.The complaint involves an extensive differential diagnosis and also carries with it a high risk of complications and morbidity.    The differential diagnosis includes  Pancreatitis, Hepatobiliary pathology including cholelithiasis and cholecystitis, Gastritis/peptic ulcer disease, small bowel obstruction.  Patient states that her current pain is very similar to her previous PUD pain.  It is worse postprandial.  Patient does not have Murphy sign.  My suspicion is that most likely she is having pain secondary to her peptic ulcer disease.  The initial plan is to get basic labs, give patient IV Protonix and some IV morphine for her severe pain.   Additional history obtained: Records reviewed  I have reviewed patient's previous records including discharge summary from 2021 and EGD from Dr. Adela Lank  Independent labs interpretation:   The following labs were independently interpreted: CMP, Lipase are normal.  CBC is normal.   Treatment and Reassessment: Patient reassessed.  Discussed with her that the lab findings are reassuring.  Patient states that she was feeling comfortable but started having abdominal pain again after she had something to eat.  Consultation: - Consulted or discussed management/test interpretation with external professional: GI doctors.  They are comfortable seeing patient in the clinic for a stat follow-up. I have added ultrasound right upper quadrant given that patient is having postprandial pain and it would be helpful for GI team to know that cholelithiasis have been ruled out.  Final Clinical Impression(s) / ED Diagnoses Final diagnoses:  PUD (peptic ulcer disease)    Rx / DC Orders ED Discharge Orders     None         Derwood Kaplan, MD 06/07/23 1604

## 2023-06-07 NOTE — ED Triage Notes (Signed)
Pt coming from urgent care c/o abd pain, n/v. Pain 4/10 located on right side of abdomen, Symptoms started this Monday. Pt has hx of stomach ulcers that required surgery couple years ago and these are similar symptoms. No blood in stool or emesis.    BP 192/100, HR 192/100 HR 76, Spo2 99% Temp 97.9, CBG 105  20g RAC

## 2023-06-07 NOTE — Discharge Instructions (Addendum)
We suspect that your abdominal pain is because of peptic ulcer disease.  Please read instructions provided.  Start taking the medications that are prescribed.  Follow-up with the GI doctors.  We expect that the GI team will likely call you with an appointment, but if they do not call you by Monday afternoon call the number provided and make sure that you are seen within 2 weeks or for earliest appointment.

## 2023-06-07 NOTE — ED Notes (Signed)
Patient is being discharged from the Urgent Care and sent to the Emergency Department via ambulance . Per provider, patient is in need of higher level of care due to ongoing emesis and abdominal pain. Patient is aware and verbalizes understanding of plan of care. Spoke with nonemergency EMS line at 0830 for transport. Vitals:   06/07/23 0817  BP: (!) 181/105  Pulse: 77  Resp: 18  Temp: 98.2 F (36.8 C)  SpO2: 99%

## 2023-06-07 NOTE — ED Triage Notes (Signed)
"  Years ago I had Ulcers and they were repaired/removed, I went a long time without any symptoms but now in the past 2wks, beginning to have Abd pain again". Some nausa, vomiting 1x last night. No dysuria. No blood in stool. No blood seen in emesis.

## 2023-06-07 NOTE — Telephone Encounter (Signed)
-----   Message from Doree Albee sent at 06/07/2023  4:28 PM EST ----- Regarding: FW: Please set up a hospital follow up! Thanks! Thanks! ----- Message ----- From: Doree Albee, PA-C Sent: 06/07/2023   1:23 PM EST To: Missy Sabins, RN Subject: Please set up a hospital follow up! Thanks!    Patient of Dr. 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  Presented to ER 12/6 with epigastric pain severe, nausea nonbloody emesis. Labs without anemia, normal kidney without elevation of BUN.  Chest x-ray without evidence of free air. Please schedule for follow-up with Dr. Adela Lank or first available app, never had H. pylori eradication study. Marchelle Folks

## 2023-06-07 NOTE — ED Provider Notes (Addendum)
Signout from Dr. Rhunette Croft.  History of peptic ulcer disease and having recurrent pain similar to same.  Labs okay.  Set up for GI outpatient follow-up.  She is pending a right upper quadrant ultrasound to make sure that there is not cholecystitis. Physical Exam  BP (!) 149/89   Pulse 63   Temp 97.9 F (36.6 C) (Oral)   Resp 15   Ht 5\' 2"  (1.575 m)   Wt 70.8 kg   LMP  (LMP Unknown)   SpO2 100%   BMI 28.53 kg/m   Physical Exam  Procedures  Procedures  ED Course / MDM    Medical Decision Making Amount and/or Complexity of Data Reviewed Labs: ordered. Radiology: ordered.  Risk Prescription drug management.   Patient's ultrasound shows gallstones but no evidence of cholecystitis.  Will proceed with plan for discharge and outpatient follow-up with GI.  I switched over her prescriptions to [facility] in [place].     Terrilee Files, MD 06/07/23 1624    Terrilee Files, MD 06/07/23 [phone]

## 2023-06-07 NOTE — ED Notes (Signed)
Called lab to add hepatic panel and lipase to previous samples sent.

## 2023-06-07 NOTE — Telephone Encounter (Signed)
Patient states pharmacy has not received the medications that were ordered.  I am going to resend them.

## 2023-06-07 NOTE — Telephone Encounter (Signed)
Pt scheduled to see Quentin Mulling PA 06/17/23 at 11am. Unable to reach pt or leave a message. Will try again.

## 2023-06-07 NOTE — ED Provider Notes (Signed)
Patient here today for evaluation of right upper quadrant pain.  She reports that she has had pain for the last 2 weeks that worsened last night.  She did have nausea and vomiting last night.  Blood pressure is also significantly elevated in office.  Recommended further evaluation in the emergency room.  Patient does not have current transportation, offered EMS transport which patient is agreeable to.   Tomi Bamberger, PA-C 06/07/23 [phone]

## 2023-06-10 ENCOUNTER — Other Ambulatory Visit: Payer: Self-pay

## 2023-06-10 ENCOUNTER — Telehealth: Payer: Self-pay

## 2023-06-10 NOTE — Telephone Encounter (Signed)
-----   Message from Doree Albee sent at 06/07/2023  4:28 PM EST ----- Regarding: FW: Please set up a hospital follow up! Thanks! Thanks! ----- Message ----- From: Doree Albee, PA-C Sent: 06/07/2023   1:23 PM EST To: Missy Sabins, RN Subject: Please set up a hospital follow up! Thanks!    Patient of Dr. 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  Presented to ER 12/6 with epigastric pain severe, nausea nonbloody emesis. Labs without anemia, normal kidney without elevation of BUN.  Chest x-ray without evidence of free air. Please schedule for follow-up with Dr. Adela Lank or first available app, never had H. pylori eradication study. Marchelle Folks

## 2023-06-10 NOTE — Telephone Encounter (Signed)
Pt is scheduled to see Quentin Mulling PA 06/17/23 at 11AM.

## 2023-06-12 NOTE — Telephone Encounter (Signed)
Attempted to call pt. Phone rang and rang, no answer and unable to leave a message. Will try again later.

## 2023-06-12 NOTE — Telephone Encounter (Signed)
Pt states her stool was black today. Asked pt if she has been taking pepto bismol, pt states she has taken a whole bottle over the weekend. Discussed with her that the pepto can make her stools black. She verbalized understanding and knows to keep her OV as scheduled.

## 2023-06-12 NOTE — Telephone Encounter (Signed)
Inbound call from patient requesting a call to discuss if she is going to get a antibiotic. Patient states that this morning she went to the bathroom and has black stools. Patient stated she called at 7:30 this morning and spoke with someone and they stated they would send a message.  Please advise.

## 2023-06-17 ENCOUNTER — Ambulatory Visit: Payer: Medicaid Other | Admitting: Physician Assistant

## 2023-06-18 ENCOUNTER — Ambulatory Visit: Payer: Medicaid Other | Admitting: Gastroenterology

## 2023-06-18 NOTE — Progress Notes (Deleted)
Chief Complaint: Epigastric pain Primary GI MD: Dr. Adela Lank  HPI: 57 year old female history of H. pylori gastritis, PUD, presents for hospital follow-up  History of GI bleed hospitalized September 2020 in which EGD showed esophagitis, clotted blood in the entire stomach, multiple clean-based gastric ulcers, large cratered ulcer at the pylorus extending into the bulb with adherent clot.  Biopsies positive for H. pylori.  Patient was given amoxicillin, clarithromycin, omeprazole, and fluconazole.  At last office visit December 2020 patient was recommended to have repeat EGD.  Appears this was not pursued.  Recent visit to ED 06/07/2023 for epigastric pain with associated nausea and vomiting.  RUQ ultrasound showed gallstones without ductal dilation or evidence of acute cholelithiasis.  Liver cyst measuring 2.9 cm priorly seen on CT.  Lipase, hepatic function panel, CBC, BMP all within normal limits    PREVIOUS GI WORKUP     Past Medical History:  Diagnosis Date   Anxiety    Chronic left-sided back pain    Depression    GERD (gastroesophageal reflux disease)    Hypertension     Past Surgical History:  Procedure Laterality Date   ESOPHAGOGASTRODUODENOSCOPY (EGD) WITH PROPOFOL N/A 03/15/2019   Procedure: ESOPHAGOGASTRODUODENOSCOPY (EGD) WITH PROPOFOL;  Surgeon: Benancio Deeds, MD;  Location: [facility];  Service: Gastroenterology;  Laterality: N/A;   IR ANGIOGRAM SELECTIVE EACH ADDITIONAL VESSEL  03/15/2019   IR ANGIOGRAM SELECTIVE EACH ADDITIONAL VESSEL  03/15/2019   IR ANGIOGRAM VISCERAL SELECTIVE  03/15/2019   IR EMBO ART  VEN HEMORR LYMPH EXTRAV  INC GUIDE ROADMAPPING  03/15/2019   IR US GUIDE VASC ACCESS RIGHT  03/15/2019   TOTAL HIP ARTHROPLASTY Left 12/14/2019   Procedure: LEFT TOTAL HIP ARTHROPLASTY-DIRECT ANTERIOR;  Surgeon: Eldred Manges, MD;  Location: [facility] OR;  Service: Orthopedics;  Laterality: Left;   TUBAL LIGATION      Current Outpatient Medications   Medication Sig Dispense Refill   amLODipine (NORVASC) 5 MG tablet Take 1 tablet (5 mg total) by mouth daily. (Patient not taking: Reported on 06/07/2023) 90 tablet 3   Ascorbic Acid (VITAMIN C) 1000 MG tablet Take 1,000 mg by mouth daily. (Patient not taking: Reported on 06/07/2023)     aspirin (BAYER ASPIRIN) 325 MG tablet Take 1 tablet (325 mg total) by mouth daily. Take one daily for one month to decrease blood clot risk (Patient not taking: Reported on 06/07/2023)     bismuth subsalicylate (PEPTO BISMOL) 262 MG/15ML suspension Take 30 mLs by mouth every 6 (six) hours as needed for indigestion.     diphenhydrAMINE-APAP, sleep, (GOODYS PM PO) Take 1 packet by mouth as needed (headache).     hydrochlorothiazide (HYDRODIURIL) 25 MG tablet Take 1 tablet (25 mg total) by mouth daily. (Patient not taking: Reported on 06/07/2023) 90 tablet 3   ibuprofen (ADVIL) 600 MG tablet Take 1 tablet (600 mg total) by mouth every 6 (six) hours as needed. (Patient not taking: Reported on 06/07/2023) 30 tablet 0   magnesium oxide (MAG-OX) 400 MG tablet Take 400 mg by mouth daily. (Patient not taking: Reported on 06/07/2023)     pantoprazole (PROTONIX) 40 MG tablet Take 1 tablet (40 mg total) by mouth daily. 60 tablet 0   Potassium 99 MG TABS Take 99 mg by mouth daily. (Patient not taking: Reported on 06/07/2023)     sucralfate (CARAFATE) 1 g tablet Take 1 tablet (1 g total) by mouth 4 (four) times daily -  with meals and at bedtime. 90 tablet 1  traZODone (DESYREL) 100 MG tablet Take 1 tablet (100 mg total) by mouth at bedtime. (Patient not taking: Reported on 06/07/2023) 30 tablet 3   No current facility-administered medications for this visit.    Allergies as of 06/18/2023   (No Known Allergies)    Family History  Problem Relation Age of Onset   Stomach cancer Mother    Colon cancer Father    Stomach cancer Sister    Pancreatic cancer Neg Hx    Esophageal cancer Neg Hx     Social History   Socioeconomic  History   Marital status: Divorced    Spouse name: Not on file   Number of children: Not on file   Years of education: Not on file   Highest education level: Not on file  Occupational History   Not on file  Tobacco Use   Smoking status: Every Day    Current packs/day: 1.00    Average packs/day: 1 pack/day for 26.0 years (26.0 ttl pk-yrs)    Types: Cigarettes   Smokeless tobacco: Never  Vaping Use   Vaping status: Never Used  Substance and Sexual Activity   Alcohol use: No   Drug use: No   Sexual activity: Yes  Other Topics Concern   Not on file  Social History Narrative   Not on file   Social Drivers of Health   Financial Resource Strain: Not on file  Food Insecurity: Not on file  Transportation Needs: Not on file  Physical Activity: Not on file  Stress: Not on file  Social Connections: Not on file  Intimate Partner Violence: Not on file    Review of Systems:    Constitutional: No weight loss, fever, chills, weakness or fatigue HEENT: Eyes: No change in vision               Ears, Nose, Throat:  No change in hearing or congestion Skin: No rash or itching Cardiovascular: No chest pain, chest pressure or palpitations   Respiratory: No SOB or cough Gastrointestinal: See HPI and otherwise negative Genitourinary: No dysuria or change in urinary frequency Neurological: No headache, dizziness or syncope Musculoskeletal: No new muscle or joint pain Hematologic: No bleeding or bruising Psychiatric: No history of depression or anxiety    Physical Exam:  Vital signs: LMP  (LMP Unknown)   Constitutional: NAD, Well developed, Well nourished, alert and cooperative Head:  Normocephalic and atraumatic. Eyes:   PEERL, EOMI. No icterus. Conjunctiva pink. Respiratory: Respirations even and unlabored. Lungs clear to auscultation bilaterally.   No wheezes, crackles, or rhonchi.  Cardiovascular:  Regular rate and rhythm. No peripheral edema, cyanosis or pallor.  Gastrointestinal:   Soft, nondistended, nontender. No rebound or guarding. Normal bowel sounds. No appreciable masses or hepatomegaly. Rectal:  Not performed.  Msk:  Symmetrical without gross deformities. Without edema, no deformity or joint abnormality.  Neurologic:  Alert and  oriented x4;  grossly normal neurologically.  Skin:   Dry and intact without significant lesions or rashes. Psychiatric: Oriented to person, place and time. Demonstrates good judgement and reason without abnormal affect or behaviors.   RELEVANT LABS AND IMAGING: CBC    Component Value Date/Time   WBC 7.6 06/07/2023 1020   RBC 4.52 06/07/2023 1020   HGB 13.0 06/07/2023 1020   HGB 13.7 12/08/2019 1547   HCT 40.6 06/07/2023 1020   HCT 41.6 12/08/2019 1547   PLT 219 06/07/2023 1020   PLT 308 12/08/2019 1547   MCV 89.8 06/07/2023 1020   MCV  88 12/08/2019 1547   MCH 28.8 06/07/2023 1020   MCHC 32.0 06/07/2023 1020   RDW 13.7 06/07/2023 1020   RDW 12.2 12/08/2019 1547   LYMPHSABS 2.9 06/07/2023 1020   LYMPHSABS 3.0 04/13/2019 1351   MONOABS 0.5 06/07/2023 1020   EOSABS 0.1 06/07/2023 1020   EOSABS 0.1 04/13/2019 1351   BASOSABS 0.1 06/07/2023 1020   BASOSABS 0.1 04/13/2019 1351    CMP     Component Value Date/Time   NA 138 06/07/2023 1020   NA 141 12/08/2019 1547   K 3.7 06/07/2023 1020   CL 107 06/07/2023 1020   CO2 24 06/07/2023 1020   GLUCOSE 101 (H) 06/07/2023 1020   BUN 10 06/07/2023 1020   BUN 13 12/08/2019 1547   CREATININE 0.53 06/07/2023 1020   CALCIUM 8.8 (L) 06/07/2023 1020   PROT 6.7 06/07/2023 1020   PROT 6.7 12/08/2019 1547   ALBUMIN 3.9 06/07/2023 1020   ALBUMIN 4.6 12/08/2019 1547   AST 17 06/07/2023 1020   ALT 15 06/07/2023 1020   ALKPHOS 108 06/07/2023 1020   BILITOT 0.5 06/07/2023 1020   BILITOT <0.2 12/08/2019 1547   GFRNONAA >60 06/07/2023 1020   GFRAA >60 12/15/2019 1610     Assessment/Plan:       Lara Mulch [facility] 06/18/2023, 9:08 AM  Cc:  Claiborne Rigg, NP
